# Patient Record
Sex: Male | Born: 1960 | Race: White | Hispanic: No | Marital: Married | Smoking: Current every day smoker
Health system: Southern US, Community
[De-identification: ages and names within clinical notes are randomized; demographics above are authoritative.]

## PROBLEM LIST (undated history)

## (undated) DIAGNOSIS — R42 Dizziness and giddiness: Secondary | ICD-10-CM

## (undated) DIAGNOSIS — M1990 Unspecified osteoarthritis, unspecified site: Secondary | ICD-10-CM

## (undated) DIAGNOSIS — E119 Type 2 diabetes mellitus without complications: Secondary | ICD-10-CM

## (undated) DIAGNOSIS — K219 Gastro-esophageal reflux disease without esophagitis: Secondary | ICD-10-CM

## (undated) DIAGNOSIS — J45909 Unspecified asthma, uncomplicated: Secondary | ICD-10-CM

## (undated) DIAGNOSIS — R079 Chest pain, unspecified: Secondary | ICD-10-CM

## (undated) DIAGNOSIS — E785 Hyperlipidemia, unspecified: Secondary | ICD-10-CM

## (undated) DIAGNOSIS — I219 Acute myocardial infarction, unspecified: Secondary | ICD-10-CM

## (undated) DIAGNOSIS — T7840XA Allergy, unspecified, initial encounter: Secondary | ICD-10-CM

## (undated) HISTORY — DX: Hyperlipidemia, unspecified: E78.5

## (undated) HISTORY — DX: Gastro-esophageal reflux disease without esophagitis: K21.9

## (undated) HISTORY — DX: Acute myocardial infarction, unspecified: I21.9

## (undated) HISTORY — DX: Allergy, unspecified, initial encounter: T78.40XA

## (undated) HISTORY — DX: Unspecified asthma, uncomplicated: J45.909

## (undated) HISTORY — PX: HERNIA REPAIR: SHX51

## (undated) HISTORY — PX: ESOPHAGOGASTRODUODENOSCOPY: SHX1529

## (undated) HISTORY — DX: Unspecified osteoarthritis, unspecified site: M19.90

---

## 2012-07-01 DIAGNOSIS — I219 Acute myocardial infarction, unspecified: Secondary | ICD-10-CM

## 2012-07-01 HISTORY — PX: CORONARY STENT INTERVENTION: CATH118234

## 2012-07-01 HISTORY — PX: CARDIAC CATHETERIZATION: SHX172

## 2012-07-01 HISTORY — DX: Acute myocardial infarction, unspecified: I21.9

## 2013-02-05 ENCOUNTER — Ambulatory Visit: Payer: Self-pay | Admitting: Family Medicine

## 2013-02-19 ENCOUNTER — Ambulatory Visit: Payer: Self-pay | Admitting: Family Medicine

## 2013-02-27 ENCOUNTER — Inpatient Hospital Stay: Payer: Self-pay | Admitting: Specialist

## 2013-02-27 LAB — CBC WITH DIFFERENTIAL/PLATELET
Eosinophil #: 0.4 10*3/uL (ref 0.0–0.7)
Eosinophil %: 3.5 %
HGB: 15.4 g/dL (ref 13.0–18.0)
Lymphocyte #: 4.4 10*3/uL — ABNORMAL HIGH (ref 1.0–3.6)
MCHC: 35.3 g/dL (ref 32.0–36.0)
Neutrophil #: 5.2 10*3/uL (ref 1.4–6.5)
Neutrophil %: 47.6 %
Platelet: 290 10*3/uL (ref 150–440)
RBC: 4.76 10*6/uL (ref 4.40–5.90)
RDW: 12.1 % (ref 11.5–14.5)
WBC: 10.9 10*3/uL — ABNORMAL HIGH (ref 3.8–10.6)

## 2013-02-27 LAB — URINALYSIS, COMPLETE
Bacteria: NONE SEEN
Bilirubin,UR: NEGATIVE
Blood: NEGATIVE
Glucose,UR: NEGATIVE mg/dL (ref 0–75)
Ketone: NEGATIVE
Ph: 6 (ref 4.5–8.0)
Protein: NEGATIVE
Squamous Epithelial: NONE SEEN

## 2013-02-27 LAB — COMPREHENSIVE METABOLIC PANEL
Albumin: 3.9 g/dL (ref 3.4–5.0)
Anion Gap: 11 (ref 7–16)
Bilirubin,Total: 0.4 mg/dL (ref 0.2–1.0)
Chloride: 100 mmol/L (ref 98–107)
Co2: 25 mmol/L (ref 21–32)
Creatinine: 0.98 mg/dL (ref 0.60–1.30)
Glucose: 176 mg/dL — ABNORMAL HIGH (ref 65–99)
Osmolality: 276 (ref 275–301)
Potassium: 3 mmol/L — ABNORMAL LOW (ref 3.5–5.1)
SGOT(AST): 40 U/L — ABNORMAL HIGH (ref 15–37)
Sodium: 136 mmol/L (ref 136–145)
Total Protein: 7.4 g/dL (ref 6.4–8.2)

## 2013-02-27 LAB — LIPID PANEL: Ldl Cholesterol, Calc: 135 mg/dL — ABNORMAL HIGH (ref 0–100)

## 2013-02-27 LAB — TROPONIN I
Troponin-I: <0.02 ng/mL
Troponin-I: 2.2 ng/mL — ABNORMAL HIGH
Troponin-I: 1.7 ng/mL — ABNORMAL HIGH

## 2013-02-27 LAB — PROTIME-INR
INR: 0.9
Prothrombin Time: 12.8 secs (ref 11.5–14.7)

## 2013-02-27 LAB — CK TOTAL AND CKMB (NOT AT ARMC)
CK, Total: 249 U/L — ABNORMAL HIGH (ref 35–232)
CK, Total: 251 U/L — ABNORMAL HIGH (ref 35–232)
CK, Total: 242 U/L — ABNORMAL HIGH (ref 35–232)
CK-MB: 0.6 ng/mL (ref 0.5–3.6)
CK-MB: 4.4 ng/mL — ABNORMAL HIGH (ref 0.5–3.6)
CK-MB: 3.7 ng/mL — ABNORMAL HIGH (ref 0.5–3.6)

## 2013-02-28 LAB — WBC: WBC: 10.2 10*3/uL (ref 3.8–10.6)

## 2013-02-28 LAB — LIPID PANEL
Ldl Cholesterol, Calc: 122 mg/dL — ABNORMAL HIGH (ref 0–100)
VLDL Cholesterol, Calc: 49 mg/dL — ABNORMAL HIGH (ref 5–40)

## 2013-03-02 LAB — BASIC METABOLIC PANEL
Anion Gap: 9 (ref 7–16)
BUN: 13 mg/dL (ref 7–18)
Calcium, Total: 8.8 mg/dL (ref 8.5–10.1)
Co2: 24 mmol/L (ref 21–32)
Creatinine: 0.78 mg/dL (ref 0.60–1.30)
EGFR (African American): >60
EGFR (Non-African Amer.): >60
Osmolality: 268 (ref 275–301)
Potassium: 3.8 mmol/L (ref 3.5–5.1)
Sodium: 134 mmol/L — ABNORMAL LOW (ref 136–145)

## 2014-10-21 NOTE — Discharge Summary (Signed)
PATIENT NAME:  Clifford Morse, Clifford Morse MR#:  [id number] DATE OF BIRTH:  [birth year]  DATE OF ADMISSION:  02/27/2013 DATE OF DISCHARGE:  03/03/2013  For a detailed note, please take a look at the history and physical on admission by Dr. Valentino Nose.   DIAGNOSES AT DISCHARGE: Acute non-Q-wave myocardial infarction. Chest pain secondary to the non-Q-wave myocardial infarction. Hypertension. Hyperlipidemia. Depression. Hypokalemia.   DIET: The patient is being discharged on a low-sodium, low-fat diet.   ACTIVITY: As tolerated.   FOLLOWUP: With Dr. Miquel Dunn in the next 1 to 2 weeks. Also follow up with Dr. Larene Beach in the next 1 to 2  weeks.   DISCHARGE MEDICATIONS: Hydrochlorothiazide/lisinopril 12.5/20 mg 1 tab daily, omeprazole 20 mg daily, atenolol 100 mg daily, Cymbalta 30 mg daily, Brilinta 90 mg b.i.d., atorvastatin 40 mg daily and aspirin 81 mg daily.   CONSULTANTS DURING THE HOSPITAL COURSE: Dr. Miquel Dunn from cardiology.   PERTINENT STUDIES DONE DURING THE HOSPITAL COURSE: A CT scan of the chest done with contrast on admission showing no evidence of acute pulmonary embolism, no mediastinal or hilar lymphadenopathy, no evidence of congestive heart failure. A chest x-ray done showing no evidence of acute cardiopulmonary disease.   A cardiac catheterization done on 03/02/2013 showing an 85% stenosis to the mid LAD, status post drug-eluting stent placement; n 50% stenosis of the distal LAD.   A 2-dimensional echocardiogram done showing ejection fraction to be 60% to 65%, mildly increased left ventricular posterior wall thickness.   HOSPITAL COURSE: This is a 54 year old male with medical problems as mentioned above, presented to the hospital on 02/27/2013 secondary to chest pain.  1.  Non-Q-wave MI:  This was the likely cause of the patient's chest pain, as patient ruled in by cardiac markers while in the hospital. The patient presented to the hospital on August 30, had  first set of  troponins checked which was negative. The second set was as high as 2.2. A cardiology consult was obtained. The patient was started on Lovenox, maintained on some aspirin, beta blockers and also a statin. After seen by cardiology, the patient underwent a cardiac catheterization on the morning of 03/02/2013 which showed a mid LAD 85% lesion, to which a drug-eluting stent was placed. Post stent placement, the patient has been chest pain-free and hemodynamically stable. He was therefore discharged on aspirin, Brilinta, a beta blocker, statin and ACE inhibitor as mentioned with close followup with cardiology as an outpatient.  2.  Hypertension: The patient remained hemodynamically stable. He was maintained on his combination hydrochlorothiazide/lisinopril along with his metoprolol, and he will resume those upon discharge.  3.  Hyperlipidemia: The patient was not on a statin prior to coming in, although due to his acute MI and his lipid panel being grossly abnormal with a total cholesterol over 200 and LDL over 120, he was discharged on atorvastatin as mentioned.  4.  Depression: The patient was maintained on his Cymbalta, and he will resume that upon discharge.   CODE STATUS: The patient is a full code.   TIME SPENT: 40 minutes.   ____________________________ Belia Heman. Verdell Carmine, MD vjs:jm D: 03/03/2013 16:03:41 ET T: 03/03/2013 16:36:40 ET JOB#: [id number]  cc: Belia Heman. Verdell Carmine, MD, <Dictator> Isaias Cowman, MD Arlis Porta., MD Henreitta Leber MD ELECTRONICALLY SIGNED 03/13/2013 15:09

## 2014-10-21 NOTE — Consult Note (Signed)
PATIENT NAME:  Clifford Morse, Clifford Morse MR#:  [id number] DATE OF BIRTH:  1960/11/11  DATE OF CONSULTATION:  02/27/2013  CONSULTING PHYSICIAN:  Isaias Cowman, MD  PRIMARY CARE PHYSICIAN: Arlis Porta., MD  CHIEF COMPLAINT: Chest pain.   HISTORY OF PRESENT ILLNESS: The patient is a 54 year old gentleman with history of hypertension and tobacco abuse. The patient has known gastroesophageal reflux disease, currently on omeprazole. The patient has had a 2- to 3-week history of intermittent episodes of upper chest discomfort which appear to be related to meals. On 02/26/2013, the patient had dull upper chest pain after eating toast. The patient took an omeprazole without relief and presented to [facility] Emergency Room. EKG was normal sinus rhythm, normal ECG. The patient has ruled out for myocardial infarction with negative troponin and cardiac isoenzymes. The patient is currently chest pain-free.   PAST MEDICAL HISTORY:  1. Hypertension.  2. Gastroesophageal reflux disease.  3. Fatty liver. 4. Depression.   MEDICATIONS:  1. Lisinopril/HCTZ 10/12.5 mg daily.  2. Atenolol 100 mg daily. 3. Prilosec 20 mg daily.  4. Duloxetine 30 mg daily.   SOCIAL HISTORY: The patient is married. He smokes a pack of cigarettes a day.   FAMILY HISTORY: Sister status post MI at age 53.   REVIEW OF SYSTEMS:   CONSTITUTIONAL: No fever or chills.  EYES: No blurry vision.  EARS: No hearing loss.  RESPIRATORY: No shortness of breath.  CARDIOVASCULAR: Chest pain as described above.  GASTROINTESTINAL: No nausea, vomiting or diarrhea.  GENITOURINARY: No dysuria or hematuria.  ENDOCRINE: No polyuria or polydipsia.  MUSCULOSKELETAL: No arthralgias or myalgias.  NEUROLOGICAL: No focal muscle weakness or numbness.  PSYCHOLOGICAL: No depression or anxiety.   PHYSICAL EXAMINATION:  VITAL SIGNS: Blood pressure 124/78, pulse 70, respirations 20, temperature 98.8, pulse oximetry 97%.  HEENT: Pupils equally reactive to  light and accommodation.  NECK: Supple without thyromegaly.  LUNGS: Clear.  CARDIOVASCULAR: Normal JVP. Normal PMI. Regular rate and rhythm. Normal S1, S2. No appreciable gallop, murmur or rub.  ABDOMEN: Soft and nontender. Pulses were intact bilaterally.  MUSCULOSKELETAL: Normal muscle tone.  NEUROLOGICAL: The patient is alert and oriented x3. Motor and sensory both grossly intact.   IMPRESSION: A 54 year old gentleman with chest pain with atypical features, which sounds gastrointestinal in nature. The patient has ruled out for myocardial infarction by CPK, isoenzymes and troponin. EKG is normal.   RECOMMENDATIONS:  1. Agree with overall current therapy.  2. Would defer full-dose anticoagulation.  3. May proceed with outpatient cardiac workup.   ____________________________ Isaias Cowman, MD ap:OSi D: 02/27/2013 08:59:47 ET T: 02/27/2013 09:30:43 ET JOB#: [id number]  cc: Isaias Cowman, MD, <Dictator> Isaias Cowman MD ELECTRONICALLY SIGNED 03/11/2013 7:59

## 2014-10-21 NOTE — H&P (Signed)
PATIENT NAME:  Clifford Morse, Clifford Morse MR#:  [id number] DATE OF BIRTH:  01-06-1961  DATE OF ADMISSION:  02/27/2013  PRIMARY CARE PHYSICIAN: Arlis Porta., MD  REFERRING PHYSICIAN: Valli Glance. Owens Shark, MD  HISTORY OF PRESENT ILLNESS: Clifford Morse is a 54 year old Caucasian gentleman with past medical history of hypertension, hyperlipidemia, hepatic steatosis, tobacco abuse of 15 pack-years, gastroesophageal reflux disease, presenting with retrosternal chest pain. He has been having episodes of intermittent retrosternal chest pain described as a dull ache for the last 1 month on exertion, relieved by rest as well as belching. However, today, he had similar pain at rest after he ate some toast, pain 4 to 5 in intensity, which he described as a dull ache with radiation to the back, lasting approximately 1 hour, relieved with nitroglycerin by EMS. Pain has resolved completely by presentation to the Emergency Department. This episode was associated with diaphoresis and shortness of breath. Currently, in the ED, all symptoms have resolved. However, on exam, noted a difference in bilateral upper extremity blood pressure, left 160/84, right 138/37. CT performed in the Emergency Department to rule out aortic dissection.  REVIEW OF SYSTEMS:  CONSTITUTIONAL: Denies any fevers, chills, weight changes.  EYES: Denies any vision changes. ENT: Mouth: No oral lesions or ulcers.  CARDIOVASCULAR: As described above.  RESPIRATORY: Shortness of breath as described above. Denies any cough.  GASTROINTESTINAL: Denies any nausea, vomiting, diarrhea, constipation. GENITOURINARY: Denies any dysuria or increased urinary frequency.  MUSCULOSKELETAL: Denies any weakness or pain. SKIN: Denies any lesions or rash.  NEUROLOGICALLY: No headache, paresthesias or paralysis.  PSYCHIATRIC: No depressive symptoms or anxiety.  ENDOCRINE: No heat or cold intolerance. No nocturia. No thyroid problems. HEMATOLOGIC AND LYMPHATIC: No increased  bleeding or easy bruisability.  ALLERGY AND IMMUNOLOGY: No active symptoms.  Otherwise, full review of systems performed and is negative.  PAST MEDICAL HISTORY:  1. Hypertension.  2. GERD. 3. Hepatic steatosis. 4. Depression.   ALLERGIES: No known drug allergies.   FAMILY HISTORY: Significant for sister with myocardial infarction at age 64.   SOCIAL HISTORY: Current everyday smoker with 15-pack-year history. Occasional alcohol usage.   HOME MEDICATIONS: Include:  1. Lisinopril/hydrochlorothiazide 10/12.5 mg p.o. daily. 2. Prilosec 20 mg p.o. daily. 3. Duloxetine 30 mg p.o. daily.  4. Atenolol 100 mg p.o. daily   PHYSICAL EXAMINATION:  VITAL SIGNS: Temperature 98, pulse 72, respirations 14, blood pressure 156/88. This was repeated in the left arm 160/84, right arm 139/87, pulse oximetry 100% on 2 liters nasal cannula. GENERAL: In no acute distress, awake, alert and oriented x3.  HEENT: Normocephalic, atraumatic. Extraocular muscles intact. Pupils equal, round and reactive to light as well as accommodation. Moist mucosal membranes.  NECK: No thyromegaly noted.  CARDIOVASCULAR: S1, S2, regular rate and rhythm. No murmurs, rubs or gallops. Chest is nontender to palpation.  PULMONARY: Clear to auscultation bilaterally without wheezes, rubs or rhonchi.  ABDOMEN: Obese, soft, nontender, nondistended, with positive bowel sounds.  EXTREMITIES: Reveal no cyanosis, edema or clubbing.  NEUROLOGICALLY: Cranial nerves II through XII intact. No gross neurological deficits.   LABORATORY DATA: Sodium 136, potassium 3, chloride 100, bicarbonate 25, BUN 13, creatinine 0.98, glucose 176, total protein 7.4, albumin 3.9, bilirubin 0.4, alkaline phosphatase 104, AST 40, ALT 73. CK 249, CK-MB 0.6, troponin I less than 0.02. WBC 10.9, hemoglobin 15.4, platelets 290. INR is 0.9. Chest x-ray: No acute cardiopulmonary process. CT angio of the chest: No dissection or aneurysm, though there is mention of coronary  artery atherosclerotic  calcification as well as hepatic steatosis. EKG: Normal sinus rhythm with heart rate of 80. No ST or T abnormalities.   ASSESSMENT AND PLAN: This is a 54 year old Caucasian gentleman with past medical history of hypertension, hepatic steatosis, tobacco abuse for 15 years as well as gastroesophageal reflux disease, presenting with retrosternal chest pain. He has been having intermittent episodes of retrosternal chest pain with exertion, described as a dull ache, for the last 1 month. He walks approximately 100 feet before symptom onset, and it resolves after a few minutes of rest. However, today, the day of admission, he had an episode at rest after eating, and pain was 4 to 5 out of 10 in intensity with radiation to the back, lasting 1 hour, with associated diaphoresis and shortness of breath. Currently, EKG as well as cardiac enzymes revealing no abnormalities.   1. Unstable angina. Given nitroglycerin and aspirin by EMS. EKG in the Emergency Department is normal sinus rhythm at 80 with no ST or T abnormalities. Initial enzymes are negative. He is going to be admitted to observation with cardiac telemetry, trending cardiac enzymes. He has been given a statin and beta blocker. Lipid panel was ordered plus cardiology consult.  2. Hypokalemia. Potassium replaced. 3. Hypertension. Resume his home medications, though opting to change atenolol to metoprolol.  4. Hepatic steatosis which is chronic in nature.  5. Gastroesophageal reflux disease. Continue Prilosec.  6. Depression. Continue duloxetine.   CODE STATUS: The patient is full code.  TIME SPENT: 45 minutes.   ____________________________ Aaron Mose. Cherisse Carrell, MD dkh:OSi D: 02/27/2013 05:03:19 ET T: 02/27/2013 06:09:43 ET JOB#: [id number]  cc: Aaron Mose. Nikeia Henkes, MD, <Dictator> Dula Havlik Woodfin Ganja MD ELECTRONICALLY SIGNED 02/27/2013 21:11

## 2015-05-19 DIAGNOSIS — K219 Gastro-esophageal reflux disease without esophagitis: Secondary | ICD-10-CM | POA: Insufficient documentation

## 2015-05-19 DIAGNOSIS — K760 Fatty (change of) liver, not elsewhere classified: Secondary | ICD-10-CM | POA: Insufficient documentation

## 2015-05-19 DIAGNOSIS — Z9889 Other specified postprocedural states: Secondary | ICD-10-CM | POA: Insufficient documentation

## 2015-05-19 DIAGNOSIS — I10 Essential (primary) hypertension: Secondary | ICD-10-CM | POA: Insufficient documentation

## 2015-06-20 ENCOUNTER — Ambulatory Visit (INDEPENDENT_AMBULATORY_CARE_PROVIDER_SITE_OTHER): Payer: Self-pay | Admitting: Family Medicine

## 2015-06-20 ENCOUNTER — Encounter: Payer: Self-pay | Admitting: Family Medicine

## 2015-06-20 VITALS — BP 132/72 | HR 63 | Resp 16 | Ht 69.0 in | Wt 185.0 lb

## 2015-06-20 DIAGNOSIS — Z0000 Encounter for general adult medical examination without abnormal findings: Secondary | ICD-10-CM

## 2015-06-20 DIAGNOSIS — F32A Depression, unspecified: Secondary | ICD-10-CM

## 2015-06-20 DIAGNOSIS — I10 Essential (primary) hypertension: Secondary | ICD-10-CM

## 2015-06-20 DIAGNOSIS — F419 Anxiety disorder, unspecified: Secondary | ICD-10-CM

## 2015-06-20 DIAGNOSIS — K760 Fatty (change of) liver, not elsewhere classified: Secondary | ICD-10-CM

## 2015-06-20 DIAGNOSIS — F331 Major depressive disorder, recurrent, moderate: Secondary | ICD-10-CM | POA: Insufficient documentation

## 2015-06-20 DIAGNOSIS — Z8679 Personal history of other diseases of the circulatory system: Secondary | ICD-10-CM

## 2015-06-20 DIAGNOSIS — K219 Gastro-esophageal reflux disease without esophagitis: Secondary | ICD-10-CM

## 2015-06-20 DIAGNOSIS — Z720 Tobacco use: Secondary | ICD-10-CM | POA: Insufficient documentation

## 2015-06-20 DIAGNOSIS — F17200 Nicotine dependence, unspecified, uncomplicated: Secondary | ICD-10-CM

## 2015-06-20 DIAGNOSIS — F329 Major depressive disorder, single episode, unspecified: Secondary | ICD-10-CM

## 2015-06-20 MED ORDER — LOVASTATIN 40 MG PO TABS: 40.0000 mg | ORAL_TABLET | Freq: Every day | ORAL | Status: DC

## 2015-06-20 MED ORDER — FLUOXETINE HCL 20 MG PO CAPS: 20.0000 mg | ORAL_CAPSULE | Freq: Every day | ORAL | Status: DC

## 2015-06-20 MED ORDER — ATENOLOL 100 MG PO TABS: 100.0000 mg | ORAL_TABLET | Freq: Every day | ORAL | Status: DC

## 2015-06-20 MED ORDER — LISINOPRIL-HYDROCHLOROTHIAZIDE 10-12.5 MG PO TABS: 1.0000 | ORAL_TABLET | Freq: Every day | ORAL | Status: DC

## 2015-06-20 MED ORDER — CLOPIDOGREL BISULFATE 75 MG PO TABS: 75.0000 mg | ORAL_TABLET | Freq: Every day | ORAL | Status: DC

## 2015-06-20 NOTE — Progress Notes (Signed)
Name: Clifford Morse   MRN: [id number]    DOB: [birth year]   Date:06/20/2015       Progress Note  Subjective  Chief Complaint  Chief Complaint  Patient presents with  . Annual Exam    depression PHQ9-6    HPI Here for annual physical exam.  Has not kept f/u in > 1 yr.  Saw Dr. Lillie Fragmin about 1 month ago.  Advised to stop smoking.  He put him back on Lovastation.  He has had some SOB.  He is more irritable and loses temper easily.  Anxious.    No problem-specific assessment & plan notes found for this encounter.   Past Medical History  Diagnosis Date  . Myocardial infarction ([place]) 2014  . Allergy   . Arthritis   . Asthma   . Depression   . GERD (gastroesophageal reflux disease)   . Hyperlipidemia   . Hypertension     History reviewed. No pertinent past surgical history.  Family History  Problem Relation Age of Onset  . Diabetes Mother   . Emphysema Father     Social History   Social History  . Marital Status: Married    Spouse Name: N/A  . Number of Children: N/A  . Years of Education: N/A   Occupational History  . Not on file.   Social History Main Topics  . Smoking status: Current Every Day Smoker -- 1.00 packs/day for 15 years    Types: Cigarettes  . Smokeless tobacco: Not on file  . Alcohol Use: No  . Drug Use: No  . Sexual Activity: Not on file   Other Topics Concern  . Not on file   Social History Narrative  . No narrative on file     Current outpatient prescriptions:  .  aspirin 81 MG tablet, Take 81 mg by mouth daily., Disp: , Rfl:  .  atenolol (TENORMIN) 100 MG tablet, Take 1 tablet (100 mg total) by mouth daily., Disp: 90 tablet, Rfl: 3 .  clopidogrel (PLAVIX) 75 MG tablet, Take 1 tablet (75 mg total) by mouth daily., Disp: 90 tablet, Rfl: 3 .  Fish Oil-Cholecalciferol (FISH OIL + D3 PO), Take by mouth., Disp: , Rfl:  .  lisinopril-hydrochlorothiazide (PRINZIDE,ZESTORETIC) 10-12.5 MG tablet, Take 1 tablet by mouth daily., Disp: 90 tablet,  Rfl: 3 .  lovastatin (MEVACOR) 40 MG tablet, Take 1 tablet (40 mg total) by mouth at bedtime., Disp: 90 tablet, Rfl: 3 .  omeprazole (PRILOSEC) 10 MG capsule, Take 10 mg by mouth daily., Disp: , Rfl:  .  FLUoxetine (PROZAC) 20 MG capsule, Take 1 capsule (20 mg total) by mouth daily., Disp: 30 capsule, Rfl: 6  Not on File   Review of Systems  Constitutional: Negative for fever, chills, weight loss and malaise/fatigue.  HENT: Positive for congestion. Negative for hearing loss.   Eyes: Negative for blurred vision and double vision.  Respiratory: Positive for cough, shortness of breath and wheezing.   Cardiovascular: Negative for chest pain, palpitations and leg swelling.  Gastrointestinal: Positive for heartburn (rare). Negative for nausea, vomiting, abdominal pain, diarrhea and blood in stool.  Genitourinary: Negative for dysuria, urgency and frequency.  Musculoskeletal: Negative for myalgias and joint pain.  Skin: Negative for rash.  Neurological: Positive for tremors. Negative for dizziness, weakness and headaches.  Psychiatric/Behavioral: The patient is nervous/anxious.       Objective  Filed Vitals:   06/20/15 1434  BP: 132/72  Pulse: 63  Resp: 16  Height: 5\' 9"  (  1.753 m)  Weight: 185 lb (83.915 kg)    Physical Exam  Constitutional: He is oriented to person, place, and time and well-developed, well-nourished, and in no distress. No distress.  HENT:  Head: Normocephalic and atraumatic.  Right Ear: External ear normal.  Left Ear: External ear normal.  Nose: Nose normal.  Mouth/Throat: Oropharynx is clear and moist.  Eyes: Conjunctivae and EOM are normal. Pupils are equal, round, and reactive to light. No scleral icterus.  Neck: Normal range of motion. Neck supple. Carotid bruit is not present. No thyromegaly present.  Cardiovascular: Normal rate, regular rhythm, normal heart sounds and intact distal pulses.  Exam reveals no gallop and no friction rub.   No murmur  heard. Pulmonary/Chest: Effort normal and breath sounds normal. No respiratory distress. He has no wheezes. He has no rales.  Abdominal: Soft. Bowel sounds are normal. He exhibits no distension, no abdominal bruit and no mass. There is no tenderness.  Musculoskeletal: He exhibits no edema.  Lymphadenopathy:    He has no cervical adenopathy.  Neurological: He is alert and oriented to person, place, and time. No cranial nerve deficit. Coordination normal.  Skin: Skin is warm and dry.  Psychiatric:  Affect mildly anxious  Vitals reviewed.      No results found for this or any previous visit (from the past 2160 hour(s)).   Assessment & Plan  Problem List Items Addressed This Visit      Cardiovascular and Mediastinum   BP (high blood pressure)   Relevant Medications   aspirin 81 MG tablet   atenolol (TENORMIN) 100 MG tablet   lisinopril-hydrochlorothiazide (PRINZIDE,ZESTORETIC) 10-12.5 MG tablet   lovastatin (MEVACOR) 40 MG tablet   Other Relevant Orders   Comprehensive Metabolic Panel (CMET)     Digestive   Fatty infiltration of liver   Relevant Orders   Comprehensive Metabolic Panel (CMET)   Acid reflux   Relevant Medications   omeprazole (PRILOSEC) 10 MG capsule   Other Relevant Orders   CBC with Differential     Other   Clinical depression   Relevant Medications   FLUoxetine (PROZAC) 20 MG capsule   Annual physical exam - Primary   Smoker   Relevant Orders   Nurse to provide smoking / tobacco cessation education   History of ASCVD   Relevant Medications   atenolol (TENORMIN) 100 MG tablet   clopidogrel (PLAVIX) 75 MG tablet   lovastatin (MEVACOR) 40 MG tablet   Other Relevant Orders   Lipid Profile   Acute anxiety   Relevant Medications   FLUoxetine (PROZAC) 20 MG capsule      Meds ordered this encounter  Medications  . DISCONTD: amoxicillin (AMOXIL) 875 MG tablet    Sig: Take by mouth.  . DISCONTD: atenolol (TENORMIN) 100 MG tablet    Sig: Take by  mouth.  . DISCONTD: lansoprazole (PREVACID) 15 MG capsule    Sig: Take by mouth.  . DISCONTD: lisinopril-hydrochlorothiazide (PRINZIDE,ZESTORETIC) 10-12.5 MG tablet    Sig: Take by mouth.  . DISCONTD: lovastatin (MEVACOR) 40 MG tablet    Sig: Take by mouth.  . DISCONTD: clopidogrel (PLAVIX) 75 MG tablet    Sig: TAKE ONE TABLET BY MOUTH ONCE DAILY *  START  WHEN  BRILINTA  RUNS  OUT*  . aspirin 81 MG tablet    Sig: Take 81 mg by mouth daily.  Marland Kitchen omeprazole (PRILOSEC) 10 MG capsule    Sig: Take 10 mg by mouth daily.  . Fish Oil-Cholecalciferol (FISH OIL +  D3 PO)    Sig: Take by mouth.  Marland Kitchen FLUoxetine (PROZAC) 20 MG capsule    Sig: Take 1 capsule (20 mg total) by mouth daily.    Dispense:  30 capsule    Refill:  6  . atenolol (TENORMIN) 100 MG tablet    Sig: Take 1 tablet (100 mg total) by mouth daily.    Dispense:  90 tablet    Refill:  3  . clopidogrel (PLAVIX) 75 MG tablet    Sig: Take 1 tablet (75 mg total) by mouth daily.    Dispense:  90 tablet    Refill:  3  . lisinopril-hydrochlorothiazide (PRINZIDE,ZESTORETIC) 10-12.5 MG tablet    Sig: Take 1 tablet by mouth daily.    Dispense:  90 tablet    Refill:  3  . lovastatin (MEVACOR) 40 MG tablet    Sig: Take 1 tablet (40 mg total) by mouth at bedtime.    Dispense:  90 tablet    Refill:  3   1. Annual physical exam   2. Smoker  - Nurse to provide smoking / tobacco cessation education  3. Essential hypertension  - Comprehensive Metabolic Panel (CMET) - lisinopril-hydrochlorothiazide (PRINZIDE,ZESTORETIC) 10-12.5 MG tablet; Take 1 tablet by mouth daily.  Dispense: 90 tablet; Refill: 3  4. Fatty infiltration of liver  - Comprehensive Metabolic Panel (CMET)  5. Gastroesophageal reflux disease without esophagitis  - CBC with Differential  6. History of ASCVD  - Lipid Profile - atenolol (TENORMIN) 100 MG tablet; Take 1 tablet (100 mg total) by mouth daily.  Dispense: 90 tablet; Refill: 3 - clopidogrel (PLAVIX) 75 MG  tablet; Take 1 tablet (75 mg total) by mouth daily.  Dispense: 90 tablet; Refill: 3 - lovastatin (MEVACOR) 40 MG tablet; Take 1 tablet (40 mg total) by mouth at bedtime.  Dispense: 90 tablet; Refill: 3  7. Clinical depression  - FLUoxetine (PROZAC) 20 MG capsule; Take 1 capsule (20 mg total) by mouth daily.  Dispense: 30 capsule; Refill: 6  8. Acute anxiety  - FLUoxetine (PROZAC) 20 MG capsule; Take 1 capsule (20 mg total) by mouth daily.  Dispense: 30 capsule; Refill: 6

## 2015-06-20 NOTE — Patient Instructions (Signed)
Discussed smoking cessation in some detail.

## 2015-06-23 LAB — CBC WITH DIFFERENTIAL/PLATELET
BASOS: 1 %
Basophils Absolute: 0.1 10*3/uL (ref 0.0–0.2)
EOS (ABSOLUTE): 0.3 10*3/uL (ref 0.0–0.4)
EOS: 3 %
HEMATOCRIT: 47.2 % (ref 37.5–51.0)
HEMOGLOBIN: 17.1 g/dL (ref 12.6–17.7)
Immature Grans (Abs): 0 10*3/uL (ref 0.0–0.1)
Immature Granulocytes: 0 %
LYMPHS: 33 %
Lymphocytes Absolute: 3.3 10*3/uL — ABNORMAL HIGH (ref 0.7–3.1)
MCH: 32.4 pg (ref 26.6–33.0)
MCHC: 36.2 g/dL — AB (ref 31.5–35.7)
MCV: 89 fL (ref 79–97)
MONOS ABS: 0.6 10*3/uL (ref 0.1–0.9)
Monocytes: 6 %
NEUTROS ABS: 5.8 10*3/uL (ref 1.4–7.0)
NEUTROS PCT: 57 %
Platelets: 280 10*3/uL (ref 150–379)
RBC: 5.28 x10E6/uL (ref 4.14–5.80)
RDW: 12.7 % (ref 12.3–15.4)
WBC: 10.1 10*3/uL (ref 3.4–10.8)

## 2015-06-24 LAB — COMPREHENSIVE METABOLIC PANEL
ALK PHOS: 97 IU/L (ref 39–117)
ALT: 62 IU/L — ABNORMAL HIGH (ref 0–44)
AST: 41 IU/L — AB (ref 0–40)
Albumin/Globulin Ratio: 1.8 (ref 1.1–2.5)
Albumin: 4.8 g/dL (ref 3.5–5.5)
BUN/Creatinine Ratio: 14 (ref 9–20)
BUN: 11 mg/dL (ref 6–24)
Bilirubin Total: 0.7 mg/dL (ref 0.0–1.2)
CO2: 24 mmol/L (ref 18–29)
CREATININE: 0.77 mg/dL (ref 0.76–1.27)
Calcium: 9.9 mg/dL (ref 8.7–10.2)
Chloride: 98 mmol/L (ref 96–106)
GFR calc Af Amer: 118 mL/min/{1.73_m2} (ref >59)
GFR calc non Af Amer: 102 mL/min/{1.73_m2} (ref >59)
GLUCOSE: 86 mg/dL (ref 65–99)
Globulin, Total: 2.6 g/dL (ref 1.5–4.5)
Potassium: 5.4 mmol/L — ABNORMAL HIGH (ref 3.5–5.2)
SODIUM: 139 mmol/L (ref 134–144)
Total Protein: 7.4 g/dL (ref 6.0–8.5)

## 2015-06-24 LAB — LIPID PANEL
CHOLESTEROL TOTAL: 163 mg/dL (ref 100–199)
Chol/HDL Ratio: 4 ratio units (ref 0.0–5.0)
HDL: 41 mg/dL (ref >39)
LDL CALC: 99 mg/dL (ref 0–99)
TRIGLYCERIDES: 117 mg/dL (ref 0–149)
VLDL CHOLESTEROL CAL: 23 mg/dL (ref 5–40)

## 2015-06-28 ENCOUNTER — Other Ambulatory Visit: Payer: Self-pay | Admitting: Family Medicine

## 2015-06-28 DIAGNOSIS — E875 Hyperkalemia: Secondary | ICD-10-CM

## 2015-06-28 LAB — SPECIMEN STATUS REPORT

## 2015-06-28 LAB — HEPATITIS C ANTIBODY

## 2015-06-30 ENCOUNTER — Telehealth: Payer: Self-pay

## 2015-06-30 MED ORDER — ALPRAZOLAM 0.5 MG PO TABS: 0.5000 mg | ORAL_TABLET | Freq: Two times a day (BID) | ORAL | Status: DC | PRN

## 2015-06-30 NOTE — Telephone Encounter (Signed)
Patient states Dr. Luan Pulling has given Xanax in the past. He just restarted Fluoxetine and is experiencing more anxiety. Amy has greed to give #30 on Xanax in absence of Dr. Luan Pulling.

## 2015-06-30 NOTE — Telephone Encounter (Addendum)
Patient called to get clarification on labs. Has had panic attack worrying about Liver Enzymes and Potassium elevation. I explained that the elevated potassium can result from [place]. I also assured him that 5.4 is not a danger zone. I went over the ranges. I expalined HEP C is a test we do as protocol when liver enzymes are up but it is most likely fatty liver which we treat with diet. He feels much better and we will recheck BMP in 1 week. He is out of his Xanax and would like a refill for weekend if possible. I explained Dr.Hawkins is out of office and he may need to wait. I will call him back with answer.

## 2015-06-30 NOTE — Telephone Encounter (Signed)
It looks like Dr. Lillie Fragmin wrote him a prescription for Xanax on 05/19/15 per the [facility] CSRS. It is his only prescription in the past 6 mos.  I looked at the note in Care Everywhere I am not sure why it was written. If Dr. Luan Pulling has given it to him before, I am okay with doing a week supply or so to help him get through until he can speak to Dr. Luan Pulling. However if we have never prescribed it, I do not feel comfortable giving him that medication over the phone. Thank you! AK

## 2015-06-30 NOTE — Addendum Note (Signed)
Addended byVincenza Hews, Little Bashore L on: 06/30/2015 09:36 AM   Modules accepted: Orders

## 2015-08-03 ENCOUNTER — Ambulatory Visit (INDEPENDENT_AMBULATORY_CARE_PROVIDER_SITE_OTHER): Payer: Self-pay | Admitting: Family Medicine

## 2015-08-03 ENCOUNTER — Encounter: Payer: Self-pay | Admitting: Family Medicine

## 2015-08-03 VITALS — BP 130/76 | HR 80 | Temp 98.5°F | Resp 16 | Ht 69.0 in | Wt 172.0 lb

## 2015-08-03 DIAGNOSIS — I10 Essential (primary) hypertension: Secondary | ICD-10-CM

## 2015-08-03 DIAGNOSIS — Z87891 Personal history of nicotine dependence: Secondary | ICD-10-CM

## 2015-08-03 DIAGNOSIS — Z8679 Personal history of other diseases of the circulatory system: Secondary | ICD-10-CM

## 2015-08-03 DIAGNOSIS — F329 Major depressive disorder, single episode, unspecified: Secondary | ICD-10-CM

## 2015-08-03 DIAGNOSIS — F32A Depression, unspecified: Secondary | ICD-10-CM

## 2015-08-03 NOTE — Progress Notes (Signed)
Name: Clifford Morse   MRN: [id number]    DOB: 09-23-1960   Date:08/03/2015       Progress Note  Subjective  Chief Complaint  Chief Complaint  Patient presents with  . Hypertension    Follow up    HPI  Here for f/u of HBP.  Has ASCVD.  Sees Dr. Josefa Half.  Also with depression with anxiety.  Only rarely takes Xanax niow.  No CP.  Overall feeling well.   No problem-specific assessment & plan notes found for this encounter.   Past Medical History  Diagnosis Date  . Myocardial infarction ([place]) 2014  . Allergy   . Arthritis   . Asthma   . Depression   . GERD (gastroesophageal reflux disease)   . Hyperlipidemia   . Hypertension     History reviewed. No pertinent past surgical history.  Family History  Problem Relation Age of Onset  . Diabetes Mother   . Emphysema Father     Social History   Social History  . Marital Status: Married    Spouse Name: N/A  . Number of Children: N/A  . Years of Education: N/A   Occupational History  . Not on file.   Social History Main Topics  . Smoking status: Current Every Day Smoker -- 1.00 packs/day for 15 years    Types: Cigarettes  . Smokeless tobacco: Not on file  . Alcohol Use: No  . Drug Use: No  . Sexual Activity: Not on file   Other Topics Concern  . Not on file   Social History Narrative     Current outpatient prescriptions:  .  ALPRAZolam (XANAX) 0.5 MG tablet, Take 1 tablet (0.5 mg total) by mouth 2 (two) times daily as needed for anxiety. (Patient taking differently: Take 0.5 mg by mouth 2 (two) times daily as needed for anxiety (Only taking rare prn now.). ), Disp: 30 tablet, Rfl: 0 .  aspirin 81 MG tablet, Take 81 mg by mouth daily., Disp: , Rfl:  .  atenolol (TENORMIN) 100 MG tablet, Take 1 tablet (100 mg total) by mouth daily., Disp: 90 tablet, Rfl: 3 .  clopidogrel (PLAVIX) 75 MG tablet, Take 1 tablet (75 mg total) by mouth daily., Disp: 90 tablet, Rfl: 3 .  Fish Oil-Cholecalciferol (FISH OIL + D3 PO), Take  by mouth., Disp: , Rfl:  .  FLUoxetine (PROZAC) 20 MG capsule, Take 1 capsule (20 mg total) by mouth daily., Disp: 30 capsule, Rfl: 6 .  lisinopril-hydrochlorothiazide (PRINZIDE,ZESTORETIC) 10-12.5 MG tablet, Take 1 tablet by mouth daily., Disp: 90 tablet, Rfl: 3 .  lovastatin (MEVACOR) 40 MG tablet, Take 1 tablet (40 mg total) by mouth at bedtime., Disp: 90 tablet, Rfl: 3 .  omeprazole (PRILOSEC) 10 MG capsule, Take 10 mg by mouth daily., Disp: , Rfl:   Not on File   Review of Systems  Constitutional: Negative for fever, chills, weight loss and malaise/fatigue.  HENT: Negative for hearing loss.   Eyes: Negative for blurred vision and double vision.  Respiratory: Negative for cough, shortness of breath and wheezing.   Cardiovascular: Negative for chest pain, palpitations and leg swelling.  Gastrointestinal: Negative for heartburn, abdominal pain and blood in stool.  Genitourinary: Negative for dysuria, urgency and frequency.  Skin: Negative for rash.  Neurological: Negative for tremors, weakness and headaches.  Psychiatric/Behavioral: Negative for depression. The patient is not nervous/anxious and does not have insomnia.       Objective  Filed Vitals:   08/03/15 1551  BP: 130/76  Pulse: 80  Temp: 98.5 F (36.9 C)  TempSrc: Oral  Resp: 16  Height: 5\' 9"  (1.753 m)  Weight: 172 lb (78.019 kg)    Physical Exam  Constitutional: He is oriented to person, place, and time and well-developed, well-nourished, and in no distress. No distress.  HENT:  Head: Normocephalic and atraumatic.  Eyes: Conjunctivae and EOM are normal. Pupils are equal, round, and reactive to light. No scleral icterus.  Neck: Normal range of motion. Neck supple. Carotid bruit is not present. No thyromegaly present.  Cardiovascular: Normal rate, regular rhythm and normal heart sounds.  Exam reveals no gallop and no friction rub.   No murmur heard. Pulmonary/Chest: Effort normal and breath sounds normal. No  respiratory distress. He has no wheezes. He has no rales.  Abdominal: Soft. Bowel sounds are normal. He exhibits no distension, no abdominal bruit and no mass. There is no tenderness.  Musculoskeletal: He exhibits no edema.  Lymphadenopathy:    He has no cervical adenopathy.  Neurological: He is alert and oriented to person, place, and time.  Vitals reviewed.      Recent Results (from the past 2160 hour(s))  Comprehensive Metabolic Panel (CMET)     Status: Abnormal   Collection Time: 06/23/15  9:33 AM  Result Value Ref Range   Glucose 86 65 - 99 mg/dL   BUN 11 6 - 24 mg/dL   Creatinine, Ser 0.77 0.76 - 1.27 mg/dL   GFR calc non Af Amer 102 >59 mL/min/1.73   GFR calc Af Amer 118 >59 mL/min/1.73   BUN/Creatinine Ratio 14 9 - 20   Sodium 139 134 - 144 mmol/L   Potassium 5.4 (H) 3.5 - 5.2 mmol/L   Chloride 98 96 - 106 mmol/L   CO2 24 18 - 29 mmol/L   Calcium 9.9 8.7 - 10.2 mg/dL   Total Protein 7.4 6.0 - 8.5 g/dL   Albumin 4.8 3.5 - 5.5 g/dL   Globulin, Total 2.6 1.5 - 4.5 g/dL   Albumin/Globulin Ratio 1.8 1.1 - 2.5   Bilirubin Total 0.7 0.0 - 1.2 mg/dL   Alkaline Phosphatase 97 39 - 117 IU/L   AST 41 (H) 0 - 40 IU/L   ALT 62 (H) 0 - 44 IU/L  Lipid Profile     Status: None   Collection Time: 06/23/15  9:33 AM  Result Value Ref Range   Cholesterol, Total 163 100 - 199 mg/dL   Triglycerides 117 0 - 149 mg/dL   HDL 41 >39 mg/dL   VLDL Cholesterol Cal 23 5 - 40 mg/dL   LDL Calculated 99 0 - 99 mg/dL   Chol/HDL Ratio 4.0 0.0 - 5.0 ratio units    Comment:                                   T. Chol/HDL Ratio                                             Men  Women                               1/2 Avg.Risk  3.4    3.3  Avg.Risk  5.0    4.4                                2X Avg.Risk  9.6    7.1                                3X Avg.Risk 23.4   11.0   CBC with Differential     Status: Abnormal   Collection Time: 06/23/15  9:33 AM  Result Value Ref  Range   WBC 10.1 3.4 - 10.8 x10E3/uL   RBC 5.28 4.14 - 5.80 x10E6/uL   Hemoglobin 17.1 12.6 - 17.7 g/dL   Hematocrit 47.2 37.5 - 51.0 %   MCV 89 79 - 97 fL   MCH 32.4 26.6 - 33.0 pg   MCHC 36.2 (H) 31.5 - 35.7 g/dL   RDW 12.7 12.3 - 15.4 %   Platelets 280 150 - 379 x10E3/uL   Neutrophils 57 %   Lymphs 33 %   Monocytes 6 %   Eos 3 %   Basos 1 %   Neutrophils Absolute 5.8 1.4 - 7.0 x10E3/uL   Lymphocytes Absolute 3.3 (H) 0.7 - 3.1 x10E3/uL   Monocytes Absolute 0.6 0.1 - 0.9 x10E3/uL   EOS (ABSOLUTE) 0.3 0.0 - 0.4 x10E3/uL   Basophils Absolute 0.1 0.0 - 0.2 x10E3/uL   Immature Granulocytes 0 %   Immature Grans (Abs) 0.0 0.0 - 0.1 x10E3/uL  Hepatitis C antibody     Status: None   Collection Time: 06/23/15  9:33 AM  Result Value Ref Range   Hep C Virus Ab <0.1 0.0 - 0.9 s/co ratio    Comment:                                   Negative:     < 0.8                              Indeterminate: 0.8 - 0.9                                   Positive:     > 0.9  The CDC recommends that a positive HCV antibody result  be followed up with a HCV Nucleic Acid Amplification  test [id number]).   Specimen status report     Status: None   Collection Time: 06/23/15  9:33 AM  Result Value Ref Range   specimen status report Comment     Comment: Written Authorization Written Authorization Written Authorization Received. Authorization received from [facility] 06-28-2015 Logged by [place]  Problem List Items Addressed This Visit      Cardiovascular and Mediastinum   BP (high blood pressure) - Primary     Other   Clinical depression   History of tobacco use   History of ASCVD      No orders of the defined types were placed in this encounter.   1. Essential hypertension Cont. meds  2. History of ASCVD Cont meds  3. Clinical depression Cont. meds.  4. History of tobacco use Discussed smoking cessation again.

## 2015-08-26 ENCOUNTER — Other Ambulatory Visit: Payer: Self-pay | Admitting: Family Medicine

## 2015-12-07 ENCOUNTER — Encounter: Payer: Self-pay | Admitting: Family Medicine

## 2015-12-07 ENCOUNTER — Ambulatory Visit (INDEPENDENT_AMBULATORY_CARE_PROVIDER_SITE_OTHER): Payer: Self-pay | Admitting: Family Medicine

## 2015-12-07 ENCOUNTER — Other Ambulatory Visit: Payer: Self-pay | Admitting: Family Medicine

## 2015-12-07 VITALS — BP 135/70 | HR 76 | Temp 98.0°F | Resp 16 | Ht 69.0 in | Wt 178.0 lb

## 2015-12-07 DIAGNOSIS — K219 Gastro-esophageal reflux disease without esophagitis: Secondary | ICD-10-CM

## 2015-12-07 DIAGNOSIS — J302 Other seasonal allergic rhinitis: Secondary | ICD-10-CM | POA: Insufficient documentation

## 2015-12-07 DIAGNOSIS — L989 Disorder of the skin and subcutaneous tissue, unspecified: Secondary | ICD-10-CM

## 2015-12-07 DIAGNOSIS — F419 Anxiety disorder, unspecified: Secondary | ICD-10-CM

## 2015-12-07 DIAGNOSIS — I10 Essential (primary) hypertension: Secondary | ICD-10-CM

## 2015-12-07 DIAGNOSIS — Z8679 Personal history of other diseases of the circulatory system: Secondary | ICD-10-CM

## 2015-12-07 MED ORDER — LORATADINE 10 MG PO TABS: 10.0000 mg | ORAL_TABLET | Freq: Every day | ORAL | Status: DC

## 2015-12-07 NOTE — Patient Instructions (Signed)
Plan CBC, CMP, Lipid panel on return

## 2015-12-07 NOTE — Progress Notes (Signed)
Name: Clifford Morse   MRN: [id number]    DOB: [birth year]   Date:12/07/2015       Progress Note  Subjective  Chief Complaint  Chief Complaint  Patient presents with  . Hypertension  . Anxiety  . Depression    HPI Here for f/u of anxiety/depression and HBP.  He said that his anxiety is doing great.  FLuoxetine is doing very well.  No Xanax needed in past 3 months.  Taking all meds and feeling very well.  No problem-specific assessment & plan notes found for this encounter.   Past Medical History  Diagnosis Date  . Myocardial infarction ([place]) 2014  . Allergy   . Arthritis   . Asthma   . Depression   . GERD (gastroesophageal reflux disease)   . Hyperlipidemia   . Hypertension     History reviewed. No pertinent past surgical history.  Family History  Problem Relation Age of Onset  . Diabetes Mother   . Emphysema Father     Social History   Social History  . Marital Status: Married    Spouse Name: N/A  . Number of Children: N/A  . Years of Education: N/A   Occupational History  . Not on file.   Social History Main Topics  . Smoking status: Current Every Day Smoker -- 1.00 packs/day for 15 years    Types: Cigarettes  . Smokeless tobacco: Never Used  . Alcohol Use: No  . Drug Use: No  . Sexual Activity: Not on file   Other Topics Concern  . Not on file   Social History Narrative     Current outpatient prescriptions:  .  ALPRAZolam (XANAX) 0.5 MG tablet, Take 1 tablet (0.5 mg total) by mouth 2 (two) times daily as needed for anxiety. (Patient taking differently: Take 0.5 mg by mouth 2 (two) times daily as needed for anxiety (Only taking rare prn now.). ), Disp: 30 tablet, Rfl: 0 .  aspirin 81 MG tablet, Take 81 mg by mouth daily., Disp: , Rfl:  .  atenolol (TENORMIN) 100 MG tablet, Take 1 tablet (100 mg total) by mouth daily., Disp: 90 tablet, Rfl: 3 .  clopidogrel (PLAVIX) 75 MG tablet, Take 1 tablet (75 mg total) by mouth daily., Disp: 90 tablet, Rfl: 3 .   Fish Oil-Cholecalciferol (FISH OIL + D3 PO), Take by mouth., Disp: , Rfl:  .  FLUoxetine (PROZAC) 20 MG capsule, Take 1 capsule (20 mg total) by mouth daily., Disp: 30 capsule, Rfl: 6 .  lisinopril-hydrochlorothiazide (PRINZIDE,ZESTORETIC) 10-12.5 MG tablet, Take 1 tablet by mouth daily., Disp: 90 tablet, Rfl: 3 .  lovastatin (MEVACOR) 40 MG tablet, Take 1 tablet (40 mg total) by mouth at bedtime., Disp: 90 tablet, Rfl: 3 .  omeprazole (PRILOSEC) 10 MG capsule, Take 10 mg by mouth daily., Disp: , Rfl:  .  loratadine (CLARITIN) 10 MG tablet, Take 1 tablet (10 mg total) by mouth daily., Disp: 30 tablet, Rfl: 11  Not on File   Review of Systems  Constitutional: Negative for fever, chills, weight loss and malaise/fatigue.  HENT: Negative for hearing loss.   Eyes: Negative for blurred vision and double vision.  Respiratory: Positive for cough (mild chronic cough). Negative for shortness of breath and wheezing.   Cardiovascular: Negative for chest pain, palpitations and leg swelling.  Gastrointestinal: Negative for heartburn, abdominal pain and blood in stool.  Genitourinary: Negative for dysuria, urgency and frequency.  Musculoskeletal: Negative for myalgias and joint pain.  Skin: Negative for  rash.  Neurological: Positive for tremors and headaches (c/o "sinus headaches"). Negative for weakness.      Objective  Filed Vitals:   12/07/15 1609 12/07/15 1632  BP: 149/84 135/70  Pulse: 76   Temp: 98 F (36.7 C)   TempSrc: Oral   Resp: 16   Height: 5\' 9"  (1.753 m)   Weight: 178 lb (80.74 kg)     Physical Exam  Constitutional: He is oriented to person, place, and time and well-developed, well-nourished, and in no distress. No distress.  HENT:  Head: Normocephalic and atraumatic.  Nose: Rhinorrhea (clear) present. Right sinus exhibits no maxillary sinus tenderness and no frontal sinus tenderness. Left sinus exhibits no maxillary sinus tenderness and no frontal sinus tenderness.   Mouth/Throat: Oropharynx is clear and moist.  Eyes: Conjunctivae and EOM are normal. Pupils are equal, round, and reactive to light. No scleral icterus.  Neck: Normal range of motion. Neck supple. Carotid bruit is not present. No thyromegaly present.  Cardiovascular: Normal rate, regular rhythm and normal heart sounds.  Exam reveals no gallop and no friction rub.   No murmur heard. Pulmonary/Chest: Effort normal and breath sounds normal. No respiratory distress. He has no wheezes. He has no rales.  Musculoskeletal: He exhibits no edema.  Lymphadenopathy:    He has no cervical adenopathy.  Neurological: He is alert and oriented to person, place, and time.  Skin:  7 mm circular lesion above R eyebrow that is not homogenioous in color()brown and pale).  Vitals reviewed.      No results found for this or any previous visit (from the past 2160 hour(s)).   Assessment & Plan  Problem List Items Addressed This Visit      Cardiovascular and Mediastinum   BP (high blood pressure) - Primary     Respiratory   Seasonal allergies   Relevant Medications   loratadine (CLARITIN) 10 MG tablet     Digestive   Acid reflux     Musculoskeletal and Integument   Skin lesion of face   Relevant Orders   Ambulatory referral to Dermatology     Other   History of ASCVD   Acute anxiety      Meds ordered this encounter  Medications  . loratadine (CLARITIN) 10 MG tablet    Sig: Take 1 tablet (10 mg total) by mouth daily.    Dispense:  30 tablet    Refill:  11  1. Essential hypertension Cont meds  2. Acute anxiety  Cont meds 3. History of ASCVD   4. Gastroesophageal reflux disease without esophagitis Cont meds  5. Seasonal allergies  - loratadine (CLARITIN) 10 MG tablet; Take 1 tablet (10 mg total) by mouth daily.  Dispense: 30 tablet; Refill: 11  6. Skin lesion of face  - Ambulatory referral to Dermatology

## 2016-01-23 ENCOUNTER — Other Ambulatory Visit: Payer: Self-pay | Admitting: Family Medicine

## 2016-01-23 DIAGNOSIS — F329 Major depressive disorder, single episode, unspecified: Secondary | ICD-10-CM

## 2016-01-23 DIAGNOSIS — F419 Anxiety disorder, unspecified: Secondary | ICD-10-CM

## 2016-01-23 DIAGNOSIS — F32A Depression, unspecified: Secondary | ICD-10-CM

## 2016-03-21 ENCOUNTER — Encounter: Payer: Self-pay | Admitting: Family Medicine

## 2016-03-21 ENCOUNTER — Ambulatory Visit (INDEPENDENT_AMBULATORY_CARE_PROVIDER_SITE_OTHER): Payer: Self-pay | Admitting: Family Medicine

## 2016-03-21 VITALS — BP 140/80 | HR 85 | Temp 98.5°F | Resp 16 | Ht 69.0 in | Wt 188.0 lb

## 2016-03-21 DIAGNOSIS — I2510 Atherosclerotic heart disease of native coronary artery without angina pectoris: Secondary | ICD-10-CM

## 2016-03-21 DIAGNOSIS — J302 Other seasonal allergic rhinitis: Secondary | ICD-10-CM

## 2016-03-21 DIAGNOSIS — I25118 Atherosclerotic heart disease of native coronary artery with other forms of angina pectoris: Secondary | ICD-10-CM | POA: Insufficient documentation

## 2016-03-21 DIAGNOSIS — Z8679 Personal history of other diseases of the circulatory system: Secondary | ICD-10-CM

## 2016-03-21 DIAGNOSIS — K219 Gastro-esophageal reflux disease without esophagitis: Secondary | ICD-10-CM

## 2016-03-21 DIAGNOSIS — Z9889 Other specified postprocedural states: Secondary | ICD-10-CM

## 2016-03-21 DIAGNOSIS — I10 Essential (primary) hypertension: Secondary | ICD-10-CM

## 2016-03-21 MED ORDER — CARVEDILOL 12.5 MG PO TABS: 12.5000 mg | ORAL_TABLET | Freq: Two times a day (BID) | ORAL | 6 refills | Status: DC

## 2016-03-21 MED ORDER — FLUTICASONE PROPIONATE 50 MCG/ACT NA SUSP: 2.0000 | Freq: Every day | NASAL | 12 refills | Status: DC

## 2016-03-21 NOTE — Patient Instructions (Signed)
Declines flu shot today.  Will consider later

## 2016-03-21 NOTE — Progress Notes (Signed)
Name: Clifford Morse   MRN: [id number]    DOB: 1960-09-09   Date:03/21/2016       Progress Note  Subjective  Chief Complaint  Chief Complaint  Patient presents with  . Hypertension    HPI Here for f/u of HBP.  Has ASCVD.  Has 1 stent in.  No CP.  No SOB.  Feeling well without problems except allergy sx.  No problem-specific Assessment & Plan notes found for this encounter.   Past Medical History:  Diagnosis Date  . Allergy   . Arthritis   . Asthma   . Depression   . GERD (gastroesophageal reflux disease)   . Hyperlipidemia   . Hypertension   . Myocardial infarction [facility]) 2014    History reviewed. No pertinent surgical history.  Family History  Problem Relation Age of Onset  . Diabetes Mother   . Emphysema Father     Social History   Social History  . Marital status: Married    Spouse name: N/A  . Number of children: N/A  . Years of education: N/A   Occupational History  . Not on file.   Social History Main Topics  . Smoking status: Current Every Day Smoker    Packs/day: 1.00    Years: 15.00    Types: Cigarettes  . Smokeless tobacco: Never Used  . Alcohol use No  . Drug use: No  . Sexual activity: Not on file   Other Topics Concern  . Not on file   Social History Narrative  . No narrative on file     Current Outpatient Prescriptions:  .  ALPRAZolam (XANAX) 0.5 MG tablet, Take 1 tablet (0.5 mg total) by mouth 2 (two) times daily as needed for anxiety. (Patient taking differently: Take 0.5 mg by mouth 2 (two) times daily as needed for anxiety (Only taking rare prn now.). ), Disp: 30 tablet, Rfl: 0 .  aspirin 81 MG tablet, Take 81 mg by mouth daily., Disp: , Rfl:  .  clopidogrel (PLAVIX) 75 MG tablet, Take 1 tablet (75 mg total) by mouth daily., Disp: 90 tablet, Rfl: 3 .  Fish Oil-Cholecalciferol (FISH OIL + D3 PO), Take by mouth., Disp: , Rfl:  .  FLUoxetine (PROZAC) 20 MG capsule, TAKE ONE CAPSULE BY MOUTH ONCE DAILY, Disp: 30 capsule, Rfl: 2 .   lisinopril-hydrochlorothiazide (PRINZIDE,ZESTORETIC) 10-12.5 MG tablet, Take 1 tablet by mouth daily., Disp: 90 tablet, Rfl: 3 .  loratadine (CLARITIN) 10 MG tablet, Take 1 tablet (10 mg total) by mouth daily., Disp: 30 tablet, Rfl: 11 .  lovastatin (MEVACOR) 40 MG tablet, Take 1 tablet (40 mg total) by mouth at bedtime., Disp: 90 tablet, Rfl: 3 .  omeprazole (PRILOSEC) 10 MG capsule, Take 10 mg by mouth daily., Disp: , Rfl:  .  carvedilol (COREG) 12.5 MG tablet, Take 1 tablet (12.5 mg total) by mouth 2 (two) times daily with a meal., Disp: 60 tablet, Rfl: 6 .  fluticasone (FLONASE) 50 MCG/ACT nasal spray, Place 2 sprays into both nostrils daily., Disp: 16 g, Rfl: 12  Not on File   Review of Systems  Constitutional: Negative for chills, fever, malaise/fatigue and weight loss.  HENT: Positive for congestion and ear pain (with congestion). Negative for hearing loss and sore throat.   Eyes: Negative for blurred vision and double vision.  Respiratory: Negative for cough, shortness of breath and wheezing.   Cardiovascular: Negative for chest pain, palpitations and leg swelling.  Gastrointestinal: Negative for abdominal pain, blood in stool  and heartburn.  Genitourinary: Negative for dysuria, frequency and urgency.  Musculoskeletal: Negative for joint pain and myalgias.  Skin: Negative for rash.  Neurological: Positive for headaches (sinus). Negative for dizziness, tremors and weakness.      Objective  Vitals:   03/21/16 1549 03/21/16 1650  BP: (!) 142/90 140/80  Pulse: 85   Resp: 16   Temp: 98.5 F (36.9 C)   TempSrc: Oral   Weight: 188 lb (85.3 kg)   Height: 5\' 9"  (1.753 m)     Physical Exam  Constitutional: He is oriented to person, place, and time. No distress.  HENT:  Head: Normocephalic and atraumatic.  Nose: Mucosal edema and rhinorrhea present.  Mouth/Throat: Oropharynx is clear and moist.  Eyes: EOM are normal. Pupils are equal, round, and reactive to light. No scleral  icterus.  Neck: Normal range of motion. Neck supple. No thyromegaly present.  Cardiovascular: Normal rate, regular rhythm and normal heart sounds.  Exam reveals no gallop and no friction rub.   No murmur heard. Pulmonary/Chest: Effort normal and breath sounds normal. No respiratory distress. He has no wheezes. He has no rales.  Musculoskeletal: He exhibits no edema.  Lymphadenopathy:    He has no cervical adenopathy.  Neurological: He is alert and oriented to person, place, and time.  Vitals reviewed.      No results found for this or any previous visit (from the past 2160 hour(s)).   Assessment & Plan  Problem List Items Addressed This Visit      Cardiovascular and Mediastinum   BP (high blood pressure)   Relevant Medications   carvedilol (COREG) 12.5 MG tablet   ASCVD (arteriosclerotic cardiovascular disease)   Relevant Medications   carvedilol (COREG) 12.5 MG tablet     Respiratory   Seasonal allergies   Relevant Medications   fluticasone (FLONASE) 50 MCG/ACT nasal spray     Digestive   Acid reflux     Other   History of cardiac catheterization   History of ASCVD - Primary    Other Visit Diagnoses   None.     Meds ordered this encounter  Medications  . carvedilol (COREG) 12.5 MG tablet    Sig: Take 1 tablet (12.5 mg total) by mouth 2 (two) times daily with a meal.    Dispense:  60 tablet    Refill:  6  . fluticasone (FLONASE) 50 MCG/ACT nasal spray    Sig: Place 2 sprays into both nostrils daily.    Dispense:  16 g    Refill:  12   1. History of ASCVD Cont Mevacor  2. History of cardiac catheterization   3. Gastroesophageal reflux disease without esophagitis   4. Essential hypertension Cont Lisinopril/HCTZ  - carvedilol (COREG) 12.5 MG tablet; Take 1 tablet (12.5 mg total) by mouth 2 (two) times daily with a meal.  Dispense: 60 tablet; Refill: 6  5. ASCVD (arteriosclerotic cardiovascular disease) Stop Atenolol - carvedilol (COREG) 12.5 MG  tablet; Take 1 tablet (12.5 mg total) by mouth 2 (two) times daily with a meal.  Dispense: 60 tablet; Refill: 6  6. Seasonal allergies Cont Claitin - fluticasone (FLONASE) 50 MCG/ACT nasal spray; Place 2 sprays into both nostrils daily.  Dispense: 16 g; Refill: 12

## 2016-05-22 ENCOUNTER — Other Ambulatory Visit: Payer: Self-pay | Admitting: Family Medicine

## 2016-05-22 DIAGNOSIS — F329 Major depressive disorder, single episode, unspecified: Secondary | ICD-10-CM

## 2016-05-22 DIAGNOSIS — F32A Depression, unspecified: Secondary | ICD-10-CM

## 2016-05-22 DIAGNOSIS — F419 Anxiety disorder, unspecified: Secondary | ICD-10-CM

## 2016-06-30 ENCOUNTER — Other Ambulatory Visit: Payer: Self-pay | Admitting: Family Medicine

## 2016-06-30 DIAGNOSIS — Z8679 Personal history of other diseases of the circulatory system: Secondary | ICD-10-CM

## 2016-07-05 ENCOUNTER — Other Ambulatory Visit: Payer: Self-pay | Admitting: Family Medicine

## 2016-07-05 DIAGNOSIS — I10 Essential (primary) hypertension: Secondary | ICD-10-CM

## 2016-07-18 ENCOUNTER — Ambulatory Visit: Payer: Self-pay | Admitting: Family Medicine

## 2016-07-23 ENCOUNTER — Ambulatory Visit (INDEPENDENT_AMBULATORY_CARE_PROVIDER_SITE_OTHER): Payer: Self-pay | Admitting: Family Medicine

## 2016-07-23 ENCOUNTER — Encounter: Payer: Self-pay | Admitting: Family Medicine

## 2016-07-23 VITALS — BP 160/80 | HR 104 | Temp 98.7°F | Resp 16 | Ht 69.0 in | Wt 190.0 lb

## 2016-07-23 DIAGNOSIS — J302 Other seasonal allergic rhinitis: Secondary | ICD-10-CM

## 2016-07-23 DIAGNOSIS — I2510 Atherosclerotic heart disease of native coronary artery without angina pectoris: Secondary | ICD-10-CM

## 2016-07-23 DIAGNOSIS — E785 Hyperlipidemia, unspecified: Secondary | ICD-10-CM

## 2016-07-23 DIAGNOSIS — Z8679 Personal history of other diseases of the circulatory system: Secondary | ICD-10-CM

## 2016-07-23 DIAGNOSIS — I10 Essential (primary) hypertension: Secondary | ICD-10-CM

## 2016-07-23 DIAGNOSIS — F32A Depression, unspecified: Secondary | ICD-10-CM

## 2016-07-23 DIAGNOSIS — F419 Anxiety disorder, unspecified: Secondary | ICD-10-CM

## 2016-07-23 DIAGNOSIS — F329 Major depressive disorder, single episode, unspecified: Secondary | ICD-10-CM

## 2016-07-23 DIAGNOSIS — K219 Gastro-esophageal reflux disease without esophagitis: Secondary | ICD-10-CM

## 2016-07-23 MED ORDER — LOVASTATIN 40 MG PO TABS
40.0000 mg | ORAL_TABLET | Freq: Every day | ORAL | 12 refills | Status: DC
Start: 2016-07-23 — End: 2017-05-28

## 2016-07-23 MED ORDER — CARVEDILOL 25 MG PO TABS: 25.0000 mg | ORAL_TABLET | Freq: Two times a day (BID) | ORAL | 6 refills | Status: DC

## 2016-07-23 MED ORDER — FLUOXETINE HCL 40 MG PO CAPS: 40.0000 mg | ORAL_CAPSULE | Freq: Every day | ORAL | 6 refills | Status: DC

## 2016-07-23 NOTE — Progress Notes (Signed)
Name: Clifford Morse   MRN: [id number]    DOB: 1960/10/05   Date:07/23/2016       Progress Note  Subjective  Chief Complaint  Chief Complaint  Patient presents with  . Hypertension  . Hyperlipidemia    HPI Here for f/u of HBP and elevated lipids and GERD and depression/anxiety.  He feels that his anxiety is generally better, but he wishes that he felt better still with less anxiety.   He has had some persistent congestion past 1-2 weeks bu no fever or HA or facial pain. Taking his BP meds reg. No problem-specific Assessment & Plan notes found for this encounter.   Past Medical History:  Diagnosis Date  . Allergy   . Arthritis   . Asthma   . Depression   . GERD (gastroesophageal reflux disease)   . Hyperlipidemia   . Hypertension   . Myocardial infarction 2014    History reviewed. No pertinent surgical history.  Family History  Problem Relation Age of Onset  . Diabetes Mother   . Emphysema Father     Social History   Social History  . Marital status: Married    Spouse name: N/A  . Number of children: N/A  . Years of education: N/A   Occupational History  . Not on file.   Social History Main Topics  . Smoking status: Current Every Day Smoker    Packs/day: 1.00    Years: 15.00    Types: Cigarettes  . Smokeless tobacco: Never Used  . Alcohol use No  . Drug use: No  . Sexual activity: Not on file   Other Topics Concern  . Not on file   Social History Narrative  . No narrative on file     Current Outpatient Prescriptions:  .  aspirin 81 MG tablet, Take 81 mg by mouth daily., Disp: , Rfl:  .  carvedilol (COREG) 25 MG tablet, Take 1 tablet (25 mg total) by mouth 2 (two) times daily with a meal., Disp: 60 tablet, Rfl: 6 .  clopidogrel (PLAVIX) 75 MG tablet, TAKE ONE TABLET BY MOUTH ONCE DAILY, Disp: 90 tablet, Rfl: 3 .  Fish Oil-Cholecalciferol (FISH OIL + D3 PO), Take by mouth., Disp: , Rfl:  .  FLUoxetine (PROZAC) 40 MG capsule, Take 1 capsule (40 mg  total) by mouth daily., Disp: 30 capsule, Rfl: 6 .  lisinopril-hydrochlorothiazide (PRINZIDE,ZESTORETIC) 10-12.5 MG tablet, TAKE ONE TABLET BY MOUTH ONCE DAILY, Disp: 90 tablet, Rfl: 0 .  lovastatin (MEVACOR) 40 MG tablet, Take 1 tablet (40 mg total) by mouth at bedtime., Disp: 30 tablet, Rfl: 12 .  omeprazole (PRILOSEC) 10 MG capsule, Take 10 mg by mouth daily., Disp: , Rfl:  .  ALPRAZolam (XANAX) 0.5 MG tablet, Take 1 tablet (0.5 mg total) by mouth 2 (two) times daily as needed for anxiety. (Patient not taking: Reported on 07/23/2016), Disp: 30 tablet, Rfl: 0  Not on File   Review of Systems  Constitutional: Negative for chills, fever, malaise/fatigue and weight loss.  HENT: Positive for congestion. Negative for hearing loss, sinus pain, sore throat and tinnitus.   Eyes: Negative for blurred vision and double vision.  Respiratory: Negative for cough, shortness of breath and wheezing.   Cardiovascular: Negative for chest pain, palpitations and leg swelling.  Gastrointestinal: Negative for abdominal pain, blood in stool and heartburn.  Genitourinary: Negative for dysuria, frequency and urgency.  Musculoskeletal: Negative for joint pain and myalgias.  Skin: Negative for rash.  Neurological: Negative for tingling,  tremors, sensory change, weakness and headaches.  Psychiatric/Behavioral: Positive for depression (/-). The patient is nervous/anxious (mild).       Objective  Vitals:   07/23/16 1533 07/23/16 1636  BP: (!) 156/89 (!) 160/80  Pulse: (!) 103 (!) 104  Resp: 16   Temp: 98.7 F (37.1 C)   TempSrc: Oral   Weight: 190 lb (86.2 kg)   Height: 5\' 9"  (1.753 m)     Physical Exam  Constitutional: He is oriented to person, place, and time and well-developed, well-nourished, and in no distress. No distress.  HENT:  Head: Normocephalic and atraumatic.  Eyes: Conjunctivae are normal. Pupils are equal, round, and reactive to light. No scleral icterus.  Neck: Normal range of motion.  Neck supple. Carotid bruit is not present. No thyromegaly present.  Cardiovascular: Regular rhythm and normal heart sounds.  Tachycardia present.  Exam reveals no gallop and no friction rub.   No murmur heard. Pulmonary/Chest: Effort normal and breath sounds normal. No respiratory distress. He has no wheezes. He has no rales.  Abdominal: Soft. Bowel sounds are normal. He exhibits no distension, no abdominal bruit and no mass. There is no tenderness.  Musculoskeletal: He exhibits no edema.  Lymphadenopathy:    He has no cervical adenopathy.  Neurological: He is alert and oriented to person, place, and time.  Psychiatric:  Mildly anxious today  Vitals reviewed.      No results found for this or any previous visit (from the past 2160 hour(s)).   Assessment & Plan  Problem List Items Addressed This Visit      Cardiovascular and Mediastinum   BP (high blood pressure) - Primary   Relevant Medications   carvedilol (COREG) 25 MG tablet   lovastatin (MEVACOR) 40 MG tablet   ASCVD (arteriosclerotic cardiovascular disease)   Relevant Medications   carvedilol (COREG) 25 MG tablet   lovastatin (MEVACOR) 40 MG tablet     Respiratory   Seasonal allergies     Digestive   Acid reflux     Other   Clinical depression   Relevant Medications   FLUoxetine (PROZAC) 40 MG capsule   History of ASCVD   Relevant Medications   lovastatin (MEVACOR) 40 MG tablet   Acute anxiety   Relevant Medications   FLUoxetine (PROZAC) 40 MG capsule    Other Visit Diagnoses    Hyperlipidemia, unspecified hyperlipidemia type       Relevant Medications   carvedilol (COREG) 25 MG tablet   lovastatin (MEVACOR) 40 MG tablet      Meds ordered this encounter  Medications  . carvedilol (COREG) 25 MG tablet    Sig: Take 1 tablet (25 mg total) by mouth 2 (two) times daily with a meal.    Dispense:  60 tablet    Refill:  6  . FLUoxetine (PROZAC) 40 MG capsule    Sig: Take 1 capsule (40 mg total) by mouth  daily.    Dispense:  30 capsule    Refill:  6    Please consider 90 day supplies to promote better adherence  . lovastatin (MEVACOR) 40 MG tablet    Sig: Take 1 tablet (40 mg total) by mouth at bedtime.    Dispense:  30 tablet    Refill:  12   1. Essential hypertension Cont Lisinopril/HCTZ - carvedilol (COREG) 25 MG tablet; Take 1 tablet (25 mg total) by mouth 2 (two) times daily with a meal.  Dispense: 60 tablet; Refill: 6  2. ASCVD (arteriosclerotic cardiovascular  disease)  - carvedilol (COREG) 25 MG tablet; Take 1 tablet (25 mg total) by mouth 2 (two) times daily with a meal.  Dispense: 60 tablet; Refill: 6  3. Chronic seasonal allergic rhinitis, unspecified trigger Use Claritin 10 mg/d.  4. Gastroesophageal reflux disease without esophagitis Cont Omeprazole  5. Acute anxiety  - FLUoxetine (PROZAC) 40 MG capsule; Take 1 capsule (40 mg total) by mouth daily.  Dispense: 30 capsule; Refill: 6 - increased from 20 mg/d.  6. Depression, unspecified depression type  - FLUoxetine (PROZAC) 40 MG capsule; Take 1 capsule (40 mg total) by mouth daily.  Dispense: 30 capsule; Refill: 6  7. History of ASCVD  - lovastatin (MEVACOR) 40 MG tablet; Take 1 tablet (40 mg total) by mouth at bedtime.  Dispense: 30 tablet; Refill: 12  8. Hyperlipidemia, unspecified hyperlipidemia type  - lovastatin (MEVACOR) 40 MG tablet; Take 1 tablet (40 mg total) by mouth at bedtime.  Dispense: 30 tablet; Refill: 12

## 2016-08-22 ENCOUNTER — Encounter: Payer: Self-pay | Admitting: Family Medicine

## 2016-08-22 ENCOUNTER — Ambulatory Visit (INDEPENDENT_AMBULATORY_CARE_PROVIDER_SITE_OTHER): Payer: Self-pay | Admitting: Family Medicine

## 2016-08-22 VITALS — BP 135/80 | HR 83 | Temp 98.2°F | Resp 16 | Ht 69.0 in | Wt 190.0 lb

## 2016-08-22 DIAGNOSIS — I10 Essential (primary) hypertension: Secondary | ICD-10-CM

## 2016-08-22 DIAGNOSIS — I2510 Atherosclerotic heart disease of native coronary artery without angina pectoris: Secondary | ICD-10-CM

## 2016-08-22 DIAGNOSIS — K219 Gastro-esophageal reflux disease without esophagitis: Secondary | ICD-10-CM

## 2016-08-22 DIAGNOSIS — F3289 Other specified depressive episodes: Secondary | ICD-10-CM

## 2016-08-22 DIAGNOSIS — F419 Anxiety disorder, unspecified: Secondary | ICD-10-CM

## 2016-08-22 NOTE — Progress Notes (Signed)
Name: Clifford Morse   MRN: [id number]    DOB: [birth year]   Date:08/22/2016       Progress Note  Subjective  Chief Complaint  Chief Complaint  Patient presents with  . Hypertension  . Heart Problem  . Hyperlipidemia  . Gastroesophageal Reflux    HPI Here for f/u of HBP and ASCVD.  No chest pain.  GERD is doing ok.  Takinig all meds.  No real c/o  No problem-specific Assessment & Plan notes found for this encounter.   Past Medical History:  Diagnosis Date  . Allergy   . Arthritis   . Asthma   . Depression   . GERD (gastroesophageal reflux disease)   . Hyperlipidemia   . Hypertension   . Myocardial infarction 2014    History reviewed. No pertinent surgical history.  Family History  Problem Relation Age of Onset  . Diabetes Mother   . Emphysema Father     Social History   Social History  . Marital status: Married    Spouse name: N/A  . Number of children: N/A  . Years of education: N/A   Occupational History  . Not on file.   Social History Main Topics  . Smoking status: Current Every Day Smoker    Packs/day: 1.00    Years: 15.00    Types: Cigarettes  . Smokeless tobacco: Never Used  . Alcohol use No  . Drug use: No  . Sexual activity: Not on file   Other Topics Concern  . Not on file   Social History Narrative  . No narrative on file     Current Outpatient Prescriptions:  .  aspirin 81 MG tablet, Take 81 mg by mouth daily., Disp: , Rfl:  .  carvedilol (COREG) 25 MG tablet, Take 1 tablet (25 mg total) by mouth 2 (two) times daily with a meal., Disp: 60 tablet, Rfl: 6 .  clopidogrel (PLAVIX) 75 MG tablet, TAKE ONE TABLET BY MOUTH ONCE DAILY, Disp: 90 tablet, Rfl: 3 .  Fish Oil-Cholecalciferol (FISH OIL + D3 PO), Take by mouth., Disp: , Rfl:  .  FLUoxetine (PROZAC) 40 MG capsule, Take 1 capsule (40 mg total) by mouth daily., Disp: 30 capsule, Rfl: 6 .  lisinopril-hydrochlorothiazide (PRINZIDE,ZESTORETIC) 10-12.5 MG tablet, TAKE ONE TABLET BY MOUTH  ONCE DAILY, Disp: 90 tablet, Rfl: 0 .  lovastatin (MEVACOR) 40 MG tablet, Take 1 tablet (40 mg total) by mouth at bedtime., Disp: 30 tablet, Rfl: 12 .  omeprazole (PRILOSEC) 20 MG capsule, Take 20 mg by mouth daily. , Disp: , Rfl:  .  ALPRAZolam (XANAX) 0.5 MG tablet, Take 1 tablet (0.5 mg total) by mouth 2 (two) times daily as needed for anxiety. (Patient not taking: Reported on 08/22/2016), Disp: 30 tablet, Rfl: 0  Not on File   Review of Systems  Constitutional: Negative for chills, fever, malaise/fatigue and weight loss.  HENT: Negative for hearing loss and tinnitus.   Eyes: Negative for blurred vision and double vision.  Respiratory: Negative for cough, shortness of breath and wheezing.   Cardiovascular: Negative for chest pain, palpitations and leg swelling.  Gastrointestinal: Negative for abdominal pain, blood in stool and heartburn.  Genitourinary: Negative for dysuria, frequency and urgency.  Musculoskeletal: Negative for joint pain and myalgias.  Skin: Negative for rash.  Neurological: Negative for dizziness, tingling, tremors, weakness and headaches.  Psychiatric/Behavioral: Negative for depression. The patient is not nervous/anxious.       Objective  Vitals:   08/22/16 [id number] 08/22/16  1009  BP: (!) 144/90 135/80  Pulse: 83   Resp: 16   Temp: 98.2 F (36.8 C)   TempSrc: Oral   Weight: 190 lb (86.2 kg)   Height: 5\' 9"  (1.753 m)     Physical Exam  Constitutional: He is oriented to person, place, and time and well-developed, well-nourished, and in no distress. No distress.  HENT:  Head: Normocephalic and atraumatic.  Eyes: Conjunctivae and EOM are normal. Pupils are equal, round, and reactive to light. No scleral icterus.  Neck: Normal range of motion. Neck supple. Carotid bruit is not present. No thyromegaly present.  Cardiovascular: Normal rate, regular rhythm and normal heart sounds.  Exam reveals no gallop and no friction rub.   No murmur heard. Pulmonary/Chest:  Effort normal and breath sounds normal. No respiratory distress. He has no wheezes. He has no rales.  Abdominal: Soft. He exhibits no distension and no mass. There is no tenderness.  Musculoskeletal: He exhibits no edema.  Lymphadenopathy:    He has no cervical adenopathy.  Neurological: He is alert and oriented to person, place, and time.  Vitals reviewed.      No results found for this or any previous visit (from the past 2160 hour(s)).   Assessment & Plan  Problem List Items Addressed This Visit      Cardiovascular and Mediastinum   BP (high blood pressure) - Primary   Relevant Orders   COMPLETE METABOLIC PANEL WITH GFR   ASCVD (arteriosclerotic cardiovascular disease)   Relevant Orders   Lipid Profile     Digestive   Acid reflux   Relevant Orders   CBC with Differential     Other   Clinical depression   Acute anxiety      No orders of the defined types were placed in this encounter.  1. Essential hypertension Cont meds - COMPLETE METABOLIC PANEL WITH GFR  2. ASCVD (arteriosclerotic cardiovascular disease) Cont meds - Lipid Profile  3. Gastroesophageal reflux disease without esophagitis cont meds - CBC with Differential  4. Other depression Cont med  5. Acute anxiety

## 2016-08-26 ENCOUNTER — Other Ambulatory Visit: Payer: Self-pay | Admitting: Family Medicine

## 2016-08-26 ENCOUNTER — Other Ambulatory Visit: Payer: Self-pay

## 2016-08-26 LAB — CBC WITH DIFFERENTIAL/PLATELET
BASOS ABS: 83 {cells}/uL (ref 0–200)
BASOS PCT: 1 %
EOS ABS: 332 {cells}/uL (ref 15–500)
EOS PCT: 4 %
HCT: 42.8 % (ref 38.5–50.0)
HEMOGLOBIN: 15 g/dL (ref 13.2–17.1)
LYMPHS ABS: 2656 {cells}/uL (ref 850–3900)
Lymphocytes Relative: 32 %
MCH: 31.6 pg (ref 27.0–33.0)
MCHC: 35 g/dL (ref 32.0–36.0)
MCV: 90.1 fL (ref 80.0–100.0)
MPV: 8.9 fL (ref 7.5–12.5)
Monocytes Absolute: 581 cells/uL (ref 200–950)
Monocytes Relative: 7 %
NEUTROS ABS: 4648 {cells}/uL (ref 1500–7800)
Neutrophils Relative %: 56 %
PLATELETS: 256 10*3/uL (ref 140–400)
RBC: 4.75 MIL/uL (ref 4.20–5.80)
RDW: 13.1 % (ref 11.0–15.0)
WBC: 8.3 10*3/uL (ref 3.8–10.8)

## 2016-08-27 ENCOUNTER — Encounter: Payer: Self-pay | Admitting: *Deleted

## 2016-08-27 LAB — COMPLETE METABOLIC PANEL WITH GFR
ALT: 53 U/L — ABNORMAL HIGH (ref 9–46)
AST: 40 U/L — ABNORMAL HIGH (ref 10–35)
Albumin: 4.3 g/dL (ref 3.6–5.1)
Alkaline Phosphatase: 64 U/L (ref 40–115)
BUN: 11 mg/dL (ref 7–25)
CO2: 28 mmol/L (ref 20–31)
Calcium: 9.3 mg/dL (ref 8.6–10.3)
Chloride: 100 mmol/L (ref 98–110)
Creat: 0.78 mg/dL (ref 0.70–1.33)
GLUCOSE: 115 mg/dL — AB (ref 65–99)
POTASSIUM: 4.4 mmol/L (ref 3.5–5.3)
SODIUM: 136 mmol/L (ref 135–146)
Total Bilirubin: 0.5 mg/dL (ref 0.2–1.2)
Total Protein: 6.9 g/dL (ref 6.1–8.1)

## 2016-08-27 LAB — LIPID PANEL
CHOL/HDL RATIO: 4.3 ratio (ref <5.0)
CHOLESTEROL: 167 mg/dL (ref <200)
HDL: 39 mg/dL — AB (ref >40)
LDL Cholesterol: 85 mg/dL (ref <100)
Triglycerides: 216 mg/dL — ABNORMAL HIGH (ref <150)
VLDL: 43 mg/dL — AB (ref <30)

## 2016-08-28 LAB — HEMOGLOBIN A1C
HEMOGLOBIN A1C: 6.5 % — AB (ref <5.7)
MEAN PLASMA GLUCOSE: 140 mg/dL

## 2016-09-21 ENCOUNTER — Other Ambulatory Visit: Payer: Self-pay | Admitting: Family Medicine

## 2016-09-21 DIAGNOSIS — I10 Essential (primary) hypertension: Secondary | ICD-10-CM

## 2016-12-20 ENCOUNTER — Ambulatory Visit: Payer: Self-pay | Admitting: Family Medicine

## 2017-05-28 ENCOUNTER — Other Ambulatory Visit: Payer: Self-pay | Admitting: Family Medicine

## 2017-05-28 ENCOUNTER — Encounter: Payer: Self-pay | Admitting: Family Medicine

## 2017-05-28 ENCOUNTER — Ambulatory Visit: Payer: Self-pay | Admitting: Family Medicine

## 2017-05-28 VITALS — BP 152/88 | HR 101 | Temp 98.3°F | Resp 16 | Ht 69.0 in | Wt 190.0 lb

## 2017-05-28 DIAGNOSIS — Z720 Tobacco use: Secondary | ICD-10-CM

## 2017-05-28 DIAGNOSIS — R7309 Other abnormal glucose: Secondary | ICD-10-CM

## 2017-05-28 DIAGNOSIS — F32A Depression, unspecified: Secondary | ICD-10-CM

## 2017-05-28 DIAGNOSIS — Z0000 Encounter for general adult medical examination without abnormal findings: Secondary | ICD-10-CM

## 2017-05-28 DIAGNOSIS — I10 Essential (primary) hypertension: Secondary | ICD-10-CM

## 2017-05-28 DIAGNOSIS — F329 Major depressive disorder, single episode, unspecified: Secondary | ICD-10-CM

## 2017-05-28 DIAGNOSIS — K760 Fatty (change of) liver, not elsewhere classified: Secondary | ICD-10-CM

## 2017-05-28 DIAGNOSIS — F419 Anxiety disorder, unspecified: Secondary | ICD-10-CM

## 2017-05-28 DIAGNOSIS — E782 Mixed hyperlipidemia: Secondary | ICD-10-CM

## 2017-05-28 DIAGNOSIS — K219 Gastro-esophageal reflux disease without esophagitis: Secondary | ICD-10-CM

## 2017-05-28 DIAGNOSIS — E785 Hyperlipidemia, unspecified: Secondary | ICD-10-CM

## 2017-05-28 DIAGNOSIS — I2510 Atherosclerotic heart disease of native coronary artery without angina pectoris: Secondary | ICD-10-CM

## 2017-05-28 DIAGNOSIS — Z125 Encounter for screening for malignant neoplasm of prostate: Secondary | ICD-10-CM

## 2017-05-28 DIAGNOSIS — E1169 Type 2 diabetes mellitus with other specified complication: Secondary | ICD-10-CM | POA: Insufficient documentation

## 2017-05-28 MED ORDER — LISINOPRIL-HYDROCHLOROTHIAZIDE 10-12.5 MG PO TABS: 1.0000 | ORAL_TABLET | Freq: Every day | ORAL | 3 refills | Status: DC

## 2017-05-28 MED ORDER — CLOPIDOGREL BISULFATE 75 MG PO TABS: 75.0000 mg | ORAL_TABLET | Freq: Every day | ORAL | 11 refills | Status: DC

## 2017-05-28 MED ORDER — CARVEDILOL 25 MG PO TABS: 25.0000 mg | ORAL_TABLET | Freq: Two times a day (BID) | ORAL | 11 refills | Status: DC

## 2017-05-28 MED ORDER — FLUOXETINE HCL 40 MG PO CAPS: 40.0000 mg | ORAL_CAPSULE | Freq: Every day | ORAL | 11 refills | Status: DC

## 2017-05-28 MED ORDER — LOVASTATIN 40 MG PO TABS: 40.0000 mg | ORAL_TABLET | Freq: Every day | ORAL | 11 refills | Status: DC

## 2017-05-28 NOTE — Assessment & Plan Note (Signed)
Stable controlled on OTC PPI

## 2017-05-28 NOTE — Assessment & Plan Note (Signed)
Active smoker Not ready to quit Failed Wellbutrin Consider [place] Quitline # given, may try NRT Follow-up as needed

## 2017-05-28 NOTE — Assessment & Plan Note (Signed)
Stable currently, chronic problem, mixed anxiety/depression Failed Wellbutrin, Cymbalta Off BDZ Xanax Refilled Fluoxetine 40mg  daily

## 2017-05-28 NOTE — Progress Notes (Signed)
Subjective:    Patient ID: Clifford Morse, male    DOB: 1961/05/31, 56 y.o.   MRN: [id number]  CHASTON BRADBURN is a 56 y.o. male presenting on 05/28/2017 for Hypertension (follow up last seen Dr. Luan Pulling 02/18 needs refill)  Re-establishing with new PCP here at [facility].  HPI   CHRONIC HTN: Reports long history of HTN for many years, has been on Lisinopril for long time, and previously was on Atenolol then this was switched to Carvedilol in past, and has done well. States he does not check BP outside office. Current Meds - Carvedilol 25mg  BID (last dose yesterday, just ran out), Lisinopril-HCTZ 10-12.5mg  daily (unsure if refills) Reports good compliance, did not take carvedilol today,but took other.Tolerating well, w/o complaints. Denies CP, dyspnea, HA, edema, dizziness / lightheadedness  HYPERLIPIDEMIA / CAD s/p stent - Past history of known CAD s/p MI with stent in 03/02/13 DES in [place], previously followed by [facility] Dr Saralyn Pilar, last seen 05/2015 - Today reports no new concerns. Last lipid panel 08/2016, controlled  - Previously taking Lovastatin 40mg  nightly he was tolerating well without side effects or myalgias, but he was forgetting to take it, so stopped - Taking ASA 81 and Plavix - Still taking Fish Oil occasional  Chronic Anxiety / Depression: - Reports long history of mixed anxiety and depressive symptoms, seems to have multiple factors contributing. He has been on variety of meds before, never seen by Psychiatry. Failed Wellbutrin and Cymbalta in past. He has done much better on Fluoxetine, currently taking 40mg  daily needs refill, also admits that his "muscles feel better" on this medicine, has some aches otherwise - Off Xanax was PRN for doctors visits and other stress  GERD - taking OTC Omeparzole 20mg   Health Maintenance: - Due for Flu Shot, declines today despite counseling on benefits  Prostate CA Screening: No prior prostate CA screening on chart, do not see prior  PSA, he admits having prior DRE long time ago was "normal", previously saw Urology unsure where for low testosterone, no longer going. Currently asymptomatic without BPH LUTS. No known family history of prostate CA. Due for screening.  Colon CA Screening: Never had colonoscopy or other screening. Currently asymptomatic. No known family history of colon CA. Due for screening test - he is self pay at the moment waiting on insurance in future, he may reconsider, will consider Cologuard, handout given   Depression screen [facility] 2/9 05/28/2017 08/22/2016 03/21/2016  Decreased Interest 1 0 0  Down, Depressed, Hopeless 0 0 0  PHQ - 2 Score 1 0 0  Altered sleeping 1 - -  Tired, decreased energy 1 - -  Change in appetite 0 - -  Feeling bad or failure about yourself  0 - -  Trouble concentrating 0 - -  Moving slowly or fidgety/restless 0 - -  Suicidal thoughts 0 - -  PHQ-9 Score 3 - -  Difficult doing work/chores Somewhat difficult - -   GAD 7 : Generalized Anxiety Score 05/28/2017 03/21/2016  Nervous, Anxious, on Edge 0 0  Control/stop worrying 0 0  Worry too much - different things 0 0  Trouble relaxing 0 0  Restless 0 1  Easily annoyed or irritable 0 0  Afraid - awful might happen 0 0  Total GAD 7 Score 0 1  Anxiety Difficulty Not difficult at all Not difficult at all   Social History   Tobacco Use  . Smoking status: Current Every Day Smoker    Packs/day:  1.00    Years: 15.00    Pack years: 15.00    Types: Cigarettes  . Smokeless tobacco: Current User  Substance Use Topics  . Alcohol use: No    Alcohol/week: 0.0 oz  . Drug use: No    Review of Systems Per HPI unless specifically indicated above     Objective:    BP (!) 152/88 (BP Location: Left Arm, Cuff Size: Normal)   Pulse (!) 101   Temp 98.3 F (36.8 C) (Oral)   Resp 16   Ht 5\' 9"  (1.753 m)   Wt 190 lb (86.2 kg)   BMI 28.06 kg/m   Wt Readings from Last 3 Encounters:  05/28/17 190 lb (86.2 kg)  08/22/16 190 lb  (86.2 kg)  07/23/16 190 lb (86.2 kg)    Physical Exam  Constitutional: He is oriented to person, place, and time. He appears well-developed and well-nourished. No distress.  Well-appearing, comfortable, cooperative  HENT:  Head: Normocephalic and atraumatic.  Mouth/Throat: Oropharynx is clear and moist.  Eyes: Conjunctivae are normal. Right eye exhibits no discharge. Left eye exhibits no discharge.  Neck: Normal range of motion.  Cardiovascular: Normal rate, regular rhythm, normal heart sounds and intact distal pulses.  No murmur heard. Pulmonary/Chest: Effort normal and breath sounds normal. No respiratory distress. He has no wheezes. He has no rales.  Musculoskeletal: Normal range of motion. He exhibits no edema.  Neurological: He is alert and oriented to person, place, and time.  Skin: Skin is warm and dry. No rash noted. He is not diaphoretic. No erythema.  Psychiatric: He has a normal mood and affect. His behavior is normal.  Well groomed, good eye contact, normal speech and thoughts  Nursing note and vitals reviewed.  Results for orders placed or performed in visit on 08/26/16  Hemoglobin A1c  Result Value Ref Range   Hgb A1c MFr Bld 6.5 (H) <5.7 %   Mean Plasma Glucose 140 mg/dL      Assessment & Plan:   Problem List Items Addressed This Visit    Anxiety and depression    Stable currently, chronic problem, mixed anxiety/depression Failed Wellbutrin, Cymbalta Off BDZ Xanax Refilled Fluoxetine 40mg  daily      Relevant Medications   FLUoxetine (PROZAC) 40 MG capsule   CAD (coronary artery disease)    Stable without angina Chronic problem with 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 No longer followed by [facility] Dr Saralyn Pilar, last 2016 On Plavix ASA, BB, ACEi - was on statin but self discontinued forgot to take - Restart Statin today, refilled all meds - Recommend future follow-up with Cardiology       Relevant Medications   carvedilol (COREG) 25 MG tablet    lisinopril-hydrochlorothiazide (PRINZIDE,ZESTORETIC) 10-12.5 MG tablet   lovastatin (MEVACOR) 40 MG tablet   clopidogrel (PLAVIX) 75 MG tablet   Essential hypertension - Primary    Currently elevated uncontrolled BP off med - Home BP readings limited  Complication with known CAD   Plan:  1. Continue current BP regimen - Carvedilol 25mg  BID, Lisinoipril-HCTZ 10-12.5mg  daily - refilled both 2. Encourage improved lifestyle - low sodium diet, regular exercise improve 3. Start monitor BP outside office, bring readings to next visit, if persistently >140/90 or new symptoms notify office sooner - Smoking cessation 4. Follow-up 4 months annual + labs      Relevant Medications   carvedilol (COREG) 25 MG tablet   lisinopril-hydrochlorothiazide (PRINZIDE,ZESTORETIC) 10-12.5 MG tablet   lovastatin (MEVACOR) 40 MG tablet  GERD (gastroesophageal reflux disease)    Stable controlled on OTC PPI      Hyperlipidemia    Previously controlled cholesterol on statin and lifestyle Last lipid panel 08/2016 ASCVD with known CAD  Plan: 1. Restart statin - Lovastatin 40mg  daily - new rx sent 2. Continue DAPT Plavix + ASA 81mg  for secondary ASCVD risk reduction 3. Encourage improved lifestyle - low carb/cholesterol, reduce portion size, continue improving regular exercise 4. Follow-up 4 months labs annual      Relevant Medications   carvedilol (COREG) 25 MG tablet   lisinopril-hydrochlorothiazide (PRINZIDE,ZESTORETIC) 10-12.5 MG tablet   lovastatin (MEVACOR) 40 MG tablet   Tobacco abuse    Active smoker Not ready to quit Failed Wellbutrin Consider [place] Quitline # given, may try NRT Follow-up as needed         Meds ordered this encounter  Medications  . carvedilol (COREG) 25 MG tablet    Sig: Take 1 tablet (25 mg total) by mouth 2 (two) times daily with a meal.    Dispense:  60 tablet    Refill:  11  . lisinopril-hydrochlorothiazide (PRINZIDE,ZESTORETIC) 10-12.5 MG tablet    Sig: Take 1  tablet by mouth daily.    Dispense:  90 tablet    Refill:  3  . lovastatin (MEVACOR) 40 MG tablet    Sig: Take 1 tablet (40 mg total) by mouth at bedtime.    Dispense:  30 tablet    Refill:  11  . clopidogrel (PLAVIX) 75 MG tablet    Sig: Take 1 tablet (75 mg total) by mouth daily.    Dispense:  30 tablet    Refill:  11  . FLUoxetine (PROZAC) 40 MG capsule    Sig: Take 1 capsule (40 mg total) by mouth daily.    Dispense:  30 capsule    Refill:  11    Follow up plan: Return in about 4 months (around 09/25/2017) for Annual Physical.  Future orders placed for 08/2017.  Nobie Putnam, DO [facility] 05/28/2017, 1:45 PM

## 2017-05-28 NOTE — Patient Instructions (Addendum)
Thank you for coming to the clinic today.  1. Refilled all medicines  2. BP is slightly improved, resume meds, keep track on BP  3. Let me know if interested in help quitting smoking  QUITLINE 1 800-QUIT NOW  Colon Cancer Screening: - For all adults age 57+ routine colon cancer screening is highly recommended.     - Recent guidelines from [place] recommend starting age of 45 - Early detection of colon cancer is important, because often there are no warning signs or symptoms, also if found early usually it can be cured. Late stage is hard to treat.  - If you are not interested in Colonoscopy screening (if done and normal you could be cleared for 5 to 10 years until next due), then Cologuard is an excellent alternative for screening test for Colon Cancer. It is highly sensitive for detecting DNA of colon cancer from even the earliest stages. Also, there is NO bowel prep required. - If Cologuard is NEGATIVE, then it is good for 3 years before next due - If Cologuard is POSITIVE, then it is strongly advised to get a Colonoscopy, which allows the GI doctor to locate the source of the cancer or polyp (even very early stage) and treat it by removing it. ------------------------- If you would like to proceed with Cologuard (stool DNA test) - FIRST, call your insurance company and tell them you want to check cost of Cologuard tell them CPT Code [phone] (it may be completely covered and you could get for no cost, OR max cost without any coverage is about $600). Also, keep in mind if you do NOT open the kit, and decide not to do the test, you will NOT be charged, you should contact the company if you decide not to do the test. - If you want to proceed, you can notify us (phone message, [place], or at next visit) and we will order it for you. The test kit will be delivered to you house within about 1 week. Follow instructions to collect sample, you may call the company for any help or  questions, 24/7 telephone support at [phone].  DUE for FASTING BLOOD WORK (no food or drink after midnight before the lab appointment, only water or coffee without cream/sugar on the morning of)  SCHEDULE "Lab Only" visit in the morning at the clinic for lab draw in 4 MONTHS   - Make sure Lab Only appointment is at about 1 week before your next appointment, so that results will be available  For Lab Results, once available within 2-3 days of blood draw, you can can log in to MyChart online to view your results and a brief explanation. Also, we can discuss results at next follow-up visit.  Please schedule a Follow-up Appointment to: Return in about 4 months (around 09/25/2017) for Annual Physical.  If you have any other questions or concerns, please feel free to call the clinic or send a message through [place]. You may also schedule an earlier appointment if necessary.  Additionally, you may be receiving a survey about your experience at our clinic within a few days to 1 week by e-mail or mail. We value your feedback.  Nobie Putnam, DO [place]

## 2017-05-28 NOTE — Assessment & Plan Note (Addendum)
Currently elevated uncontrolled BP off med - Home BP readings limited  Complication with known CAD   Plan:  1. Continue current BP regimen - Carvedilol 25mg  BID, Lisinoipril-HCTZ 10-12.5mg  daily - refilled both 2. Encourage improved lifestyle - low sodium diet, regular exercise improve 3. Start monitor BP outside office, bring readings to next visit, if persistently >140/90 or new symptoms notify office sooner - Smoking cessation 4. Follow-up 4 months annual + labs

## 2017-05-28 NOTE — Assessment & Plan Note (Signed)
Previously controlled cholesterol on statin and lifestyle Last lipid panel 08/2016 ASCVD with known CAD  Plan: 1. Restart statin - Lovastatin 40mg  daily - new rx sent 2. Continue DAPT Plavix + ASA 81mg  for secondary ASCVD risk reduction 3. Encourage improved lifestyle - low carb/cholesterol, reduce portion size, continue improving regular exercise 4. Follow-up 4 months labs annual

## 2017-05-28 NOTE — Assessment & Plan Note (Signed)
Stable without angina Chronic problem with 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 No longer followed by [facility] Cardiology Dr Saralyn Pilar, last 2016 On Plavix ASA, BB, ACEi - was on statin but self discontinued forgot to take - Restart Statin today, refilled all meds - Recommend future follow-up with Cardiology

## 2017-09-26 ENCOUNTER — Other Ambulatory Visit: Payer: Self-pay

## 2017-10-01 ENCOUNTER — Ambulatory Visit (INDEPENDENT_AMBULATORY_CARE_PROVIDER_SITE_OTHER): Payer: Self-pay | Admitting: Family Medicine

## 2017-10-01 ENCOUNTER — Encounter: Payer: Self-pay | Admitting: Family Medicine

## 2017-10-01 VITALS — BP 130/84 | HR 76 | Temp 98.1°F | Resp 16 | Ht 69.0 in | Wt 194.0 lb

## 2017-10-01 DIAGNOSIS — I10 Essential (primary) hypertension: Secondary | ICD-10-CM

## 2017-10-01 DIAGNOSIS — Z0001 Encounter for general adult medical examination with abnormal findings: Secondary | ICD-10-CM

## 2017-10-01 DIAGNOSIS — F17210 Nicotine dependence, cigarettes, uncomplicated: Secondary | ICD-10-CM

## 2017-10-01 DIAGNOSIS — E1169 Type 2 diabetes mellitus with other specified complication: Secondary | ICD-10-CM

## 2017-10-01 DIAGNOSIS — F419 Anxiety disorder, unspecified: Secondary | ICD-10-CM

## 2017-10-01 DIAGNOSIS — I2510 Atherosclerotic heart disease of native coronary artery without angina pectoris: Secondary | ICD-10-CM

## 2017-10-01 DIAGNOSIS — F32A Depression, unspecified: Secondary | ICD-10-CM

## 2017-10-01 DIAGNOSIS — Z0000 Encounter for general adult medical examination without abnormal findings: Secondary | ICD-10-CM

## 2017-10-01 DIAGNOSIS — E782 Mixed hyperlipidemia: Secondary | ICD-10-CM

## 2017-10-01 DIAGNOSIS — K760 Fatty (change of) liver, not elsewhere classified: Secondary | ICD-10-CM

## 2017-10-01 DIAGNOSIS — F329 Major depressive disorder, single episode, unspecified: Secondary | ICD-10-CM

## 2017-10-01 LAB — CBC WITH DIFFERENTIAL/PLATELET
Basophils Absolute: 94 cells/uL (ref 0–200)
Basophils Relative: 1.2 %
EOS PCT: 3.5 %
Eosinophils Absolute: 273 cells/uL (ref 15–500)
HEMATOCRIT: 41 % (ref 38.5–50.0)
Hemoglobin: 14.7 g/dL (ref 13.2–17.1)
LYMPHS ABS: 2441 {cells}/uL (ref 850–3900)
MCH: 31.3 pg (ref 27.0–33.0)
MCHC: 35.9 g/dL (ref 32.0–36.0)
MCV: 87.4 fL (ref 80.0–100.0)
MPV: 9.3 fL (ref 7.5–12.5)
Monocytes Relative: 6.6 %
NEUTROS PCT: 57.4 %
Neutro Abs: 4477 cells/uL (ref 1500–7800)
Platelets: 263 10*3/uL (ref 140–400)
RBC: 4.69 10*6/uL (ref 4.20–5.80)
RDW: 12.1 % (ref 11.0–15.0)
Total Lymphocyte: 31.3 %
WBC mixed population: 515 cells/uL (ref 200–950)
WBC: 7.8 10*3/uL (ref 3.8–10.8)

## 2017-10-01 LAB — COMPLETE METABOLIC PANEL WITH GFR
AG Ratio: 1.7 (calc) (ref 1.0–2.5)
ALT: 43 U/L (ref 9–46)
AST: 37 U/L — AB (ref 10–35)
Albumin: 4.3 g/dL (ref 3.6–5.1)
Alkaline phosphatase (APISO): 80 U/L (ref 40–115)
BUN: 12 mg/dL (ref 7–25)
CALCIUM: 9 mg/dL (ref 8.6–10.3)
CO2: 29 mmol/L (ref 20–32)
CREATININE: 0.78 mg/dL (ref 0.70–1.33)
Chloride: 101 mmol/L (ref 98–110)
GFR, EST NON AFRICAN AMERICAN: 100 mL/min/{1.73_m2} (ref >=60)
GFR, Est African American: 116 mL/min/{1.73_m2} (ref >=60)
GLOBULIN: 2.6 g/dL (ref 1.9–3.7)
Glucose, Bld: 127 mg/dL — ABNORMAL HIGH (ref 65–99)
Potassium: 4.5 mmol/L (ref 3.5–5.3)
SODIUM: 139 mmol/L (ref 135–146)
Total Bilirubin: 0.5 mg/dL (ref 0.2–1.2)
Total Protein: 6.9 g/dL (ref 6.1–8.1)

## 2017-10-01 LAB — PSA, TOTAL WITH REFLEX TO PSA, FREE: PSA, Total: 0.6 ng/mL (ref <=4.0)

## 2017-10-01 LAB — HEMOGLOBIN A1C
Hgb A1c MFr Bld: 6.9 % of total Hgb — ABNORMAL HIGH (ref <5.7)
Mean Plasma Glucose: 151 (calc)
eAG (mmol/L): 8.4 (calc)

## 2017-10-01 LAB — LIPID PANEL
Cholesterol: 216 mg/dL — ABNORMAL HIGH (ref <200)
HDL: 37 mg/dL — ABNORMAL LOW (ref >40)
LDL Cholesterol (Calc): 139 mg/dL (calc) — ABNORMAL HIGH
NON-HDL CHOLESTEROL (CALC): 179 mg/dL — AB (ref <130)
TRIGLYCERIDES: 247 mg/dL — AB (ref <150)
Total CHOL/HDL Ratio: 5.8 (calc) — ABNORMAL HIGH (ref <5.0)

## 2017-10-01 MED ORDER — METFORMIN HCL 500 MG PO TABS: 500.0000 mg | ORAL_TABLET | Freq: Two times a day (BID) | ORAL | 2 refills | Status: DC

## 2017-10-01 NOTE — Progress Notes (Addendum)
Subjective:    Patient ID: Clifford Morse, male    DOB: April 08, 1961, 57 y.o.   MRN: [id number]  Clifford Morse is a 57 y.o. male presenting on 10/01/2017 for Annual Exam and Diabetes (new diagnosis)   HPI   Here for Annual Exam and Lab Review. Additionally new diagnosis of Type 2 Diabetes today.   CHRONIC HTN: Reports long history of HTN. States he does not check BP outside office. Current Meds - Carvedilol 25mg  BID (last dose yesterday, just ran out), Lisinopril-HCTZ 10-12.5mg  daily (unsure if refills) Reports good compliance, occasional miss dose.Tolerating well, w/o complaints.  HYPERLIPIDEMIA / CAD s/p stent - Past history of known CAD s/p MI with stent in 03/02/13 DES in L[place], previously followed by [facility] Cardiology Dr Saralyn Pilar, last seen 05/2015,  Has not returned. - Today reports no new concerns. Last lipid panel 09/2017, uncontrolled, elevated LDL and TG - Taking Lovastatin 40mg  nightly, has still missed some doses - Taking ASA 81 and Plavix - Still taking Fish Oil occasional  PMH - Chronic Anxiety / Depression, GERD  Fatty Liver / Elevated LFT Improved result from previous ALT/AST, now improved AST 37. Had prior EGD and Liver US has shown Fatty Liver disease  DM, Type 2 (New Diagnosis, prior elevated A1c) / Possible neuropathy complication Reports concern with fam history of diabetes multiple relatives. He was told elevated sugar before but not diagnosed. CBGs: Not checking Meds: Never on meds Currently on ACEi already Lifestyle: - Diet (not following DM diet, drinks soda daily)  - Exercise (no regular exercise, active with work) Admits occasional foot pain and stinging Denies hypoglycemia, polyuria, visual changes, numbness or tingling.  Tobacco abuse Active smoker, 1ppd >15 years. Recently reduced amount smoking now that grandchild moved in to live with him. He smokes outside. Not interested in quitting at this time.  Health Maintenance:  Prostate CA Screening: No  prior PSA. He admits having prior DRE long time ago was "normal", previously saw Urology unsure where for low testosterone, no longer going. - Last PSA 0.6 (09/30/17). Currently asymptomatic without BPH LUTS. No known family history of prostate CA.  Colon CA Screening: Never had colonoscopy or other screening. Currently asymptomatic. No known family history of colon CA. Due for screening test - he has not checked on cost and coverage of Cologuard, will reconsider this now  Health Maintenance: As new diabetic, reviewed some HM topics for future - will offer Pneumonia vaccine pneumovax-23 before age 53 as diabetic, also due for DM Eye Exam, handout given to schedule, and will perform DM Foot exam,   Depression screen [facility] 2/9 10/01/2017 05/28/2017 08/22/2016  Decreased Interest 0 1 0  Down, Depressed, Hopeless 1 0 0  PHQ - 2 Score 1 1 0  Altered sleeping 2 1 -  Tired, decreased energy 2 1 -  Change in appetite 1 0 -  Feeling bad or failure about yourself  0 0 -  Trouble concentrating 0 0 -  Moving slowly or fidgety/restless 0 0 -  Suicidal thoughts 0 0 -  PHQ-9 Score 6 3 -  Difficult doing work/chores Not difficult at all Somewhat difficult -    Past Medical History:  Diagnosis Date  . Allergy   . Arthritis   . Asthma   . Depression   . GERD (gastroesophageal reflux disease)   . Hyperlipidemia   . Myocardial infarction [facility]) 2014   History reviewed. No pertinent surgical history. Social History   Socioeconomic History  . Marital  status: Married    Spouse name: Not on file  . Number of children: Not on file  . Years of education: Not on file  . Highest education level: Not on file  Occupational History  . Not on file  Social Needs  . Financial resource strain: Not on file  . Food insecurity:    Worry: Not on file    Inability: Not on file  . Transportation needs:    Medical: Not on file    Non-medical: Not on file  Tobacco Use  . Smoking status: Current Every Day Smoker      Packs/day: 1.00    Years: 15.00    Pack years: 15.00    Types: Cigarettes  . Smokeless tobacco: Current User  Substance and Sexual Activity  . Alcohol use: No    Alcohol/week: 0.0 oz  . Drug use: No  . Sexual activity: Not on file  Lifestyle  . Physical activity:    Days per week: Not on file    Minutes per session: Not on file  . Stress: Not on file  Relationships  . Social connections:    Talks on phone: Not on file    Gets together: Not on file    Attends religious service: Not on file    Active member of club or organization: Not on file    Attends meetings of clubs or organizations: Not on file    Relationship status: Not on file  . Intimate partner violence:    Fear of current or ex partner: Not on file    Emotionally abused: Not on file    Physically abused: Not on file    Forced sexual activity: Not on file  Other Topics Concern  . Not on file  Social History Narrative  . Not on file   Family History  Problem Relation Age of Onset  . Diabetes Mother   . Emphysema Father    Current Outpatient Medications on File Prior to Visit  Medication Sig  . aspirin 81 MG tablet Take 81 mg by mouth daily.  . carvedilol (COREG) 25 MG tablet Take 1 tablet (25 mg total) by mouth 2 (two) times daily with a meal.  . clopidogrel (PLAVIX) 75 MG tablet Take 1 tablet (75 mg total) by mouth daily.  . Fish Oil-Cholecalciferol (FISH OIL + D3 PO) Take by mouth.  Marland Kitchen FLUoxetine (PROZAC) 40 MG capsule Take 1 capsule (40 mg total) by mouth daily.  Marland Kitchen lisinopril-hydrochlorothiazide (PRINZIDE,ZESTORETIC) 10-12.5 MG tablet Take 1 tablet by mouth daily.  Marland Kitchen lovastatin (MEVACOR) 40 MG tablet Take 1 tablet (40 mg total) by mouth at bedtime.  Marland Kitchen omeprazole (PRILOSEC) 20 MG capsule Take 20 mg by mouth daily.    No current facility-administered medications on file prior to visit.     Review of Systems  Constitutional: Negative for activity change, appetite change, chills, diaphoresis, fatigue  and fever.  HENT: Negative for congestion and hearing loss.   Eyes: Negative for visual disturbance.  Respiratory: Negative for apnea, cough, choking, chest tightness, shortness of breath and wheezing.   Cardiovascular: Negative for chest pain, palpitations and leg swelling.  Gastrointestinal: Negative for abdominal pain, anal bleeding, blood in stool, constipation, diarrhea, nausea and vomiting.  Endocrine: Negative for cold intolerance.  Genitourinary: Negative for decreased urine volume, difficulty urinating, dysuria, frequency, hematuria, testicular pain and urgency.  Musculoskeletal: Negative for arthralgias, back pain and neck pain.  Skin: Negative for rash.  Allergic/Immunologic: Negative for environmental allergies.  Neurological: Negative  for dizziness, weakness, light-headedness, numbness and headaches.  Hematological: Negative for adenopathy.  Psychiatric/Behavioral: Negative for behavioral problems, decreased concentration, dysphoric mood and sleep disturbance. The patient is not nervous/anxious.    Per HPI unless specifically indicated above     Objective:    BP 130/84 (BP Location: Left Arm, Cuff Size: Normal)   Pulse 76   Temp 98.1 F (36.7 C) (Oral)   Resp 16   Ht 5\' 9"  (1.753 m)   Wt 194 lb (88 kg)   BMI 28.65 kg/m   Wt Readings from Last 3 Encounters:  10/01/17 194 lb (88 kg)  05/28/17 190 lb (86.2 kg)  08/22/16 190 lb (86.2 kg)    Physical Exam  Constitutional: He is oriented to person, place, and time. He appears well-developed and well-nourished. No distress.  Well-appearing, comfortable, cooperative  HENT:  Head: Normocephalic and atraumatic.  Mouth/Throat: Oropharynx is clear and moist.  Eyes: Pupils are equal, round, and reactive to light. Conjunctivae and EOM are normal. Right eye exhibits no discharge. Left eye exhibits no discharge.  Neck: Normal range of motion. Neck supple. No thyromegaly present.  Cardiovascular: Normal rate, regular rhythm,  normal heart sounds and intact distal pulses.  No murmur heard. Pulmonary/Chest: Effort normal and breath sounds normal. No respiratory distress. He has no wheezes. He has no rales.  Abdominal: Soft. Bowel sounds are normal. He exhibits no distension and no mass. There is no tenderness.  Musculoskeletal: Normal range of motion. He exhibits no edema or tenderness.  Upper / Lower Extremities: - Normal muscle tone, strength bilateral upper extremities 5/5, lower extremities 5/5  Lymphadenopathy:    He has no cervical adenopathy.  Neurological: He is alert and oriented to person, place, and time.  Distal sensation intact to light touch all extremities  Skin: Skin is warm and dry. No rash noted. He is not diaphoretic. No erythema.  Psychiatric: He has a normal mood and affect. His behavior is normal.  Well groomed, good eye contact, normal speech and thoughts  Nursing note and vitals reviewed.  Results for orders placed or performed in visit on 09/26/17  Lipid panel  Result Value Ref Range   Cholesterol 216 (H) <200 mg/dL   HDL 37 (L) >40 mg/dL   Triglycerides 247 (H) <150 mg/dL   LDL Cholesterol (Calc) 139 (H) mg/dL (calc)   Total CHOL/HDL Ratio 5.8 (H) <5.0 (calc)   Non-HDL Cholesterol (Calc) 179 (H) <130 mg/dL (calc)  Hemoglobin A1c  Result Value Ref Range   Hgb A1c MFr Bld 6.9 (H) <5.7 % of total Hgb   Mean Plasma Glucose 151 (calc)   eAG (mmol/L) 8.4 (calc)  CBC with Differential/Platelet  Result Value Ref Range   WBC 7.8 3.8 - 10.8 Thousand/uL   RBC 4.69 4.20 - 5.80 Million/uL   Hemoglobin 14.7 13.2 - 17.1 g/dL   HCT 41.0 38.5 - 50.0 %   MCV 87.4 80.0 - 100.0 fL   MCH 31.3 27.0 - 33.0 pg   MCHC 35.9 32.0 - 36.0 g/dL   RDW 12.1 11.0 - 15.0 %   Platelets 263 140 - 400 Thousand/uL   MPV 9.3 7.5 - 12.5 fL   Neutro Abs 4,477 1,500 - 7,800 cells/uL   Lymphs Abs 2,441 850 - 3,900 cells/uL   WBC mixed population 515 200 - 950 cells/uL   Eosinophils Absolute 273 15 - 500 cells/uL    Basophils Absolute 94 0 - 200 cells/uL   Neutrophils Relative % 57.4 %   Total  Lymphocyte 31.3 %   Monocytes Relative 6.6 %   Eosinophils Relative 3.5 %   Basophils Relative 1.2 %  COMPLETE METABOLIC PANEL WITH GFR  Result Value Ref Range   Glucose, Bld 127 (H) 65 - 99 mg/dL   BUN 12 7 - 25 mg/dL   Creat 0.78 0.70 - 1.33 mg/dL   GFR, Est Non African American 100 > OR = 60 mL/min/1.53m2   GFR, Est African American 116 > OR = 60 mL/min/1.36m2   BUN/Creatinine Ratio NOT APPLICABLE 6 - 22 (calc)   Sodium 139 135 - 146 mmol/L   Potassium 4.5 3.5 - 5.3 mmol/L   Chloride 101 98 - 110 mmol/L   CO2 29 20 - 32 mmol/L   Calcium 9.0 8.6 - 10.3 mg/dL   Total Protein 6.9 6.1 - 8.1 g/dL   Albumin 4.3 3.6 - 5.1 g/dL   Globulin 2.6 1.9 - 3.7 g/dL (calc)   AG Ratio 1.7 1.0 - 2.5 (calc)   Total Bilirubin 0.5 0.2 - 1.2 mg/dL   Alkaline phosphatase (APISO) 80 40 - 115 U/L   AST 37 (H) 10 - 35 U/L   ALT 43 9 - 46 U/L  PSA, Total with Reflex to PSA, Free  Result Value Ref Range   PSA, Total 0.6 < OR = 4.0 ng/mL   Recent Labs    09/30/17 0857  HGBA1C 6.9*       Assessment & Plan:   Problem List Items Addressed This Visit    CAD (coronary artery disease)    Stable without angina Chronic problem with 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 No longer followed by [facility] Cardiology Dr Saralyn Pilar, last 2016 On Plavix ASA, BB, ACEi, Statin - Recommend future follow-up with Cardiology       Essential hypertension    Mild elevated BP, manual re-check improved - Home BP readings limited Complication with known CAD    Plan:  1. Continue current BP regimen - Carvedilol 25mg  BID, Lisinoipril-HCTZ 10-12.5mg  daily 2. Encourage improved lifestyle - low sodium diet, regular exercise improve 3. Start monitor BP outside office, bring readings to next visit, if persistently >140/90 or new symptoms notify office sooner - Smoking cessation  4. Follow-up 3 mo      Fatty infiltration of liver    Suspected cause of  prior elevated LFT Now LFT improved, mild elevated AST Still abnormal cholesterol Continue statin and meds Encourage lifestyle improvement Treat DM      Hyperlipidemia    Previously controlled cholesterol on statin and lifestyle, now abnormal results Last lipid panel 09/2017 New dx DM ASCVD with known CAD  Plan: 1. Recommend to continue Lovastatin 40mg  daily 2. Continue DAPT Plavix + ASA 81mg  for secondary ASCVD risk reduction 3. Encourage improved lifestyle - low carb/cholesterol, reduce portion size, continue improving regular exercise 4. Follow-up w/ cardiology      Type 2 diabetes mellitus with other specified complication ([place])    New diagnosis Type 2 DM, A1c in range at 6.9 previously elevated from 6.5 Complications - Hyperlipidemia and CAD Strong fam history of DM  Plan:  1. Discussion on DM - see below - Start med Metformin 500mg  BID wc - initially only 1 tab nightly with dinner then inc dose as tolerated 2. Encourage improved lifestyle - low carb, low sugar diet, reduce portion size, recommend to start regular exercise - detailed discussion, handout given on diet and info - Offered refer [facility] Nutritionist but he declined 3. Continue ASA, ACEi, Statin 5.  Due at next DM visit - DM Foot exam / Advised to schedule DM ophtho exam, send record - handout given - Will offer pneumovax-23 for DM patient under age 62 6. Follow-up 3 months DM A1c      Relevant Medications   metFORMIN (GLUCOPHAGE) 500 MG tablet    Other Visit Diagnoses    Annual physical exam    -  Primary Reviewed and updated health maintenance Reviewed labs Encourage improve lifestyle diet exercise, see above      Meds ordered this encounter  Medications  . metFORMIN (GLUCOPHAGE) 500 MG tablet    Sig: Take 1 tablet (500 mg total) by mouth 2 (two) times daily with a meal. Start with only 1 at dinner for first few weeks, then add 2nd dose    Dispense:  180 tablet    Refill:  2    Follow up  plan: Return in about 3 months (around 12/31/2017) for Diabetes A1c.  A total of >25, 40 minutes was spent face-to-face with this patient. Greater than 50% of this time was spent in counseling on new diagnosis type 2 diabetes, complications, management options medications including new metformin, diet and exercise lifestyle.  Nobie Putnam, DO [facility] 10/01/2017, 1:23 PM

## 2017-10-01 NOTE — Assessment & Plan Note (Signed)
Previously controlled cholesterol on statin and lifestyle, now abnormal results Last lipid panel 09/2017 New dx DM ASCVD with known CAD  Plan: 1. Recommend to continue Lovastatin 40mg  daily 2. Continue DAPT Plavix + ASA 81mg  for secondary ASCVD risk reduction 3. Encourage improved lifestyle - low carb/cholesterol, reduce portion size, continue improving regular exercise 4. Follow-up w/ cardiology

## 2017-10-01 NOTE — Assessment & Plan Note (Signed)
Suspected cause of prior elevated LFT Now LFT improved, mild elevated AST Still abnormal cholesterol Continue statin and meds Encourage lifestyle improvement Treat DM

## 2017-10-01 NOTE — Assessment & Plan Note (Signed)
Stable currently, chronic problem, mixed anxiety/depression Failed Wellbutrin, Cymbalta. Off Xanax Refilled Fluoxetine 40mg  daily

## 2017-10-01 NOTE — Patient Instructions (Addendum)
Thank you for coming to the office today.  Concern may be at risk of Type 2 Diabetes, A1c 6.9  Start Metformin for lowering sugar, take 1 pill with dinner for few weeks, if tolerating then add 2nd pill with breakfast  Your provider would like to you have your annual eye exam. Please contact your current eye doctor or here are some good options for you to contact.   [facility]   Address: [address] [place], [place] [address] Phone: [phone]  Website: [email or web address] [address] [place], [place] [address] Phone: [phone] [email or web address]  [facility]  Address: [place], [place], [place] [address] Phone: [phone]   [facility] [address] [place], [place] [address] Phone: [phone]  [facility] Address: [place], [place], [place] [address]  Phone: [phone]   Colon Cancer Screening: - For all adults age 35+ routine colon cancer screening is highly recommended.     - Recent guidelines from [place] recommend starting age of 85 - Early detection of colon cancer is important, because often there are no warning signs or symptoms, also if found early usually it can be cured. Late stage is hard to treat.  - If you are not interested in Colonoscopy screening (if done and normal you could be cleared for 5 to 10 years until next due), then Cologuard is an excellent alternative for screening test for Colon Cancer. It is highly sensitive for detecting DNA of colon cancer from even the earliest stages. Also, there is NO bowel prep required. - If Cologuard is NEGATIVE, then it is good for 3 years before next due - If Cologuard is POSITIVE, then it is strongly advised to get a Colonoscopy, which allows the GI doctor to locate the source of the cancer or polyp (even very early stage) and treat it by removing it. ------------------------- If you would like to proceed with Cologuard  (stool DNA test) - FIRST, call your insurance company and tell them you want to check cost of Cologuard tell them CPT Code [phone] (it may be completely covered and you could get for no cost, OR max cost without any coverage is about $600). Also, keep in mind if you do NOT open the kit, and decide not to do the test, you will NOT be charged, you should contact the company if you decide not to do the test. - If you want to proceed, you can notify us (phone message, [place], or at next visit) and we will order it for you. The test kit will be delivered to you house within about 1 week. Follow instructions to collect sample, you may call the company for any help or questions, 24/7 telephone support at [phone].   Please schedule a Follow-up Appointment to: Return in about 3 months (around 12/31/2017) for Diabetes A1c.  If you have any other questions or concerns, please feel free to call the office or send a message through [place]. You may also schedule an earlier appointment if necessary.  Additionally, you may be receiving a survey about your experience at our office within a few days to 1 week by e-mail or mail. We value your feedback.  Nobie Putnam, DO [place]

## 2017-10-01 NOTE — Assessment & Plan Note (Signed)
Mild elevated BP, manual re-check improved - Home BP readings limited Complication with known CAD    Plan:  1. Continue current BP regimen - Carvedilol 25mg  BID, Lisinoipril-HCTZ 10-12.5mg  daily 2. Encourage improved lifestyle - low sodium diet, regular exercise improve 3. Start monitor BP outside office, bring readings to next visit, if persistently >140/90 or new symptoms notify office sooner - Smoking cessation  4. Follow-up 3 mo

## 2017-10-01 NOTE — Assessment & Plan Note (Signed)
New diagnosis Type 2 DM, A1c in range at 6.9 previously elevated from 6.5 Complications - Hyperlipidemia and CAD Strong fam history of DM  Plan:  1. Discussion on DM - see below - Start med Metformin 500mg  BID wc - initially only 1 tab nightly with dinner then inc dose as tolerated 2. Encourage improved lifestyle - low carb, low sugar diet, reduce portion size, recommend to start regular exercise - detailed discussion, handout given on diet and info - Offered refer [facility] Nutritionist but he declined 3. Continue ASA, ACEi, Statin 5. Due at next DM visit - DM Foot exam / Advised to schedule DM ophtho exam, send record - handout given - Will offer pneumovax-23 for DM patient under age 74 6. Follow-up 3 months DM A1c

## 2017-10-01 NOTE — Assessment & Plan Note (Signed)
Stable without angina Chronic problem with 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 No longer followed by [facility] Cardiology Dr Saralyn Pilar, last 2016 On Plavix ASA, BB, ACEi, Statin - Recommend future follow-up with Cardiology

## 2017-12-31 ENCOUNTER — Ambulatory Visit: Payer: Self-pay | Admitting: Nurse Practitioner

## 2018-03-07 ENCOUNTER — Other Ambulatory Visit: Payer: Self-pay

## 2018-03-07 ENCOUNTER — Emergency Department
Admission: EM | Admit: 2018-03-07 | Discharge: 2018-03-07 | Disposition: A | Discharge status: Home / Self Care | Payer: Self-pay | Attending: Emergency Medicine | Admitting: Emergency Medicine

## 2018-03-07 ENCOUNTER — Encounter: Payer: Self-pay | Admitting: Emergency Medicine

## 2018-03-07 DIAGNOSIS — I252 Old myocardial infarction: Secondary | ICD-10-CM | POA: Insufficient documentation

## 2018-03-07 DIAGNOSIS — J45909 Unspecified asthma, uncomplicated: Secondary | ICD-10-CM | POA: Insufficient documentation

## 2018-03-07 DIAGNOSIS — E119 Type 2 diabetes mellitus without complications: Secondary | ICD-10-CM | POA: Insufficient documentation

## 2018-03-07 DIAGNOSIS — Z7984 Long term (current) use of oral hypoglycemic drugs: Secondary | ICD-10-CM | POA: Insufficient documentation

## 2018-03-07 DIAGNOSIS — Z7982 Long term (current) use of aspirin: Secondary | ICD-10-CM | POA: Insufficient documentation

## 2018-03-07 DIAGNOSIS — I2510 Atherosclerotic heart disease of native coronary artery without angina pectoris: Secondary | ICD-10-CM | POA: Insufficient documentation

## 2018-03-07 DIAGNOSIS — K029 Dental caries, unspecified: Secondary | ICD-10-CM | POA: Insufficient documentation

## 2018-03-07 DIAGNOSIS — F17210 Nicotine dependence, cigarettes, uncomplicated: Secondary | ICD-10-CM | POA: Insufficient documentation

## 2018-03-07 DIAGNOSIS — K047 Periapical abscess without sinus: Secondary | ICD-10-CM | POA: Insufficient documentation

## 2018-03-07 DIAGNOSIS — Z7901 Long term (current) use of anticoagulants: Secondary | ICD-10-CM | POA: Insufficient documentation

## 2018-03-07 MED ORDER — AMOXICILLIN 500 MG PO CAPS
500.0000 mg | ORAL_CAPSULE | Freq: Once | ORAL | Status: AC
  Administered 2018-03-07: 500 mg via ORAL
  Filled 2018-03-07: qty 1

## 2018-03-07 MED ORDER — AMOXICILLIN 500 MG PO CAPS: 500.0000 mg | ORAL_CAPSULE | Freq: Three times a day (TID) | ORAL | 0 refills | Status: DC

## 2018-03-07 NOTE — ED Notes (Signed)
First Nurse Note: Patient complaining of abscess tooth pain, upper left jaw.  Alert and oriented, NAD.

## 2018-03-07 NOTE — ED Triage Notes (Signed)
Patient complaining of right sided facial swelling, has upper jaw toothache X 3 days with swelling today.

## 2018-03-07 NOTE — ED Provider Notes (Signed)
[facility] Emergency Department Provider Note ____________________________________________  Time seen: 1016  I have reviewed the triage vital signs and the nursing notes.  HISTORY  Chief Complaint  Oral Swelling  HPI Clifford Morse is a 57 y.o. male presents to the ED for evaluation of left upper facial pain and swelling.  Patient with a history of poor dentition, and multiple broken teeth, presents with some sudden facial swelling.  He reports onset of minor swelling yesterday, but awoke with significant swelling to the face at the nose on the left side.  Denies any interim fevers, chills, sweats.  He also is denying any spontaneous purulent drainage, nausea, vomiting, or chest pains.  Past Medical History:  Diagnosis Date  . Allergy   . Arthritis   . Asthma   . Depression   . GERD (gastroesophageal reflux disease)   . Hyperlipidemia   . Myocardial infarction [facility]) 2014    Patient Active Problem List   Diagnosis Date Noted  . Type 2 diabetes mellitus with other specified complication ([place]) 76/22/6333  . Hyperlipidemia 05/28/2017  . CAD (coronary artery disease) 03/21/2016  . Skin lesion of face 12/07/2015  . Seasonal allergies 12/07/2015  . Tobacco abuse 06/20/2015  . Anxiety and depression 06/20/2015  . Fatty infiltration of liver 05/19/2015  . GERD (gastroesophageal reflux disease) 05/19/2015  . Essential hypertension 05/19/2015  . History of cardiac catheterization 05/19/2015    History reviewed. No pertinent surgical history.  Prior to Admission medications   Medication Sig Start Date End Date Taking? Authorizing Provider  amoxicillin (AMOXIL) 500 MG capsule Take 1 capsule (500 mg total) by mouth 3 (three) times daily. 03/07/18   Adrianna Dudas, Dannielle Karvonen, PA-C  aspirin 81 MG tablet Take 81 mg by mouth daily.    [provider]  carvedilol (COREG) 25 MG tablet Take 1 tablet (25 mg total) by mouth 2 (two) times daily with a meal. 05/28/17    Karamalegos, Devonne Doughty, DO  clopidogrel (PLAVIX) 75 MG tablet Take 1 tablet (75 mg total) by mouth daily. 05/28/17   Karamalegos, Devonne Doughty, DO  Fish Oil-Cholecalciferol (FISH OIL + D3 PO) Take by mouth.    [provider]  FLUoxetine (PROZAC) 40 MG capsule Take 1 capsule (40 mg total) by mouth daily. 05/28/17   Karamalegos, Devonne Doughty, DO  lisinopril-hydrochlorothiazide (PRINZIDE,ZESTORETIC) 10-12.5 MG tablet Take 1 tablet by mouth daily. 05/28/17   Karamalegos, Devonne Doughty, DO  lovastatin (MEVACOR) 40 MG tablet Take 1 tablet (40 mg total) by mouth at bedtime. 05/28/17   Karamalegos, Devonne Doughty, DO  metFORMIN (GLUCOPHAGE) 500 MG tablet Take 1 tablet (500 mg total) by mouth 2 (two) times daily with a meal. Start with only 1 at dinner for first few weeks, then add 2nd dose 10/01/17   Karamalegos, Alexander J, DO  omeprazole (PRILOSEC) 20 MG capsule Take 20 mg by mouth daily.     [provider]    Allergies Patient has no known allergies.  Family History  Problem Relation Age of Onset  . Emphysema Father   . Diabetes Mother     Social History Social History   Tobacco Use  . Smoking status: Current Every Day Smoker    Packs/day: 1.00    Years: 15.00    Pack years: 15.00    Types: Cigarettes  . Smokeless tobacco: Current User  Substance Use Topics  . Alcohol use: No    Alcohol/week: 0.0 standard drinks  . Drug use: No  Review of Systems  Constitutional: Negative for fever. Eyes: Negative for visual changes. ENT: Negative for sore throat.  Left upper dental pain as above. Cardiovascular: Negative for chest pain. Respiratory: Negative for shortness of breath. Gastrointestinal: Negative for abdominal pain, vomiting and diarrhea. Musculoskeletal: Negative for back pain. Neurological: Negative for headaches, focal weakness or numbness. ____________________________________________  PHYSICAL EXAM:  VITAL SIGNS: ED Triage Vitals  Enc Vitals Group      BP 03/07/18 0947 (!) 168/93     Pulse --      Resp 03/07/18 0947 16     Temp 03/07/18 0947 98.2 F (36.8 C)     Temp Source 03/07/18 0947 Oral     SpO2 03/07/18 0947 97 %     Weight 03/07/18 0948 185 lb (83.9 kg)     Height 03/07/18 0948 5\' 9"  (1.753 m)     Head Circumference --      Peak Flow --      Pain Score 03/07/18 0948 4     Pain Loc --      Pain Edu? --      Excl. in [place]? --     Constitutional: Alert and oriented. Well appearing and in no distress. Head: Normocephalic and atraumatic. Eyes: Conjunctivae are normal. PERRL. Normal extraocular movements Ears: Canals clear. TMs intact bilaterally. Nose: No congestion/rhinorrhea/epistaxis. Mouth/Throat: Mucous membranes are moist.  Uvula is midline and tonsils are flat.  No oropharyngeal lesions are appreciated.  No brawny sublingual erythema noted.  Patient with multiple broken teeth with decay to the gumline.  No focal gum swelling, purulence, or pointing is appreciated.  Patient localizes pain to the left upper primary canine. Neck: Supple. No thyromegaly. Hematological/Lymphatic/Immunological: No cervical lymphadenopathy. Cardiovascular: Normal rate, regular rhythm. Normal distal pulses. Respiratory: Normal respiratory effort. No wheezes/rales/rhonchi. ____________________________________________  PROCEDURES  Procedures Amoxicillin 500 mg PO ____________________________________________  INITIAL IMPRESSION / ASSESSMENT AND PLAN / ED COURSE  Patient with ED evaluation of facial swelling and left upper dental pain.  Patient treated empirically for dental abscess with amoxicillin.  He will be discharged with a prescription to dose as directed and will follow-up with his dental provider for definitive management. ____________________________________________  FINAL CLINICAL IMPRESSION(S) / ED DIAGNOSES  Final diagnoses:  Dental caries      Melvenia Needles, PA-C 03/07/18 [place], [place],  MD 03/10/18 1534

## 2018-03-07 NOTE — Discharge Instructions (Addendum)
Take the antibiotic as directed. Rinse with warm salty water after each meal. Use a soft-bristled toothbrush twice daily. Change your toothbrush after 24-hours. Follow-up with your dental provider for definitive management.

## 2018-06-17 ENCOUNTER — Other Ambulatory Visit: Payer: Self-pay | Admitting: Family Medicine

## 2018-06-17 DIAGNOSIS — I2510 Atherosclerotic heart disease of native coronary artery without angina pectoris: Secondary | ICD-10-CM

## 2018-06-17 DIAGNOSIS — I10 Essential (primary) hypertension: Secondary | ICD-10-CM

## 2018-06-19 ENCOUNTER — Other Ambulatory Visit: Payer: Self-pay | Admitting: Family Medicine

## 2018-06-19 ENCOUNTER — Telehealth: Payer: Self-pay

## 2018-06-19 ENCOUNTER — Telehealth: Payer: Self-pay | Admitting: Family Medicine

## 2018-06-19 DIAGNOSIS — I2510 Atherosclerotic heart disease of native coronary artery without angina pectoris: Secondary | ICD-10-CM

## 2018-06-19 DIAGNOSIS — I10 Essential (primary) hypertension: Secondary | ICD-10-CM

## 2018-06-19 MED ORDER — CLOPIDOGREL BISULFATE 75 MG PO TABS: 75.0000 mg | ORAL_TABLET | Freq: Every day | ORAL | 5 refills | Status: DC

## 2018-06-19 MED ORDER — CARVEDILOL 25 MG PO TABS: 25.0000 mg | ORAL_TABLET | Freq: Two times a day (BID) | ORAL | 5 refills | Status: DC

## 2018-06-19 NOTE — Telephone Encounter (Signed)
Pt needs refills on carvedilol and clopidogrel sent to [facility].  His call back [phone]

## 2018-06-19 NOTE — Telephone Encounter (Signed)
I sent the medications over to the patient pharmacy because the patient called requesting refills on the these two medications. He had a physical in 09/2017 and also the medications was still listed in the pt medication list so sent the refill.  After doing more search I released that Dr. Raliegh Ip denied the medications and that his Carvedilol was discontinued. I contacted [place] spoke with Tiffany and d/c both prescriptions.

## 2018-06-19 NOTE — Telephone Encounter (Signed)
The pt refill was sent over to [facility]. I attempted to contact the pt to notify him. His phone number is busy.

## 2018-06-19 NOTE — Telephone Encounter (Signed)
Meds are cancelled.  Ok without any other needed action.

## 2018-06-25 ENCOUNTER — Telehealth: Payer: Self-pay

## 2018-06-25 DIAGNOSIS — I2510 Atherosclerotic heart disease of native coronary artery without angina pectoris: Secondary | ICD-10-CM

## 2018-06-25 MED ORDER — CLOPIDOGREL BISULFATE 75 MG PO TABS: 75.0000 mg | ORAL_TABLET | Freq: Every day | ORAL | 5 refills | Status: DC

## 2018-06-25 NOTE — Telephone Encounter (Signed)
As per Dr. Marthann Schiller note patient is still on clopidogrel and he has appointment scheduled on 07/03/2017. Patient is requesting this refill.

## 2018-06-25 NOTE — Telephone Encounter (Signed)
Pt called requesting a call back for refill on clopidogrel. Pt call bck # is  [phone]

## 2018-07-03 ENCOUNTER — Ambulatory Visit: Payer: Self-pay | Admitting: Family Medicine

## 2018-07-03 ENCOUNTER — Encounter: Payer: Self-pay | Admitting: Family Medicine

## 2018-07-03 VITALS — BP 134/80 | HR 85 | Temp 98.3°F | Resp 16 | Ht 69.0 in | Wt 192.0 lb

## 2018-07-03 DIAGNOSIS — I10 Essential (primary) hypertension: Secondary | ICD-10-CM

## 2018-07-03 DIAGNOSIS — F419 Anxiety disorder, unspecified: Secondary | ICD-10-CM | POA: Insufficient documentation

## 2018-07-03 DIAGNOSIS — E1169 Type 2 diabetes mellitus with other specified complication: Secondary | ICD-10-CM

## 2018-07-03 DIAGNOSIS — G894 Chronic pain syndrome: Secondary | ICD-10-CM

## 2018-07-03 DIAGNOSIS — E785 Hyperlipidemia, unspecified: Secondary | ICD-10-CM

## 2018-07-03 DIAGNOSIS — F331 Major depressive disorder, recurrent, moderate: Secondary | ICD-10-CM

## 2018-07-03 DIAGNOSIS — I2510 Atherosclerotic heart disease of native coronary artery without angina pectoris: Secondary | ICD-10-CM

## 2018-07-03 MED ORDER — LOVASTATIN 40 MG PO TABS: 40.0000 mg | ORAL_TABLET | Freq: Every day | ORAL | 1 refills | Status: DC

## 2018-07-03 MED ORDER — LISINOPRIL-HYDROCHLOROTHIAZIDE 10-12.5 MG PO TABS: 1.0000 | ORAL_TABLET | Freq: Every day | ORAL | 1 refills | Status: DC

## 2018-07-03 MED ORDER — DULOXETINE HCL 30 MG PO CPEP: 30.0000 mg | ORAL_CAPSULE | Freq: Every day | ORAL | 0 refills | Status: DC

## 2018-07-03 MED ORDER — CARVEDILOL 25 MG PO TABS: 25.0000 mg | ORAL_TABLET | Freq: Two times a day (BID) | ORAL | 1 refills | Status: DC

## 2018-07-03 MED ORDER — METFORMIN HCL 500 MG PO TABS: 500.0000 mg | ORAL_TABLET | Freq: Two times a day (BID) | ORAL | 1 refills | Status: DC

## 2018-07-03 NOTE — Patient Instructions (Addendum)
Thank you for coming to the office today.  For mood - taper off Fluoxetine for 1 week - take one capsule every other day for 1 week, then start new Duloxetine 30mg  daily - should be $15 from [place]. After nearly finish 30 days call us to request new rx for INCREASED to 60mg  duloxetine we will send new rx to pharmacy, and continue that with refills for up to 4-6 months until you come back.  Most likely some numbness and tingling in the feet is from elevated blood sugars with diabetes  Your provider would like to you have your annual eye exam. Please contact your current eye doctor for an appointment - make sure that he knows you have Diabetes and that they should send [place] a copy of this report  [facility]   Address: [address], [place] [address] Phone: [phone]  Website: [email or web address]  ---------------------------------------------  If interested in Colonoscopy screening - you may qualify for a discounted or free screening if you complete a financial assistance application through the GI doctors.  [facility]) [address] [place], [place] [address] Phone: [phone]   Please schedule a Follow-up Appointment to: Return in about 6 months (around 01/01/2019) for 6 month follow-up DM A1c, Depression/Anxiety, HTN, med adjust.  If you have any other questions or concerns, please feel free to call the office or send a message through [place]. You may also schedule an earlier appointment if necessary.  Additionally, you may be receiving a survey about your experience at our office within a few days to 1 week by e-mail or mail. We value your feedback.  Nobie Putnam, DO [place]

## 2018-07-03 NOTE — Progress Notes (Signed)
Subjective:    Patient ID: Clifford Morse, male    DOB: Apr 22, 1961, 58 y.o.   MRN: [id number]  Clifford Morse is a 58 y.o. male presenting on 07/03/2018 for Hypertension   HPI   CHRONIC HTN: Reportslong history of HTN. States he does not check BP outside office. Current Meds -Carvedilol 25mg  BID (last dose yesterday, just ran out), Lisinopril-HCTZ 10-12.5mg  daily (unsure if refills) Reports good compliance,occasional miss dose.Tolerating well, w/o complaints.  HYPERLIPIDEMIA/ CADs/p stent - Past history of known CAD s/p MI with stent in 03/02/13 DES in LAD, previously [facility] Dr Saralyn Pilar, last seen 05/2015,  Has not returned. - Today reports nonewconcerns. Last lipid panel4/2019, uncontrolled, elevated LDL and TG - Taking Lovastatin 40mg  nightly, has still missed some doses - Taking ASA 81 and Plavix - Still taking Fish Oil occasionally  ChronicAnxiety / Depression recurrent currently active He reports admits some generalized pain and muscle ache and feels that this is related to his depression and can make it worse. See PHQ GAD score below, recent worsening mood with pain.  DM, Type 2 / Possible neuropathy complication Last V8L 6.9 CBGs: Not checking Meds: Metformin 500 BID Currently on ACEi already Lifestyle: - Diet (admits needs to improve diabetic diet  - Exercise (no regular exercise, active with work) Admits occasional foot pain and stinging Denies hypoglycemia, polyuria, visual changes, numbness or tingling.  Tobacco abuse Active smoker, 1ppd >15 years. Recently reduced amount smoking now that grandchild moved in to live with him. He smokes outside. Not interested in quitting at this time.    Depression screen [facility] 2/9 07/03/2018 10/01/2017 05/28/2017  Decreased Interest 3 0 1  Down, Depressed, Hopeless 2 1 0  PHQ - 2 Score 5 1 1   Altered sleeping 3 2 1   Tired, decreased energy 3 2 1   Change in appetite 2 1 0  Feeling bad or failure about  yourself  0 0 0  Trouble concentrating 1 0 0  Moving slowly or fidgety/restless - 0 0  Suicidal thoughts 0 0 0  PHQ-9 Score 14 6 3   Difficult doing work/chores Somewhat difficult Not difficult at all Somewhat difficult   GAD 7 : Generalized Anxiety Score 07/03/2018 05/28/2017 03/21/2016  Nervous, Anxious, on Edge 1 0 0  Control/stop worrying 0 0 0  Worry too much - different things 1 0 0  Trouble relaxing 2 0 0  Restless 0 0 1  Easily annoyed or irritable 2 0 0  Afraid - awful might happen 1 0 0  Total GAD 7 Score 7 0 1  Anxiety Difficulty Somewhat difficult Not difficult at all Not difficult at all    Social History   Tobacco Use  . Smoking status: Current Every Day Smoker    Packs/day: 1.00    Years: 15.00    Pack years: 15.00    Types: Cigarettes  . Smokeless tobacco: Current User  Substance Use Topics  . Alcohol use: No    Alcohol/week: 0.0 standard drinks  . Drug use: No    Review of Systems Per HPI unless specifically indicated above     Objective:    BP 134/80 (BP Location: Left Arm, Cuff Size: Normal)   Pulse 85   Temp 98.3 F (36.8 C) (Oral)   Resp 16   Ht 5\' 9"  (1.753 m)   Wt 192 lb (87.1 kg)   BMI 28.35 kg/m   Wt Readings from Last 3 Encounters:  07/03/18 192 lb (87.1 kg)  03/07/18 185 lb (83.9 kg)  10/01/17 194 lb (88 kg)    Physical Exam Vitals signs and nursing note reviewed.  Constitutional:      General: He is not in acute distress.    Appearance: He is well-developed. He is not diaphoretic.     Comments: Well-appearing, comfortable, cooperative  HENT:     Head: Normocephalic and atraumatic.  Eyes:     General:        Right eye: No discharge.        Left eye: No discharge.     Conjunctiva/sclera: Conjunctivae normal.  Neck:     Musculoskeletal: Normal range of motion and neck supple.     Thyroid: No thyromegaly.  Cardiovascular:     Rate and Rhythm: Normal rate and regular rhythm.     Heart sounds: Normal heart sounds. No murmur.    Pulmonary:     [place]: Pulmonary effort is normal. No respiratory distress.     Breath sounds: Normal breath sounds. No wheezing or rales.  Musculoskeletal: Normal range of motion.  Lymphadenopathy:     Cervical: No cervical adenopathy.  Skin:    General: Skin is warm and dry.     Findings: No erythema or rash.  Neurological:     Mental Status: He is alert and oriented to person, place, and time.  Psychiatric:        Behavior: Behavior normal.     Comments: Well groomed, good eye contact, normal speech and thoughts      Diabetic Foot Exam - Simple   Simple Foot Form Diabetic Foot exam was performed with the following findings:  Yes 07/03/2018 10:58 AM  Visual Inspection No deformities, no ulcerations, no other skin breakdown bilaterally:  Yes Sensation Testing Intact to touch and monofilament testing bilaterally:  Yes Pulse Check Posterior Tibialis and Dorsalis pulse intact bilaterally:  Yes Comments     Results for orders placed or performed in visit on 07/03/18  POCT glycosylated hemoglobin (Hb A1C)  Result Value Ref Range   Hemoglobin A1C 7.2 (A) 4.0 - 5.6 %      Assessment & Plan:   Problem List Items Addressed This Visit    Anxiety   Relevant Medications   DULoxetine (CYMBALTA) 30 MG capsule   CAD (coronary artery disease) Prior history. WIthout any symptoms of chest pain or angina at this time Not followed by Cardiology On Medical management currently Refill meds    Relevant Medications   lisinopril-hydrochlorothiazide (PRINZIDE,ZESTORETIC) 10-12.5 MG tablet   carvedilol (COREG) 25 MG tablet   lovastatin (MEVACOR) 40 MG tablet      Essential hypertension Mild elevated BP Refill current medications    Relevant Medications   lisinopril-hydrochlorothiazide (PRINZIDE,ZESTORETIC) 10-12.5 MG tablet   carvedilol (COREG) 25 MG tablet   lovastatin (MEVACOR) 40 MG tablet   Hyperlipidemia Will be due for future fasting lipids Known CAD On Statin Refill  Lovastatin    Relevant Medications   lisinopril-hydrochlorothiazide (PRINZIDE,ZESTORETIC) 10-12.5 MG tablet   carvedilol (COREG) 25 MG tablet   lovastatin (MEVACOR) 40 MG tablet  Chronic pain syndrome   Major depressive disorder with current active episode  Complicating factor for mood/anxiety Generalized pain including MSK muscular/arthritis Prior trial failed Cymbalta for anxiety, but now advised to re-try SNRI Duloxetine generic for both mood/depression and anxiety as well as chronic pain/neuropathy - Taper off Fluoxetine as advised and then start Duloxetine    Relevant Medications   DULoxetine (CYMBALTA) 30 MG capsule   Type 2 diabetes mellitus  with other specified complication ([place]) - Primary Controlled DM with A1c 7.2 Hyperglycemia Complications - peripheral neuropathy, other including hyperlipidemia, GERD, depression, - increases risk of future cardiovascular complications / poor glucose control due to reduced lifestyle diet/exercise with low energy mood and fatigue  Plan:  1. Continue current therapy - Metformin 500mg  BID 2. Encourage improved lifestyle - low carb, low sugar diet, reduce portion size, continue improving regular exercise 3. Check CBG, bring log to next visit for review 4. Continue ASA, ACEi, Statin 5. DM Foot exam done today / Advised to schedule DM ophtho exam, send record 6. Follow-up 6 months    Relevant Medications   metFORMIN (GLUCOPHAGE) 500 MG tablet   lisinopril-hydrochlorothiazide (PRINZIDE,ZESTORETIC) 10-12.5 MG tablet   lovastatin (MEVACOR) 40 MG tablet   Other Relevant Orders   POCT glycosylated hemoglobin (Hb A1C) (Completed)      Meds ordered this encounter  Medications  . metFORMIN (GLUCOPHAGE) 500 MG tablet    Sig: Take 1 tablet (500 mg total) by mouth 2 (two) times daily with a meal.    Dispense:  180 tablet    Refill:  1  . DULoxetine (CYMBALTA) 30 MG capsule    Sig: Take 1 capsule (30 mg total) by mouth daily. Increase to 60mg   after 1 month    Dispense:  30 capsule    Refill:  0  . lisinopril-hydrochlorothiazide (PRINZIDE,ZESTORETIC) 10-12.5 MG tablet    Sig: Take 1 tablet by mouth daily.    Dispense:  90 tablet    Refill:  1  . carvedilol (COREG) 25 MG tablet    Sig: Take 1 tablet (25 mg total) by mouth 2 (two) times daily with a meal.    Dispense:  180 tablet    Refill:  1  . lovastatin (MEVACOR) 40 MG tablet    Sig: Take 1 tablet (40 mg total) by mouth at bedtime.    Dispense:  90 tablet    Refill:  1     Follow up plan: Return in about 6 months (around 01/01/2019) for 6 month follow-up DM A1c, Depression/Anxiety, HTN, med adjust.  Nobie Putnam, DO [facility] 07/03/2018, 11:12 AM

## 2018-07-04 LAB — POCT GLYCOSYLATED HEMOGLOBIN (HGB A1C): Hemoglobin A1C: 7.2 % — AB (ref 4.0–5.6)

## 2018-08-13 ENCOUNTER — Other Ambulatory Visit: Payer: Self-pay | Admitting: Family Medicine

## 2018-08-13 DIAGNOSIS — F331 Major depressive disorder, recurrent, moderate: Secondary | ICD-10-CM

## 2018-08-14 MED ORDER — DULOXETINE HCL 60 MG PO CPEP: 60.0000 mg | ORAL_CAPSULE | Freq: Every day | ORAL | 2 refills | Status: DC

## 2018-11-11 LAB — HM DIABETES EYE EXAM

## 2018-11-13 ENCOUNTER — Encounter: Payer: Self-pay | Admitting: Family Medicine

## 2018-11-13 DIAGNOSIS — E113299 Type 2 diabetes mellitus with mild nonproliferative diabetic retinopathy without macular edema, unspecified eye: Secondary | ICD-10-CM | POA: Insufficient documentation

## 2018-12-21 ENCOUNTER — Other Ambulatory Visit: Payer: Self-pay | Admitting: Nurse Practitioner

## 2018-12-21 DIAGNOSIS — K219 Gastro-esophageal reflux disease without esophagitis: Secondary | ICD-10-CM

## 2018-12-21 DIAGNOSIS — I2510 Atherosclerotic heart disease of native coronary artery without angina pectoris: Secondary | ICD-10-CM

## 2018-12-21 MED ORDER — PANTOPRAZOLE SODIUM 20 MG PO TBEC: 20.0000 mg | DELAYED_RELEASE_TABLET | Freq: Every day | ORAL | 1 refills | Status: DC

## 2019-01-18 ENCOUNTER — Other Ambulatory Visit: Payer: Self-pay

## 2019-01-18 ENCOUNTER — Encounter: Payer: Self-pay | Admitting: Family Medicine

## 2019-01-18 ENCOUNTER — Ambulatory Visit (INDEPENDENT_AMBULATORY_CARE_PROVIDER_SITE_OTHER): Payer: Self-pay | Admitting: Family Medicine

## 2019-01-18 ENCOUNTER — Other Ambulatory Visit: Payer: Self-pay | Admitting: Family Medicine

## 2019-01-18 VITALS — BP 130/82 | HR 103 | Temp 98.2°F | Resp 16 | Ht 69.0 in | Wt 187.0 lb

## 2019-01-18 DIAGNOSIS — E1169 Type 2 diabetes mellitus with other specified complication: Secondary | ICD-10-CM

## 2019-01-18 DIAGNOSIS — E782 Mixed hyperlipidemia: Secondary | ICD-10-CM

## 2019-01-18 DIAGNOSIS — I10 Essential (primary) hypertension: Secondary | ICD-10-CM

## 2019-01-18 DIAGNOSIS — Z125 Encounter for screening for malignant neoplasm of prostate: Secondary | ICD-10-CM

## 2019-01-18 LAB — POCT GLYCOSYLATED HEMOGLOBIN (HGB A1C): Hemoglobin A1C: 8.1 % — AB (ref 4.0–5.6)

## 2019-01-18 NOTE — Patient Instructions (Addendum)
Thank you for coming to the office today.  Recent Labs    07/04/18 2256 01/18/19 1153  HGBA1C 7.2* 8.1*   Moderately elevated blood sugar up to 8.1 now, this is 3 month average higher sugars.  Try to improve lifestyle on your own without new medication today. Reduce carb and starch intake, also reduce sugars such as Soda, try to drink more water and reduce portion size.  Stay active with regular walking exercise  Continue Metformin 500mg  twice a day, 1 in morning and 1 in evening - TRY not to miss a dose, this can help keep sugar controlled if you dont miss it. Also if NOT improving as you would expect in few weeks to month, you can DOUBLE UP dose and take TWO in morning and ONE in evening.   DUE for FASTING BLOOD WORK (no food or drink after midnight before the lab appointment, only water or coffee without cream/sugar on the morning of)  SCHEDULE "Lab Only" visit in the morning at the clinic for lab draw in 3 MONTHS   - Make sure Lab Only appointment is at about 1 week before your next appointment, so that results will be available  For Lab Results, once available within 2-3 days of blood draw, you can can log in to MyChart online to view your results and a brief explanation. Also, we can discuss results at next follow-up visit.  Please schedule a Follow-up Appointment to: Return in about 3 months (around 04/20/2019) for Yearly Checkup DM and lab results.  If you have any other questions or concerns, please feel free to call the office or send a message through [place]. You may also schedule an earlier appointment if necessary.  Additionally, you may be receiving a survey about your experience at our office within a few days to 1 week by e-mail or mail. We value your feedback.  Nobie Putnam, DO [place]

## 2019-01-18 NOTE — Assessment & Plan Note (Signed)
Mild elevated BP, manual re-check improved - Home BP readings limited Complication with known CAD    Plan:  1. Continue current BP regimen - Carvedilol 25mg  BID, Lisinoipril-HCTZ 10-12.5mg  daily 2. Encourage improved lifestyle - low sodium diet, regular exercise improve 3. May monitor BP outside office, bring readings to next visit, if persistently >140/90 or new symptoms notify office sooner - Smoking cessation  4. Follow-up 3 mo

## 2019-01-18 NOTE — Progress Notes (Signed)
Subjective:    Patient ID: Clifford Morse, male    DOB: 04-Jul-1960, 58 y.o.   MRN: [id number]  Clifford Morse is a 58 y.o. male presenting on 01/18/2019 for Diabetes   HPI   CHRONIC HTN: Reportslong history of HTN.No recent BP check. Current Meds -Carvedilol 25mg  BID, Lisinopril-HCTZ 10-12.5mg  daily Reports good compliance,occasional miss dose.Tolerating well, w/o complaints.  DM, Type 2 / Possible neuropathy complication Today due for A1c, previously 6.9 to 7.2, now concern higher sugar with diet lifestyle changes CBGs:Not checking Meds:Metformin 500 BID Currently on ACEialready Lifestyle: - Diet (admits needs to improve diabetic diet  - Exercise (no regular exercise, active with work) Admits occasional foot pain and stinging Denies hypoglycemia, polyuria, visual changes, numbness or tingling.  ChronicAnxiety / Depression recurrent currently active On duloxetine, doing well overall. No new concerns. See PHQ GAD score below   Depression screen [facility] 2/9 01/18/2019 07/03/2018 10/01/2017  Decreased Interest 1 3 0  Down, Depressed, Hopeless 0 2 1  PHQ - 2 Score 1 5 1   Altered sleeping 1 3 2   Tired, decreased energy 3 3 2   Change in appetite 0 2 1  Feeling bad or failure about yourself  0 0 0  Trouble concentrating 0 1 0  Moving slowly or fidgety/restless 0 - 0  Suicidal thoughts 0 0 0  PHQ-9 Score 5 14 6   Difficult doing work/chores Not difficult at all Somewhat difficult Not difficult at all    Social History   Tobacco Use  . Smoking status: Current Every Day Smoker    Packs/day: 1.00    Years: 15.00    Pack years: 15.00    Types: Cigarettes  . Smokeless tobacco: Current User  Substance Use Topics  . Alcohol use: No    Alcohol/week: 0.0 standard drinks  . Drug use: No    Review of Systems Per HPI unless specifically indicated above     Objective:    BP 130/82 (BP Location: Left Arm, Cuff Size: Normal)   Pulse (!) 103   Temp 98.2 F (36.8 C) (Oral)    Resp 16   Ht 5\' 9"  (1.753 m)   Wt 187 lb (84.8 kg)   BMI 27.62 kg/m   Wt Readings from Last 3 Encounters:  01/18/19 187 lb (84.8 kg)  07/03/18 192 lb (87.1 kg)  03/07/18 185 lb (83.9 kg)    Physical Exam Vitals signs and nursing note reviewed.  Constitutional:      General: He is not in acute distress.    Appearance: He is well-developed. He is not diaphoretic.     Comments: Well-appearing, comfortable, cooperative  HENT:     Head: Normocephalic and atraumatic.  Eyes:     General:        Right eye: No discharge.        Left eye: No discharge.     Conjunctiva/sclera: Conjunctivae normal.  Cardiovascular:     Rate and Rhythm: Normal rate.  Pulmonary:     Effort: Pulmonary effort is normal.  Skin:    General: Skin is warm and dry.     Findings: No erythema or rash.  Neurological:     Mental Status: He is alert and oriented to person, place, and time.  Psychiatric:        Behavior: Behavior normal.     Comments: Well groomed, good eye contact, normal speech and thoughts      Results for orders placed or performed in visit on 01/18/19  POCT HgB A1C  Result Value Ref Range   Hemoglobin A1C 8.1 (A) 4.0 - 5.6 %      Assessment & Plan:   Problem List Items Addressed This Visit    Essential hypertension    Mild elevated BP, manual re-check improved - Home BP readings limited Complication with known CAD    Plan:  1. Continue current BP regimen - Carvedilol 25mg  BID, Lisinoipril-HCTZ 10-12.5mg  daily 2. Encourage improved lifestyle - low sodium diet, regular exercise improve 3. May monitor BP outside office, bring readings to next visit, if persistently >140/90 or new symptoms notify office sooner - Smoking cessation  4. Follow-up 3 mo      Type 2 diabetes mellitus with other specified complication (HCC) - Primary    Elevated A1c to 8.1, previous 7 range. Poor diet attributed Complications - Hyperlipidemia and CAD, Retinopathy Strong fam history of DM  Plan:   1. For now focus on aggressive lifestyle Encourage improved lifestyle - low carb, low sugar diet, reduce portion size, recommend to start regular exercise - detailed discussion, handout given on diet and info - May INCREASE Metformin to 1000mg  AM and 500mg  PM 2. Continue ASA, ACEi, Statin 3. UTD DM Foot and Eye Follow-up 3 months DM with other labs      Relevant Orders   POCT HgB A1C (Completed)      No orders of the defined types were placed in this encounter.   Follow up plan: Return in about 3 months (around 04/20/2019) for Yearly Checkup DM and lab results.  Future labs ordered for 04/2019  Nobie Putnam, [facility] 01/18/2019, 11:50 AM

## 2019-01-18 NOTE — Assessment & Plan Note (Signed)
Elevated A1c to 8.1, previous 7 range. Poor diet attributed Complications - Hyperlipidemia and CAD, Retinopathy Strong fam history of DM  Plan:  1. For now focus on aggressive lifestyle Encourage improved lifestyle - low carb, low sugar diet, reduce portion size, recommend to start regular exercise - detailed discussion, handout given on diet and info - May INCREASE Metformin to 1000mg  AM and 500mg  PM 2. Continue ASA, ACEi, Statin 3. UTD DM Foot and Eye Follow-up 3 months DM with other labs

## 2019-01-29 ENCOUNTER — Other Ambulatory Visit: Payer: Self-pay | Admitting: Family Medicine

## 2019-01-29 DIAGNOSIS — F331 Major depressive disorder, recurrent, moderate: Secondary | ICD-10-CM

## 2019-02-23 ENCOUNTER — Other Ambulatory Visit: Payer: Self-pay | Admitting: Family Medicine

## 2019-02-23 DIAGNOSIS — I10 Essential (primary) hypertension: Secondary | ICD-10-CM

## 2019-03-08 ENCOUNTER — Other Ambulatory Visit: Payer: Self-pay | Admitting: Family Medicine

## 2019-03-08 DIAGNOSIS — F331 Major depressive disorder, recurrent, moderate: Secondary | ICD-10-CM

## 2019-04-16 ENCOUNTER — Other Ambulatory Visit: Payer: Self-pay | Admitting: Family Medicine

## 2019-04-16 DIAGNOSIS — I10 Essential (primary) hypertension: Secondary | ICD-10-CM

## 2019-04-16 DIAGNOSIS — I2510 Atherosclerotic heart disease of native coronary artery without angina pectoris: Secondary | ICD-10-CM

## 2019-04-19 ENCOUNTER — Ambulatory Visit: Payer: Self-pay | Admitting: Family Medicine

## 2019-04-19 ENCOUNTER — Other Ambulatory Visit: Payer: Self-pay

## 2019-04-20 LAB — CBC WITH DIFFERENTIAL/PLATELET
Absolute Monocytes: 663 cells/uL (ref 200–950)
Basophils Absolute: 102 cells/uL (ref 0–200)
Basophils Relative: 1 %
Eosinophils Absolute: 286 cells/uL (ref 15–500)
Eosinophils Relative: 2.8 %
HCT: 43.6 % (ref 38.5–50.0)
Hemoglobin: 15.2 g/dL (ref 13.2–17.1)
Lymphs Abs: 3050 cells/uL (ref 850–3900)
MCH: 31.5 pg (ref 27.0–33.0)
MCHC: 34.9 g/dL (ref 32.0–36.0)
MCV: 90.5 fL (ref 80.0–100.0)
MPV: 9.2 fL (ref 7.5–12.5)
Monocytes Relative: 6.5 %
Neutro Abs: 6100 cells/uL (ref 1500–7800)
Neutrophils Relative %: 59.8 %
Platelets: 301 10*3/uL (ref 140–400)
RBC: 4.82 10*6/uL (ref 4.20–5.80)
RDW: 12 % (ref 11.0–15.0)
Total Lymphocyte: 29.9 %
WBC: 10.2 10*3/uL (ref 3.8–10.8)

## 2019-04-20 LAB — HEMOGLOBIN A1C
Hgb A1c MFr Bld: 7.5 % of total Hgb — ABNORMAL HIGH (ref <5.7)
Mean Plasma Glucose: 169 (calc)
eAG (mmol/L): 9.3 (calc)

## 2019-04-20 LAB — COMPLETE METABOLIC PANEL WITH GFR
AG Ratio: 1.7 (calc) (ref 1.0–2.5)
ALT: 42 U/L (ref 9–46)
AST: 39 U/L — ABNORMAL HIGH (ref 10–35)
Albumin: 4.5 g/dL (ref 3.6–5.1)
Alkaline phosphatase (APISO): 72 U/L (ref 35–144)
BUN: 13 mg/dL (ref 7–25)
CO2: 27 mmol/L (ref 20–32)
Calcium: 9.9 mg/dL (ref 8.6–10.3)
Chloride: 100 mmol/L (ref 98–110)
Creat: 0.72 mg/dL (ref 0.70–1.33)
GFR, Est African American: 119 mL/min/{1.73_m2} (ref >=60)
GFR, Est Non African American: 103 mL/min/{1.73_m2} (ref >=60)
Globulin: 2.7 g/dL (calc) (ref 1.9–3.7)
Glucose, Bld: 136 mg/dL — ABNORMAL HIGH (ref 65–99)
Potassium: 5.1 mmol/L (ref 3.5–5.3)
Sodium: 137 mmol/L (ref 135–146)
Total Bilirubin: 0.6 mg/dL (ref 0.2–1.2)
Total Protein: 7.2 g/dL (ref 6.1–8.1)

## 2019-04-20 LAB — LIPID PANEL
Cholesterol: 213 mg/dL — ABNORMAL HIGH (ref <200)
HDL: 38 mg/dL — ABNORMAL LOW (ref >=40)
LDL Cholesterol (Calc): 132 mg/dL (calc) — ABNORMAL HIGH
Non-HDL Cholesterol (Calc): 175 mg/dL (calc) — ABNORMAL HIGH (ref <130)
Total CHOL/HDL Ratio: 5.6 (calc) — ABNORMAL HIGH (ref <5.0)
Triglycerides: 278 mg/dL — ABNORMAL HIGH (ref <150)

## 2019-04-20 LAB — PSA: PSA: 0.6 ng/mL (ref <=4.0)

## 2019-04-21 ENCOUNTER — Encounter: Payer: Self-pay | Admitting: Family Medicine

## 2019-04-21 ENCOUNTER — Other Ambulatory Visit: Payer: Self-pay

## 2019-04-21 ENCOUNTER — Ambulatory Visit (INDEPENDENT_AMBULATORY_CARE_PROVIDER_SITE_OTHER): Payer: Self-pay | Admitting: Family Medicine

## 2019-04-21 DIAGNOSIS — E1169 Type 2 diabetes mellitus with other specified complication: Secondary | ICD-10-CM

## 2019-04-21 MED ORDER — METFORMIN HCL 500 MG PO TABS: 1000.0000 mg | ORAL_TABLET | Freq: Two times a day (BID) | ORAL | 1 refills | Status: DC

## 2019-04-21 NOTE — Progress Notes (Signed)
Virtual Visit via Telephone The purpose of this virtual visit is to provide medical care while limiting exposure to the novel coronavirus (COVID19) for both patient and office staff.  Consent was obtained for phone visit:  Yes.   Answered questions that patient had about telehealth interaction:  Yes.   I discussed the limitations, risks, security and privacy concerns of performing an evaluation and management service by telephone. I also discussed with the patient that there may be a patient responsible charge related to this service. The patient expressed understanding and agreed to proceed.  Patient Location: Home Provider Location: [facility])  ---------------------------------------------------------------------- Chief Complaint  Patient presents with  . Diabetes    S: Reviewed CMA documentation. I have called patient and gathered additional HPI as follows:  DM, Type 2 / Possible neuropathy complication [id number] on last lab down to 7.5, had improved some. Still improving lifestyle. CBGs:Not checking Meds:Metformin 500 BID, tolerating well. Currently on ACEialready Lifestyle: - Diet (still improving low carb) - Exercise (no regular exercise, active with work now new job has been busy) History of occasional foot stinging symptoms Denies hypoglycemia, polyuria, visual changes, numbness or tingling.  Denies any high risk travel to areas of current concern for COVID19. Denies any known or suspected exposure to person with or possibly with COVID19.  Denies any fevers, chills, sweats, body ache, cough, shortness of breath, sinus pain or pressure, headache, abdominal pain, diarrhea  HM Due for Flu Shot, declines today despite counseling on benefits Due colon cancer screening - previous recommend cologuard, but he has declined due to cost, he is awaiting on receiving health insurance through work.  Past Medical History:  Diagnosis Date  . Allergy   .  Arthritis   . Asthma   . GERD (gastroesophageal reflux disease)   . Hyperlipidemia   . Myocardial infarction [facility]) 2014   Social History   Tobacco Use  . Smoking status: Current Every Day Smoker    Packs/day: 1.00    Years: 15.00    Pack years: 15.00    Types: Cigarettes  . Smokeless tobacco: Current User  Substance Use Topics  . Alcohol use: No    Alcohol/week: 0.0 standard drinks  . Drug use: No    Current Outpatient Medications:  .  aspirin 81 MG tablet, Take 81 mg by mouth daily., Disp: , Rfl:  .  carvedilol (COREG) 25 MG tablet, TAKE 1 TABLET BY MOUTH TWICE DAILY WITH A MEAL, Disp: 180 tablet, Rfl: 1 .  clopidogrel (PLAVIX) 75 MG tablet, Take 1 tablet by mouth once daily, Disp: 90 tablet, Rfl: 1 .  DULoxetine (CYMBALTA) 60 MG capsule, Take 1 capsule (60 mg total) by mouth daily., Disp: 90 capsule, Rfl: 1 .  lisinopril-hydrochlorothiazide (ZESTORETIC) 10-12.5 MG tablet, Take 1 tablet by mouth once daily, Disp: 90 tablet, Rfl: 1 .  lovastatin (MEVACOR) 40 MG tablet, Take 1 tablet (40 mg total) by mouth at bedtime., Disp: 90 tablet, Rfl: 1 .  metFORMIN (GLUCOPHAGE) 500 MG tablet, Take 2 tablets (1,000 mg total) by mouth 2 (two) times daily with a meal., Disp: 360 tablet, Rfl: 1 .  pantoprazole (PROTONIX) 20 MG tablet, Take 1 tablet (20 mg total) by mouth daily before breakfast. Stop taking Omeprazole, Disp: 90 tablet, Rfl: 1 .  Fish Oil-Cholecalciferol (FISH OIL + D3 PO), Take by mouth., Disp: , Rfl:   Depression screen [facility] 2/9 04/21/2019 01/18/2019 07/03/2018  Decreased Interest 1 1 3   Down, Depressed, Hopeless 1 0 2  PHQ - 2 Score 2 1 5   Altered sleeping 0 1 3  Tired, decreased energy 2 3 3   Change in appetite 0 0 2  Feeling bad or failure about yourself  0 0 0  Trouble concentrating 0 0 1  Moving slowly or fidgety/restless 0 0 -  Suicidal thoughts 0 0 0  PHQ-9 Score 4 5 14   Difficult doing work/chores Not difficult at all Not difficult at all Somewhat difficult    GAD  7 : Generalized Anxiety Score 01/18/2019 07/03/2018 05/28/2017 03/21/2016  Nervous, Anxious, on Edge 1 1 0 0  Control/stop worrying 0 0 0 0  Worry too much - different things 0 1 0 0  Trouble relaxing 0 2 0 0  Restless 0 0 0 1  Easily annoyed or irritable 0 2 0 0  Afraid - awful might happen 0 1 0 0  Total GAD 7 Score 1 7 0 1  Anxiety Difficulty Not difficult at all Somewhat difficult Not difficult at all Not difficult at all    -------------------------------------------------------------------------- O: No physical exam performed due to remote telephone encounter.  Lab results reviewed.  Recent Results (from the past 2160 hour(s))  PSA     Status: None   Collection Time: 04/19/19  9:12 AM  Result Value Ref Range   PSA 0.6 < OR = 4.0 ng/mL    Comment: The total PSA value from this assay system is  standardized against the WHO standard. The test  result will be approximately 20% lower when compared  to the equimolar-standardized total PSA (Beckman  Coulter). Comparison of serial PSA results should be  interpreted with this fact in mind. . This test was performed using the Siemens  chemiluminescent method. Values obtained from  different assay methods cannot be used interchangeably. PSA levels, regardless of value, should not be interpreted as absolute evidence of the presence or absence of disease.   CBC with Differential/Platelet     Status: None   Collection Time: 04/19/19  9:12 AM  Result Value Ref Range   WBC 10.2 3.8 - 10.8 Thousand/uL   RBC 4.82 4.20 - 5.80 Million/uL   Hemoglobin 15.2 13.2 - 17.1 g/dL   HCT 43.6 38.5 - 50.0 %   MCV 90.5 80.0 - 100.0 fL   MCH 31.5 27.0 - 33.0 pg   MCHC 34.9 32.0 - 36.0 g/dL   RDW 12.0 11.0 - 15.0 %   Platelets 301 140 - 400 Thousand/uL   MPV 9.2 7.5 - 12.5 fL   Neutro Abs 6,100 1,500 - 7,800 cells/uL   Lymphs Abs 3,050 850 - 3,900 cells/uL   Absolute Monocytes 663 200 - 950 cells/uL   Eosinophils Absolute 286 15 - 500 cells/uL    Basophils Absolute 102 0 - 200 cells/uL   Neutrophils Relative % 59.8 %   Total Lymphocyte 29.9 %   Monocytes Relative 6.5 %   Eosinophils Relative 2.8 %   Basophils Relative 1.0 %  Lipid panel     Status: Abnormal   Collection Time: 04/19/19  9:12 AM  Result Value Ref Range   Cholesterol 213 (H) <200 mg/dL   HDL 38 (L) > OR = 40 mg/dL   Triglycerides 278 (H) <150 mg/dL    Comment: . If a non-fasting specimen was collected, consider repeat triglyceride testing on a fasting specimen if clinically indicated.  Yates Decamp et al. J. of Clin. Lipidol. [id number]. Marland Kitchen    LDL Cholesterol (Calc) 132 (H) mg/dL (calc)    Comment:  Reference range: <100 . Desirable range <100 mg/dL for primary prevention;   <70 mg/dL for patients with CHD or diabetic patients  with > or = 2 CHD risk factors. Marland Kitchen LDL-C is now calculated using the Martin-Hopkins  calculation, which is a validated novel method providing  better accuracy than the Friedewald equation in the  estimation of LDL-C.  Cresenciano Genre et al. Annamaria Helling. [id number]): 2061-2068  (http://education.[facility].com/faq/FAQ164)    Total CHOL/HDL Ratio 5.6 (H) <5.0 (calc)   Non-HDL Cholesterol (Calc) 175 (H) <130 mg/dL (calc)    Comment: For patients with diabetes plus 1 major ASCVD risk  factor, treating to a non-HDL-C goal of <100 mg/dL  (LDL-C of <70 mg/dL) is considered a therapeutic  option.   COMPLETE METABOLIC PANEL WITH GFR     Status: Abnormal   Collection Time: 04/19/19  9:12 AM  Result Value Ref Range   Glucose, Bld 136 (H) 65 - 99 mg/dL    Comment: .            Fasting reference interval . For someone without known diabetes, a glucose value >125 mg/dL indicates that they may have diabetes and this should be confirmed with a follow-up test. .    BUN 13 7 - 25 mg/dL   Creat 0.72 0.70 - 1.33 mg/dL    Comment: For patients >25 years of age, the reference limit for Creatinine is approximately 13% higher for people identified  as African-American. .    GFR, Est Non African American 103 > OR = 60 mL/min/1.46m2   GFR, Est African American 119 > OR = 60 mL/min/1.78m2   BUN/Creatinine Ratio NOT APPLICABLE 6 - 22 (calc)   Sodium 137 135 - 146 mmol/L   Potassium 5.1 3.5 - 5.3 mmol/L   Chloride 100 98 - 110 mmol/L   CO2 27 20 - 32 mmol/L   Calcium 9.9 8.6 - 10.3 mg/dL   Total Protein 7.2 6.1 - 8.1 g/dL   Albumin 4.5 3.6 - 5.1 g/dL   Globulin 2.7 1.9 - 3.7 g/dL (calc)   AG Ratio 1.7 1.0 - 2.5 (calc)   Total Bilirubin 0.6 0.2 - 1.2 mg/dL   Alkaline phosphatase (APISO) 72 35 - 144 U/L   AST 39 (H) 10 - 35 U/L   ALT 42 9 - 46 U/L  Hemoglobin A1c     Status: Abnormal   Collection Time: 04/19/19  9:12 AM  Result Value Ref Range   Hgb A1c MFr Bld 7.5 (H) <5.7 % of total Hgb    Comment: For someone without known diabetes, a hemoglobin A1c value of 6.5% or greater indicates that they may have  diabetes and this should be confirmed with a follow-up  test. . For someone with known diabetes, a value <7% indicates  that their diabetes is well controlled and a value  greater than or equal to 7% indicates suboptimal  control. A1c targets should be individualized based on  duration of diabetes, age, comorbid conditions, and  other considerations. . Currently, no consensus exists regarding use of hemoglobin A1c for diagnosis of diabetes for children. .    Mean Plasma Glucose 169 (calc)   eAG (mmol/L) 9.3 (calc)    -------------------------------------------------------------------------- A&P:  Problem List Items Addressed This Visit    Type 2 diabetes mellitus with other specified complication ([place]) - Primary    Improved A1c down to 7.5 Complications - Hyperlipidemia and CAD, Retinopathy Strong fam history of DM  Plan:  1. Increase  Metformin to 1000mg   BID - can start with x 2 AM and x 1 PM dosing if prefer 2. Continue ASA, ACEi, Statin 3. UTD DM Foot and Eye F/u 6 months DM A1c      Relevant Medications    metFORMIN (GLUCOPHAGE) 500 MG tablet     Updated Health Maintenance information Reviewed recent lab results with patient Encouraged improvement to lifestyle with diet and exercise - Goal of weight loss   Meds ordered this encounter  Medications  . DISCONTD: metFORMIN (GLUCOPHAGE) 500 MG tablet    Sig: Take 2 tablets (1,000 mg total) by mouth 2 (two) times daily with a meal.    Dispense:  180 tablet    Refill:  1  . metFORMIN (GLUCOPHAGE) 500 MG tablet    Sig: Take 2 tablets (1,000 mg total) by mouth 2 (two) times daily with a meal.    Dispense:  360 tablet    Refill:  1    Follow-up: - Return in 6 months for DM A1c  Patient verbalizes understanding with the above medical recommendations including the limitation of remote medical advice.  Specific follow-up and call-back criteria were given for patient to follow-up or seek medical care more urgently if needed.   - Time spent in direct consultation with patient on phone: 9 minutes   Nobie Putnam, [facility] 04/21/2019, 8:47 AM

## 2019-04-21 NOTE — Patient Instructions (Addendum)
Recent Labs    07/04/18 2256 01/18/19 1153 04/19/19 0912  HGBA1C 7.2* 8.1* 7.5*    Still goal to improve sugar below 7.0  Increase Metformin 500mg  - now instead of 1 pill twice a day, should go to TWO pills for one of the doses and other can be one pill, then eventually take TWO pills twice a day. Call if you need refill or higher dose.  It should not immediately drop sugar, it is a gradual medicine. It is safe and will not lower sugar too suddenly.  Keep improving lifestyle. Some elevated cholesterol still.   Please schedule a Follow-up Appointment to: Return in about 6 months (around 10/20/2019) for Follow-up 6 months DM A1c.  If you have any other questions or concerns, please feel free to call the office or send a message through [place]. You may also schedule an earlier appointment if necessary.  Additionally, you may be receiving a survey about your experience at our office within a few days to 1 week by e-mail or mail. We value your feedback.  Nobie Putnam, DO [place]

## 2019-04-21 NOTE — Assessment & Plan Note (Signed)
Improved A1c down to 7.5 Complications - Hyperlipidemia and CAD, Retinopathy Strong fam history of DM  Plan:  1. Increase  Metformin to 1000mg  BID - can start with x 2 AM and x 1 PM dosing if prefer 2. Continue ASA, ACEi, Statin 3. UTD DM Foot and Eye F/u 6 months DM A1c

## 2019-06-21 ENCOUNTER — Other Ambulatory Visit: Payer: Self-pay | Admitting: Family Medicine

## 2019-06-21 DIAGNOSIS — I2510 Atherosclerotic heart disease of native coronary artery without angina pectoris: Secondary | ICD-10-CM

## 2019-09-18 ENCOUNTER — Other Ambulatory Visit: Payer: Self-pay | Admitting: Family Medicine

## 2019-09-18 DIAGNOSIS — F331 Major depressive disorder, recurrent, moderate: Secondary | ICD-10-CM

## 2019-12-28 ENCOUNTER — Encounter: Payer: Self-pay | Admitting: Family Medicine

## 2019-12-28 ENCOUNTER — Ambulatory Visit (INDEPENDENT_AMBULATORY_CARE_PROVIDER_SITE_OTHER): Payer: Self-pay | Admitting: Family Medicine

## 2019-12-28 ENCOUNTER — Other Ambulatory Visit: Payer: Self-pay

## 2019-12-28 DIAGNOSIS — J0110 Acute frontal sinusitis, unspecified: Secondary | ICD-10-CM

## 2019-12-28 MED ORDER — IPRATROPIUM BROMIDE 0.06 % NA SOLN: 2.0000 | Freq: Four times a day (QID) | NASAL | 0 refills | Status: DC

## 2019-12-28 NOTE — Patient Instructions (Addendum)
Start Atrovent nasal spray decongestant 2 sprays in each nostril up to 4 times daily for 7 days  Keep on Tylenol-Sinus  If not improved (worse sinus pain or pressure, fever chills, thicker sinus drainage, headache) you can message me on mychart 48 72 hours and we can offer antibiotic / prednisone if needed.  Please schedule a Follow-up Appointment to: Return in about 3 days (around 12/31/2019), or if symptoms worsen or fail to improve, for sinusitis.  If you have any other questions or concerns, please feel free to call the office or send a message through [place]. You may also schedule an earlier appointment if necessary.  Additionally, you may be receiving a survey about your experience at our office within a few days to 1 week by e-mail or mail. We value your feedback.  Nobie Putnam, DO [place]

## 2019-12-28 NOTE — Progress Notes (Signed)
Virtual Visit via Telephone The purpose of this virtual visit is to provide medical care while limiting exposure to the novel coronavirus (COVID19) for both patient and office staff.  Consent was obtained for phone visit:  Yes.   Answered questions that patient had about telehealth interaction:  Yes.   I discussed the limitations, risks, security and privacy concerns of performing an evaluation and management service by telephone. I also discussed with the patient that there may be a patient responsible charge related to this service. The patient expressed understanding and agreed to proceed.  Patient Location: Home Provider Location: [facility])  ---------------------------------------------------------------------- Chief Complaint  Patient presents with  . Sinusitis    onset 3 days --nasal congestion --swollen eyes-HA denies fever/chills/bodyache    S: Reviewed CMA documentation. I have called patient and gathered additional HPI as follows:  SINUSITIS Reports that symptoms started started about 3+ days ago with sinus pain and swelling with congestion and eye swelling, worse with "crackling or fluid in ears". With persistent nasal drainage and admits mild blood tinged drainage when blows nose. Seems to describe recurrent symptoms - will have similar issues few weeks ago and can recur without. - Tried OTC Tylenol-Sinus decongestant, temporary relief  Denies any known or suspected exposure to person with or possibly with COVID19.  Denies any fevers, chills, sweats, body ache, cough, shortness of breath, abdominal pain, diarrhea  Past Medical History:  Diagnosis Date  . Allergy   . Arthritis   . Asthma   . GERD (gastroesophageal reflux disease)   . Hyperlipidemia   . Myocardial infarction [facility]) 2014   Social History   Tobacco Use  . Smoking status: Current Every Day Smoker    Packs/day: 1.00    Years: 15.00    Pack years: 15.00    Types: Cigarettes   . Smokeless tobacco: Current User  Substance Use Topics  . Alcohol use: No    Alcohol/week: 0.0 standard drinks  . Drug use: No    Current Outpatient Medications:  .  aspirin 81 MG tablet, Take 81 mg by mouth daily., Disp: , Rfl:  .  carvedilol (COREG) 25 MG tablet, TAKE 1 TABLET BY MOUTH TWICE DAILY WITH A MEAL, Disp: 180 tablet, Rfl: 1 .  clopidogrel (PLAVIX) 75 MG tablet, Take 1 tablet by mouth once daily, Disp: 90 tablet, Rfl: 3 .  DULoxetine (CYMBALTA) 60 MG capsule, Take 1 capsule by mouth once daily, Disp: 90 capsule, Rfl: 1 .  Fish Oil-Cholecalciferol (FISH OIL + D3 PO), Take by mouth., Disp: , Rfl:  .  lisinopril-hydrochlorothiazide (ZESTORETIC) 10-12.5 MG tablet, Take 1 tablet by mouth once daily, Disp: 90 tablet, Rfl: 1 .  lovastatin (MEVACOR) 40 MG tablet, Take 1 tablet (40 mg total) by mouth at bedtime., Disp: 90 tablet, Rfl: 1 .  metFORMIN (GLUCOPHAGE) 500 MG tablet, Take 2 tablets (1,000 mg total) by mouth 2 (two) times daily with a meal., Disp: 360 tablet, Rfl: 1 .  pantoprazole (PROTONIX) 20 MG tablet, Take 1 tablet (20 mg total) by mouth daily before breakfast. Stop taking Omeprazole, Disp: 90 tablet, Rfl: 1 .  ipratropium (ATROVENT) 0.06 % nasal spray, Place 2 sprays into both nostrils 4 (four) times daily. For up to 5-7 days then stop., Disp: 15 mL, Rfl: 0  Depression screen [facility] 2/9 04/21/2019 01/18/2019 07/03/2018  Decreased Interest 1 1 3   Down, Depressed, Hopeless 1 0 2  PHQ - 2 Score 2 1 5   Altered sleeping 0 1 3  Tired, decreased energy 2 3 3   Change in appetite 0 0 2  Feeling bad or failure about yourself  0 0 0  Trouble concentrating 0 0 1  Moving slowly or fidgety/restless 0 0 -  Suicidal thoughts 0 0 0  PHQ-9 Score 4 5 14   Difficult doing work/chores Not difficult at all Not difficult at all Somewhat difficult    GAD 7 : Generalized Anxiety Score 01/18/2019 07/03/2018 05/28/2017 03/21/2016  Nervous, Anxious, on Edge 1 1 0 0  Control/stop worrying 0 0 0 0   Worry too much - different things 0 1 0 0  Trouble relaxing 0 2 0 0  Restless 0 0 0 1  Easily annoyed or irritable 0 2 0 0  Afraid - awful might happen 0 1 0 0  Total GAD 7 Score 1 7 0 1  Anxiety Difficulty Not difficult at all Somewhat difficult Not difficult at all Not difficult at all    -------------------------------------------------------------------------- O: No physical exam performed due to remote telephone encounter.  Lab results reviewed.  No results found for this or any previous visit (from the past 2160 hour(s)).  -------------------------------------------------------------------------- A&P:  Problem List Items Addressed This Visit    None    Visit Diagnoses    Acute non-recurrent frontal sinusitis    -  Primary   Relevant Medications   ipratropium (ATROVENT) 0.06 % nasal spray     Consistent with acute frontal rhinosinusitis, likely initially viral URI vs allergic rhinitis component without evidence of acute bacterial infection. Seems seasonal / weather triggered recurrence  Plan: 1. Reassurance, likely self-limited - no indication for antibiotics at this time 2. Start Atrovent nasal spray decongestant 2 sprays in each nostril up to 4 times daily for 7 days - Continue Tylenol-Sinus OTC 3. After improved - may add Loratadine (Claritin) 10mg  daily and Flonase 2 sprays in each nostril daily for next 4-6 weeks, then may stop and use seasonally or as needed 4. Return criteria reviewed  Message in 48-72 hr if not improved, f/u may offer antibiotic / prednisone, see AVS   Meds ordered this encounter  Medications  . ipratropium (ATROVENT) 0.06 % nasal spray    Sig: Place 2 sprays into both nostrils 4 (four) times daily. For up to 5-7 days then stop.    Dispense:  15 mL    Refill:  0    Follow-up: - Return in 2-3 days if not improved, mychart message  Patient will need to return for in person office visit for diabetes.  Patient verbalizes understanding  with the above medical recommendations including the limitation of remote medical advice.  Specific follow-up and call-back criteria were given for patient to follow-up or seek medical care more urgently if needed.   - Time spent in direct consultation with patient on phone: 7 minutes   Nobie Putnam, [facility] 12/28/2019, 2:44 PM

## 2020-01-10 DIAGNOSIS — J0110 Acute frontal sinusitis, unspecified: Secondary | ICD-10-CM

## 2020-01-11 MED ORDER — AMOXICILLIN-POT CLAVULANATE 875-125 MG PO TABS: 1.0000 | ORAL_TABLET | Freq: Two times a day (BID) | ORAL | 0 refills | Status: DC

## 2020-01-11 NOTE — Addendum Note (Signed)
Addended by: Olin Hauser on: 01/11/2020 05:45 PM   Modules accepted: Orders

## 2020-02-09 ENCOUNTER — Other Ambulatory Visit: Payer: Self-pay | Admitting: Family Medicine

## 2020-02-09 DIAGNOSIS — I2510 Atherosclerotic heart disease of native coronary artery without angina pectoris: Secondary | ICD-10-CM

## 2020-02-09 DIAGNOSIS — I10 Essential (primary) hypertension: Secondary | ICD-10-CM

## 2020-02-14 ENCOUNTER — Ambulatory Visit (INDEPENDENT_AMBULATORY_CARE_PROVIDER_SITE_OTHER): Payer: 59 | Admitting: Family Medicine

## 2020-02-14 ENCOUNTER — Encounter: Payer: Self-pay | Admitting: Family Medicine

## 2020-02-14 VITALS — BP 102/58 | HR 99 | Temp 97.5°F | Resp 16 | Ht 69.0 in | Wt 179.0 lb

## 2020-02-14 DIAGNOSIS — E113299 Type 2 diabetes mellitus with mild nonproliferative diabetic retinopathy without macular edema, unspecified eye: Secondary | ICD-10-CM | POA: Diagnosis not present

## 2020-02-14 DIAGNOSIS — F331 Major depressive disorder, recurrent, moderate: Secondary | ICD-10-CM

## 2020-02-14 DIAGNOSIS — F419 Anxiety disorder, unspecified: Secondary | ICD-10-CM | POA: Diagnosis not present

## 2020-02-14 DIAGNOSIS — E1169 Type 2 diabetes mellitus with other specified complication: Secondary | ICD-10-CM

## 2020-02-14 DIAGNOSIS — Z1211 Encounter for screening for malignant neoplasm of colon: Secondary | ICD-10-CM

## 2020-02-14 DIAGNOSIS — E785 Hyperlipidemia, unspecified: Secondary | ICD-10-CM

## 2020-02-14 DIAGNOSIS — F5104 Psychophysiologic insomnia: Secondary | ICD-10-CM | POA: Insufficient documentation

## 2020-02-14 MED ORDER — LOVASTATIN 40 MG PO TABS: 40.0000 mg | ORAL_TABLET | Freq: Every day | ORAL | 3 refills | Status: DC

## 2020-02-14 MED ORDER — TRAZODONE HCL 50 MG PO TABS: 50.0000 mg | ORAL_TABLET | Freq: Every day | ORAL | 2 refills | Status: DC

## 2020-02-14 NOTE — Progress Notes (Signed)
Subjective:    Patient ID: Clifford Morse, male    DOB: 1961/03/12, 59 y.o.   MRN: [id number]  Clifford Morse is a 59 y.o. male presenting on 02/14/2020 for Depression and Diabetes  Rescheduled annual physical, needs labs in October 2021  HPI    Depression, major, chronic recurrent moderate Reports chronic issue Recent worsening, job shut down in July 2021 -  decreased interest or pleasure in doing things. Reduced motivation On Duloxetine 30mg  for years then increase dose to 60mg  in past 1-2 years. Now limited benefit - sleep can be mixed, with insomnia wakes up every 1-2 hours, poor sleep Failed wellbutrin in past  DM, Type 2 / Possible neuropathy complication Prior H4R 7.5 last year CBGs:Not checking Meds:Metformin 1000 BID, tolerating well. Currently on ACEialready Lifestyle: - Diet (still improving low carb) - Exercise (no regular exercise History of occasional foot stinging symptoms Denies hypoglycemia, polyuria, visual changes, numbness or tingling.  Health Maintenance: No updated COVID19 vaccine.  Depression screen [facility] 2/9 02/14/2020 04/21/2019 01/18/2019  Decreased Interest 3 1 1   Down, Depressed, Hopeless 2 1 0  PHQ - 2 Score 5 2 1   Altered sleeping 1 0 1  Tired, decreased energy 3 2 3   Change in appetite 1 0 0  Feeling bad or failure about yourself  1 0 0  Trouble concentrating 1 0 0  Moving slowly or fidgety/restless 0 0 0  Suicidal thoughts 1 0 0  PHQ-9 Score 13 4 5   Difficult doing work/chores Somewhat difficult Not difficult at all Not difficult at all  Some recent data might be hidden   GAD 7 : Generalized Anxiety Score 01/18/2019 07/03/2018 05/28/2017 03/21/2016  Nervous, Anxious, on Edge 1 1 0 0  Control/stop worrying 0 0 0 0  Worry too much - different things 0 1 0 0  Trouble relaxing 0 2 0 0  Restless 0 0 0 1  Easily annoyed or irritable 0 2 0 0  Afraid - awful might happen 0 1 0 0  Total GAD 7 Score 1 7 0 1  Anxiety Difficulty Not difficult at  all Somewhat difficult Not difficult at all Not difficult at all     Past Medical History:  Diagnosis Date  . Allergy   . Arthritis   . Asthma   . GERD (gastroesophageal reflux disease)   . Hyperlipidemia   . Myocardial infarction [facility]) 2014   History reviewed. No pertinent surgical history. Social History   Socioeconomic History  . Marital status: Married    Spouse name: Not on file  . Number of children: Not on file  . Years of education: Not on file  . Highest education level: Not on file  Occupational History  . Not on file  Tobacco Use  . Smoking status: Current Every Day Smoker    Packs/day: 1.00    Years: 15.00    Pack years: 15.00    Types: Cigarettes  . Smokeless tobacco: Current User  Substance and Sexual Activity  . Alcohol use: No    Alcohol/week: 0.0 standard drinks  . Drug use: No  . Sexual activity: Not on file  Other Topics Concern  . Not on file  Social History Narrative  . Not on file   Social Determinants of Health   Financial Resource Strain:   . Difficulty of Paying Living Expenses:   Food Insecurity:   . Worried About Charity fundraiser in the Last Year:   . [employer] of  Food in the Last Year:   Transportation Needs:   . Film/video editor (Medical):   Marland Kitchen Lack of Transportation (Non-Medical):   Physical Activity:   . Days of Exercise per Week:   . Minutes of Exercise per Session:   Stress:   . Feeling of Stress :   Social Connections:   . Frequency of Communication with Friends and Family:   . Frequency of Social Gatherings with Friends and Family:   . Attends Religious Services:   . Active Member of Clubs or Organizations:   . Attends Archivist Meetings:   Marland Kitchen Marital Status:   Intimate Partner Violence:   . Fear of Current or Ex-Partner:   . Emotionally Abused:   Marland Kitchen Physically Abused:   . Sexually Abused:    Family History  Problem Relation Age of Onset  . Emphysema Father   . Diabetes Mother    Current  Outpatient Medications on File Prior to Visit  Medication Sig  . aspirin 81 MG tablet Take 81 mg by mouth daily.  . carvedilol (COREG) 25 MG tablet TAKE 1 TABLET BY MOUTH TWICE DAILY WITH A MEAL  . clopidogrel (PLAVIX) 75 MG tablet Take 1 tablet by mouth once daily  . DULoxetine (CYMBALTA) 60 MG capsule Take 1 capsule by mouth once daily  . ipratropium (ATROVENT) 0.06 % nasal spray Place 2 sprays into both nostrils 4 (four) times daily. For up to 5-7 days then stop.  Marland Kitchen lisinopril-hydrochlorothiazide (ZESTORETIC) 10-12.5 MG tablet Take 1 tablet by mouth once daily  . metFORMIN (GLUCOPHAGE) 500 MG tablet Take 2 tablets (1,000 mg total) by mouth 2 (two) times daily with a meal.  . pantoprazole (PROTONIX) 20 MG tablet Take 1 tablet (20 mg total) by mouth daily before breakfast. Stop taking Omeprazole   No current facility-administered medications on file prior to visit.    Review of Systems Per HPI unless specifically indicated above     Objective:    BP (!) 102/58   Pulse 99   Temp (!) 97.5 F (36.4 C) (Temporal)   Resp 16   Ht 5\' 9"  (1.753 m)   Wt 179 lb (81.2 kg)   SpO2 97%   BMI 26.43 kg/m   Wt Readings from Last 3 Encounters:  02/14/20 179 lb (81.2 kg)  01/18/19 187 lb (84.8 kg)  07/03/18 192 lb (87.1 kg)    Physical Exam Vitals and nursing note reviewed.  Constitutional:      General: He is not in acute distress.    Appearance: He is well-developed. He is not diaphoretic.     Comments: Well-appearing, comfortable, cooperative  HENT:     Head: Normocephalic and atraumatic.  Eyes:     General:        Right eye: No discharge.        Left eye: No discharge.     Conjunctiva/sclera: Conjunctivae normal.  Neck:     Thyroid: No thyromegaly.  Cardiovascular:     Rate and Rhythm: Normal rate and regular rhythm.     Heart sounds: Normal heart sounds. No murmur heard.   Pulmonary:     Effort: Pulmonary effort is normal. No respiratory distress.     Breath sounds: Normal  breath sounds. No wheezing or rales.  Musculoskeletal:        General: Normal range of motion.     Cervical back: Normal range of motion and neck supple.  Lymphadenopathy:     Cervical: No cervical adenopathy.  Skin:  General: Skin is warm and dry.     Findings: No erythema or rash.  Neurological:     Mental Status: He is alert and oriented to person, place, and time.  Psychiatric:        Behavior: Behavior normal.     Comments: Well groomed, good eye contact, normal speech and thoughts      Diabetic Foot Exam - Simple   Simple Foot Form Diabetic Foot exam was performed with the following findings: Yes 02/14/2020  5:16 PM  Visual Inspection No deformities, no ulcerations, no other skin breakdown bilaterally: Yes Sensation Testing Intact to touch and monofilament testing bilaterally: Yes Pulse Check Posterior Tibialis and Dorsalis pulse intact bilaterally: Yes Comments     Results for orders placed or performed in visit on 04/19/19  PSA  Result Value Ref Range   PSA 0.6 < OR = 4.0 ng/mL  CBC with Differential/Platelet  Result Value Ref Range   WBC 10.2 3.8 - 10.8 Thousand/uL   RBC 4.82 4.20 - 5.80 Million/uL   Hemoglobin 15.2 13.2 - 17.1 g/dL   HCT 43.6 38 - 50 %   MCV 90.5 80.0 - 100.0 fL   MCH 31.5 27.0 - 33.0 pg   MCHC 34.9 32.0 - 36.0 g/dL   RDW 12.0 11.0 - 15.0 %   Platelets 301 140 - 400 Thousand/uL   MPV 9.2 7.5 - 12.5 fL   Neutro Abs 6,100 1,500 - 7,800 cells/uL   Lymphs Abs 3,050 850 - 3,900 cells/uL   Absolute Monocytes 663 200 - 950 cells/uL   Eosinophils Absolute 286 15 - 500 cells/uL   Basophils Absolute 102 0 - 200 cells/uL   Neutrophils Relative % 59.8 %   Total Lymphocyte 29.9 %   Monocytes Relative 6.5 %   Eosinophils Relative 2.8 %   Basophils Relative 1.0 %  Lipid panel  Result Value Ref Range   Cholesterol 213 (H) <200 mg/dL   HDL 38 (L) > OR = 40 mg/dL   Triglycerides 278 (H) <150 mg/dL   LDL Cholesterol (Calc) 132 (H) mg/dL (calc)    Total CHOL/HDL Ratio 5.6 (H) <5.0 (calc)   Non-HDL Cholesterol (Calc) 175 (H) <130 mg/dL (calc)  COMPLETE METABOLIC PANEL WITH GFR  Result Value Ref Range   Glucose, Bld 136 (H) 65 - 99 mg/dL   BUN 13 7 - 25 mg/dL   Creat 0.72 0.70 - 1.33 mg/dL   GFR, Est Non African American 103 > OR = 60 mL/min/1.16m2   GFR, Est African American 119 > OR = 60 mL/min/1.39m2   BUN/Creatinine Ratio NOT APPLICABLE 6 - 22 (calc)   Sodium 137 135 - 146 mmol/L   Potassium 5.1 3.5 - 5.3 mmol/L   Chloride 100 98 - 110 mmol/L   CO2 27 20 - 32 mmol/L   Calcium 9.9 8.6 - 10.3 mg/dL   Total Protein 7.2 6.1 - 8.1 g/dL   Albumin 4.5 3.6 - 5.1 g/dL   Globulin 2.7 1.9 - 3.7 g/dL (calc)   AG Ratio 1.7 1.0 - 2.5 (calc)   Total Bilirubin 0.6 0.2 - 1.2 mg/dL   Alkaline phosphatase (APISO) 72 35 - 144 U/L   AST 39 (H) 10 - 35 U/L   ALT 42 9 - 46 U/L  Hemoglobin A1c  Result Value Ref Range   Hgb A1c MFr Bld 7.5 (H) <5.7 % of total Hgb   Mean Plasma Glucose 169 (calc)   eAG (mmol/L) 9.3 (calc)  Assessment & Plan:   Problem List Items Addressed This Visit    Type 2 diabetes mellitus with other specified complication ([place])    Improved A1c down to 7.5 Complications - Hyperlipidemia and CAD, Retinopathy Strong fam history of DM  Plan:  1. Metformin to 1000mg  BID 2. Continue ASA, ACEi, Statin DM Foot exam today F/u 2 months labs A1c      Relevant Medications   lovastatin (MEVACOR) 40 MG tablet   Psychophysiological insomnia   Relevant Medications   traZODone (DESYREL) 50 MG tablet   Major depressive disorder, recurrent, moderate (HCC) - Primary    Currently flared with abnormal PHQ recent worse depression Secondary anxiety, insomnia Failed Wellbutrin, Fluoxetine. Off Xanax  On duloxetine 60mg  Will add Trazodone 50mg  nightly, titrate up to 75 to 100mg  in future Follow-up , may refer to therapist in future if insufficient improvement       Relevant Medications   traZODone (DESYREL) 50 MG tablet    Hyperlipidemia associated with type 2 diabetes mellitus ([place])    Previously controlled cholesterol on statin and lifestyle Due for lab in 03/2020 ASCVD with known CAD  Plan: 1. RESTART Lovastatin - may take in daytime, forgetting at night 2. Continue DAPT Plavix + ASA 81mg  for secondary ASCVD risk reduction 3. Encourage improved lifestyle - low carb/cholesterol, reduce portion size, continue improving regular exercise 4. Follow-up w/ cardiology      Relevant Medications   lovastatin (MEVACOR) 40 MG tablet   Background diabetic retinopathy associated with type 2 diabetes mellitus ([place])    DM Retinopathy Will return for next DM Eye exam      Relevant Medications   lovastatin (MEVACOR) 40 MG tablet   Anxiety   Relevant Medications   traZODone (DESYREL) 50 MG tablet    Other Visit Diagnoses    Screen for colon cancer       Relevant Orders   Cologuard       Due for routine colon cancer screening - Discussion today about recommendations for either Colonoscopy or Cologuard screening, benefits and risks of screening, interested in Cologuard, understands that if positive then recommendation is for diagnostic colonoscopy to follow-up. - Ordered Cologuard today   Meds ordered this encounter  Medications  . traZODone (DESYREL) 50 MG tablet    Sig: Take 1 tablet (50 mg total) by mouth at bedtime. May increase to one and half or two pills in future after 3-4 weeks if needed    Dispense:  30 tablet    Refill:  2  . lovastatin (MEVACOR) 40 MG tablet    Sig: Take 1 tablet (40 mg total) by mouth daily.    Dispense:  90 tablet    Refill:  3      Follow up plan: Return in about 2 months (around 04/15/2020) for Annual Physical.  Future labs ordered 03/2020  Nobie Putnam, DO [facility] 02/14/2020, 5:18 PM

## 2020-02-14 NOTE — Patient Instructions (Addendum)
Thank you for coming to the office today.  Switch cholesterol pill lovastatin to morning  ADD new mood/depression medicine to help insomnia or sleep - Trazodone 50mg  take one nightly about 30 min prior to bed. May gradually increase over 4 weeks, if decide to take one and half or eventually two pills for 100mg  dose. May take a a few weeks to determine full effect.  Keep on Duloxetine.  Follow up sooner if needed, we can refer to Therapist or other provider if need.  ----------------------------  If want COVID vaccine, can go to [facility] or [facility] to schedule  Colon Cancer Screening: - For all adults age 46+ routine colon cancer screening is highly recommended.     - Recent guidelines from [place] recommend starting age of 22 - Early detection of colon cancer is important, because often there are no warning signs or symptoms, also if found early usually it can be cured. Late stage is hard to treat.  - If you are not interested in Colonoscopy screening (if done and normal you could be cleared for 5 to 10 years until next due), then Cologuard is an excellent alternative for screening test for Colon Cancer. It is highly sensitive for detecting DNA of colon cancer from even the earliest stages. Also, there is NO bowel prep required. - If Cologuard is NEGATIVE, then it is good for 3 years before next due - If Cologuard is POSITIVE, then it is strongly advised to get a Colonoscopy, which allows the GI doctor to locate the source of the cancer or polyp (even very early stage) and treat it by removing it. ------------------------- ORDERED COLOGUARD   Follow instructions to collect sample, you may call the company for any help or questions, 24/7 telephone support at [phone].   DUE for FASTING BLOOD WORK (no food or drink after midnight before the lab appointment, only water or coffee without cream/sugar on the morning of)  SCHEDULE "Lab Only" visit in the morning at the  clinic for lab draw in 2 MONTHS   - Make sure Lab Only appointment is at about 1 week before your next appointment, so that results will be available  For Lab Results, once available within 2-3 days of blood draw, you can can log in to MyChart online to view your results and a brief explanation. Also, we can discuss results at next follow-up visit.   Please schedule a Follow-up Appointment to: Return in about 2 months (around 04/15/2020) for Annual Physical.  If you have any other questions or concerns, please feel free to call the office or send a message through [facility]. You may also schedule an earlier appointment if necessary.  Additionally, you may be receiving a survey about your experience at our office within a few days to 1 week by e-mail or mail. We value your feedback.  Nobie Putnam, DO [facility]

## 2020-02-15 ENCOUNTER — Encounter: Payer: Self-pay | Admitting: Family Medicine

## 2020-02-15 ENCOUNTER — Other Ambulatory Visit: Payer: Self-pay | Admitting: Family Medicine

## 2020-02-15 DIAGNOSIS — Z125 Encounter for screening for malignant neoplasm of prostate: Secondary | ICD-10-CM

## 2020-02-15 DIAGNOSIS — Z0000 Encounter for general adult medical examination without abnormal findings: Secondary | ICD-10-CM

## 2020-02-15 DIAGNOSIS — E1169 Type 2 diabetes mellitus with other specified complication: Secondary | ICD-10-CM

## 2020-02-15 DIAGNOSIS — E785 Hyperlipidemia, unspecified: Secondary | ICD-10-CM

## 2020-02-15 DIAGNOSIS — F331 Major depressive disorder, recurrent, moderate: Secondary | ICD-10-CM

## 2020-02-15 NOTE — Assessment & Plan Note (Signed)
DM Retinopathy Will return for next DM Eye exam

## 2020-02-15 NOTE — Assessment & Plan Note (Signed)
Improved A1c down to 7.5 Complications - Hyperlipidemia and CAD, Retinopathy Strong fam history of DM  Plan:  1. Metformin to 1000mg  BID 2. Continue ASA, ACEi, Statin DM Foot exam today F/u 2 months labs A1c

## 2020-02-15 NOTE — Assessment & Plan Note (Signed)
Previously controlled cholesterol on statin and lifestyle Due for lab in 03/2020 ASCVD with known CAD  Plan: 1. RESTART Lovastatin - may take in daytime, forgetting at night 2. Continue DAPT Plavix + ASA 81mg  for secondary ASCVD risk reduction 3. Encourage improved lifestyle - low carb/cholesterol, reduce portion size, continue improving regular exercise 4. Follow-up w/ cardiology

## 2020-02-15 NOTE — Assessment & Plan Note (Addendum)
Currently flared with abnormal PHQ recent worse depression Secondary anxiety, insomnia Failed Wellbutrin, Fluoxetine. Off Xanax  On duloxetine 60mg  Will add Trazodone 50mg  nightly, titrate up to 75 to 100mg  in future Follow-up , may refer to therapist in future if insufficient improvement

## 2020-02-29 LAB — COLOGUARD: Cologuard: POSITIVE — AB

## 2020-03-02 LAB — COLOGUARD: Cologuard: POSITIVE — AB

## 2020-03-09 LAB — COLOGUARD: COLOGUARD: POSITIVE — AB

## 2020-03-13 ENCOUNTER — Other Ambulatory Visit: Payer: Self-pay | Admitting: Family Medicine

## 2020-03-13 DIAGNOSIS — I10 Essential (primary) hypertension: Secondary | ICD-10-CM

## 2020-03-13 NOTE — Telephone Encounter (Signed)
Requested medications are due for refill today?  Yes  Requested medications are on active medication list?  Yes  Last Refill:  02/23/2019  # 90 with one refill   Future visit scheduled?  Yes in one month   Notes to Clinic:  Medication failed Rx refill protocol due to no labs within in the past 180 days.  Last labs were performed on 04/19/2019.

## 2020-03-15 ENCOUNTER — Telehealth: Payer: Self-pay | Admitting: Family Medicine

## 2020-03-15 ENCOUNTER — Encounter: Payer: Self-pay | Admitting: Family Medicine

## 2020-03-15 DIAGNOSIS — F5104 Psychophysiologic insomnia: Secondary | ICD-10-CM

## 2020-03-15 DIAGNOSIS — F331 Major depressive disorder, recurrent, moderate: Secondary | ICD-10-CM

## 2020-03-15 DIAGNOSIS — R195 Other fecal abnormalities: Secondary | ICD-10-CM

## 2020-03-15 MED ORDER — TRAZODONE HCL 100 MG PO TABS: 100.0000 mg | ORAL_TABLET | Freq: Every day | ORAL | 2 refills | Status: DC

## 2020-03-15 NOTE — Telephone Encounter (Signed)
Called patient. Spoke to [place].  Summary of our conversation below  received your cologuard test result - it is POSITIVE. This is an abnormal result. And it means that we need more testing to determine if there is a problem with your colon.  Most of the time we get a positive Cologuard result, usually it is due to a Polyp that is either benign or possibly pre-cancer that is caught very early and is treatable before it ever turns into a problem or cancer.  The next step is a Colonoscopy procedure to look for the abnormality or polyp and remove and treat it. We would offer this by referring you to a GI specialist locally.  [facility]) [address] [place], [place] [address] Phone: [phone]  He agrees with plan. Referral submitted.  Also dose increased his trazodone from 50 to 100mg , new rx sent as we discussed at last visit.  Orders Placed This Encounter  Procedures  . Ambulatory referral to Gastroenterology    Referral Priority:   Routine    Referral Type:   Consultation    Referral Reason:   Specialty Services Required    Number of Visits Requested:   [place], [facility] 03/15/2020, 5:18 PM

## 2020-03-30 ENCOUNTER — Telehealth (INDEPENDENT_AMBULATORY_CARE_PROVIDER_SITE_OTHER): Payer: Self-pay | Admitting: Gastroenterology

## 2020-03-30 ENCOUNTER — Other Ambulatory Visit: Payer: Self-pay

## 2020-03-30 DIAGNOSIS — R195 Other fecal abnormalities: Secondary | ICD-10-CM

## 2020-03-30 DIAGNOSIS — K219 Gastro-esophageal reflux disease without esophagitis: Secondary | ICD-10-CM

## 2020-03-30 MED ORDER — NA SULFATE-K SULFATE-MG SULF 17.5-3.13-1.6 GM/177ML PO SOLN: 1.0000 | Freq: Once | ORAL | 0 refills | Status: AC

## 2020-03-30 NOTE — Progress Notes (Signed)
Gastroenterology Pre-Procedure Review  Request Date: Friday 04/28/20 Requesting Physician: Dr. Allen Norris  PATIENT REVIEW QUESTIONS: The patient responded to the following health history questions as indicated:    1. Are you having any GI issues? yes (Indigestion, Reflux.  Takes omeprazole however symptoms seem to be getting worse.  Feels like food is getting stuck.) Positive Cologuard 2. Do you have a personal history of Polyps? no 3. Do you have a family history of Colon Cancer or Polyps? no 4. Diabetes Mellitus? yes (type 2) 5. Joint replacements in the past 12 months?no 6. Major health problems in the past 3 months?no 7. Any artificial heart valves, MVP, or defibrillator?Patient has a stent.  Cardiac clearance will be obtained from Dr. Josefa Half.    MEDICATIONS & ALLERGIES:    Patient reports the following regarding taking any anticoagulation/antiplatelet therapy:   Plavix, Coumadin, Eliquis, Xarelto, Lovenox, Pradaxa, Brilinta, or Effient? yes (Patient takes Plavix prescribed by Dr. Parks Ranger.  Blood thinner request to be sent to his office.) Aspirin? yes (81 mg daily)  Patient confirms/reports the following medications:  Current Outpatient Medications  Medication Sig Dispense Refill  . aspirin 81 MG tablet Take 81 mg by mouth daily.    . carvedilol (COREG) 25 MG tablet TAKE 1 TABLET BY MOUTH TWICE DAILY WITH A MEAL 180 tablet 0  . clopidogrel (PLAVIX) 75 MG tablet Take 1 tablet by mouth once daily 90 tablet 3  . DULoxetine (CYMBALTA) 60 MG capsule Take 1 capsule by mouth once daily 90 capsule 1  . lisinopril-hydrochlorothiazide (ZESTORETIC) 10-12.5 MG tablet Take 1 tablet by mouth once daily 90 tablet 0  . lovastatin (MEVACOR) 40 MG tablet Take 1 tablet (40 mg total) by mouth daily. 90 tablet 3  . metFORMIN (GLUCOPHAGE) 500 MG tablet Take 2 tablets (1,000 mg total) by mouth 2 (two) times daily with a meal. 360 tablet 1  . pantoprazole (PROTONIX) 20 MG tablet Take 1 tablet (20 mg total)  by mouth daily before breakfast. Stop taking Omeprazole 90 tablet 1  . traZODone (DESYREL) 100 MG tablet Take 1 tablet (100 mg total) by mouth at bedtime. 30 tablet 2  . ipratropium (ATROVENT) 0.06 % nasal spray Place 2 sprays into both nostrils 4 (four) times daily. For up to 5-7 days then stop. (Patient not taking: Reported on 03/30/2020) 15 mL 0   No current facility-administered medications for this visit.    Patient confirms/reports the following allergies:  No Known Allergies  No orders of the defined types were placed in this encounter.   AUTHORIZATION INFORMATION Primary Insurance: 1D#: Group #:  Secondary Insurance: 1D#: Group #:  SCHEDULE INFORMATION: Date: Friday 04/28/20 Time: Location:[facility]

## 2020-04-17 ENCOUNTER — Other Ambulatory Visit: Payer: Self-pay | Admitting: *Deleted

## 2020-04-17 DIAGNOSIS — Z125 Encounter for screening for malignant neoplasm of prostate: Secondary | ICD-10-CM

## 2020-04-17 DIAGNOSIS — Z0000 Encounter for general adult medical examination without abnormal findings: Secondary | ICD-10-CM

## 2020-04-17 DIAGNOSIS — E785 Hyperlipidemia, unspecified: Secondary | ICD-10-CM

## 2020-04-17 DIAGNOSIS — F331 Major depressive disorder, recurrent, moderate: Secondary | ICD-10-CM

## 2020-04-17 DIAGNOSIS — E1169 Type 2 diabetes mellitus with other specified complication: Secondary | ICD-10-CM

## 2020-04-18 ENCOUNTER — Other Ambulatory Visit: Payer: Self-pay

## 2020-04-18 ENCOUNTER — Other Ambulatory Visit: Payer: 59

## 2020-04-19 LAB — HEMOGLOBIN A1C
Hgb A1c MFr Bld: 9.8 % of total Hgb — ABNORMAL HIGH (ref <5.7)
Mean Plasma Glucose: 235 (calc)
eAG (mmol/L): 13 (calc)

## 2020-04-19 LAB — CBC WITH DIFFERENTIAL/PLATELET
Absolute Monocytes: 551 cells/uL (ref 200–950)
Basophils Absolute: 86 cells/uL (ref 0–200)
Basophils Relative: 0.9 %
Eosinophils Absolute: 257 cells/uL (ref 15–500)
Eosinophils Relative: 2.7 %
HCT: 44.8 % (ref 38.5–50.0)
Hemoglobin: 15.4 g/dL (ref 13.2–17.1)
Lymphs Abs: 2926 cells/uL (ref 850–3900)
MCH: 31.1 pg (ref 27.0–33.0)
MCHC: 34.4 g/dL (ref 32.0–36.0)
MCV: 90.5 fL (ref 80.0–100.0)
MPV: 9.2 fL (ref 7.5–12.5)
Monocytes Relative: 5.8 %
Neutro Abs: 5681 cells/uL (ref 1500–7800)
Neutrophils Relative %: 59.8 %
Platelets: 243 10*3/uL (ref 140–400)
RBC: 4.95 10*6/uL (ref 4.20–5.80)
RDW: 12.3 % (ref 11.0–15.0)
Total Lymphocyte: 30.8 %
WBC: 9.5 10*3/uL (ref 3.8–10.8)

## 2020-04-19 LAB — COMPLETE METABOLIC PANEL WITH GFR
AG Ratio: 1.7 (calc) (ref 1.0–2.5)
ALT: 47 U/L — ABNORMAL HIGH (ref 9–46)
AST: 41 U/L — ABNORMAL HIGH (ref 10–35)
Albumin: 4.5 g/dL (ref 3.6–5.1)
Alkaline phosphatase (APISO): 91 U/L (ref 35–144)
BUN: 14 mg/dL (ref 7–25)
CO2: 25 mmol/L (ref 20–32)
Calcium: 9.6 mg/dL (ref 8.6–10.3)
Chloride: 95 mmol/L — ABNORMAL LOW (ref 98–110)
Creat: 0.81 mg/dL (ref 0.70–1.33)
GFR, Est African American: 113 mL/min/{1.73_m2} (ref >=60)
GFR, Est Non African American: 97 mL/min/{1.73_m2} (ref >=60)
Globulin: 2.7 g/dL (calc) (ref 1.9–3.7)
Glucose, Bld: 251 mg/dL — ABNORMAL HIGH (ref 65–99)
Potassium: 4.1 mmol/L (ref 3.5–5.3)
Sodium: 133 mmol/L — ABNORMAL LOW (ref 135–146)
Total Bilirubin: 0.7 mg/dL (ref 0.2–1.2)
Total Protein: 7.2 g/dL (ref 6.1–8.1)

## 2020-04-19 LAB — LIPID PANEL
Cholesterol: 172 mg/dL (ref <200)
HDL: 38 mg/dL — ABNORMAL LOW (ref >=40)
Non-HDL Cholesterol (Calc): 134 mg/dL (calc) — ABNORMAL HIGH (ref <130)
Total CHOL/HDL Ratio: 4.5 (calc) (ref <5.0)
Triglycerides: 404 mg/dL — ABNORMAL HIGH (ref <150)

## 2020-04-19 LAB — PSA: PSA: 0.46 ng/mL (ref <=4.0)

## 2020-04-19 LAB — TSH: TSH: 1.14 mIU/L (ref 0.40–4.50)

## 2020-04-24 ENCOUNTER — Encounter: Payer: Self-pay | Admitting: Anesthesiology

## 2020-04-24 ENCOUNTER — Encounter: Payer: Self-pay | Admitting: Gastroenterology

## 2020-04-24 ENCOUNTER — Telehealth: Payer: Self-pay | Admitting: Gastroenterology

## 2020-04-24 ENCOUNTER — Other Ambulatory Visit: Payer: Self-pay

## 2020-04-25 ENCOUNTER — Ambulatory Visit (INDEPENDENT_AMBULATORY_CARE_PROVIDER_SITE_OTHER): Payer: 59 | Admitting: Family Medicine

## 2020-04-25 ENCOUNTER — Encounter: Payer: Self-pay | Admitting: Family Medicine

## 2020-04-25 VITALS — BP 139/67 | HR 72 | Temp 97.3°F | Resp 16 | Ht 69.0 in | Wt 180.6 lb

## 2020-04-25 DIAGNOSIS — F5104 Psychophysiologic insomnia: Secondary | ICD-10-CM

## 2020-04-25 DIAGNOSIS — I10 Essential (primary) hypertension: Secondary | ICD-10-CM

## 2020-04-25 DIAGNOSIS — I2510 Atherosclerotic heart disease of native coronary artery without angina pectoris: Secondary | ICD-10-CM | POA: Diagnosis not present

## 2020-04-25 DIAGNOSIS — E785 Hyperlipidemia, unspecified: Secondary | ICD-10-CM

## 2020-04-25 DIAGNOSIS — E1169 Type 2 diabetes mellitus with other specified complication: Secondary | ICD-10-CM

## 2020-04-25 DIAGNOSIS — Z0000 Encounter for general adult medical examination without abnormal findings: Secondary | ICD-10-CM

## 2020-04-25 DIAGNOSIS — Z8739 Personal history of other diseases of the musculoskeletal system and connective tissue: Secondary | ICD-10-CM

## 2020-04-25 DIAGNOSIS — R0600 Dyspnea, unspecified: Secondary | ICD-10-CM

## 2020-04-25 DIAGNOSIS — F331 Major depressive disorder, recurrent, moderate: Secondary | ICD-10-CM

## 2020-04-25 DIAGNOSIS — K219 Gastro-esophageal reflux disease without esophagitis: Secondary | ICD-10-CM

## 2020-04-25 DIAGNOSIS — R0609 Other forms of dyspnea: Secondary | ICD-10-CM

## 2020-04-25 MED ORDER — TRULICITY 0.75 MG/0.5ML ~~LOC~~ SOAJ: 0.7500 mg | SUBCUTANEOUS | 0 refills | Status: DC

## 2020-04-25 MED ORDER — PANTOPRAZOLE SODIUM 40 MG PO TBEC: 40.0000 mg | DELAYED_RELEASE_TABLET | Freq: Every day | ORAL | 3 refills | Status: DC

## 2020-04-25 MED ORDER — METFORMIN HCL ER 750 MG PO TB24: 1500.0000 mg | ORAL_TABLET | Freq: Every day | ORAL | 1 refills | Status: DC

## 2020-04-25 MED ORDER — DULOXETINE HCL 60 MG PO CPEP: 60.0000 mg | ORAL_CAPSULE | Freq: Every day | ORAL | 3 refills | Status: DC

## 2020-04-25 MED ORDER — OMEGA 3 1000 MG PO CAPS: 1000.0000 mg | ORAL_CAPSULE | Freq: Two times a day (BID) | ORAL | Status: DC

## 2020-04-25 NOTE — Progress Notes (Signed)
Subjective:    Patient ID: Clifford Morse, male    DOB: 1960-09-03, 59 y.o.   MRN: [id number]  Clifford Morse is a 59 y.o. male presenting on 04/25/2020 for Annual Exam   HPI   Here for Annual Physical  Depression, major, chronic recurrent moderate Reports chronic issue Recent worsening, job shut down in July 2021 -  decreased interest or pleasure in doing things. Reduced motivation On Duloxetine 30mg  for years then increase dose to 60mg  in past 1-2 years. Now limited benefit - sleep can be mixed, with insomnia wakes up every 1-2 hours, poor sleep Failed wellbutrin in past Improved on Trazodone 100mg  nightly  DM, Type 2 / Possible neuropathy complication Last A1O up to 9.8 elevated, non adherence with med CBGs:Not checking Meds:Metformin 1000 BID missing PM dose Currently on ACEialready Lifestyle: - Diet (still improving low carb but not always - Exercise (no regular exercise History of occasional foot stinging symptoms Denies hypoglycemia, polyuria, visual changes, numbness or tingling.  HYPERLIPIDEMIA: - Reports concerns. Last lipid panel >400 triglycerides. - Currently taking lovastatin 20mg , tolerating well without side effects or myalgias  Additional concerns  Tobacco Abuse Current smoker 1.5ppd Smoking cessation - Past tried Wellbutrin, had side effect chest pain.  Episodic chest tightness and pain and dyspnea, some reduced exercise tolerance. Says only can do activity for about 10 min. He prefers new cardiologist. - He had some tinnitus or pulsations in ears, some dizziness at times.  Joint knots. Asks about further testing for these.  Nauseated episode. Increasing burping belching on low dose omeprazole.   Health Maintenance: No updated COVID19 vaccine.  PSA 0.46 negative 03/2020.  Colonoscopy was re-scheduled needed to see cardiologist.  Depression screen [facility] 2/9 04/26/2020 02/14/2020 04/21/2019  Decreased Interest 2 3 1   Down, Depressed,  Hopeless 2 2 1   PHQ - 2 Score 4 5 2   Altered sleeping 1 1 0  Tired, decreased energy 3 3 2   Change in appetite 1 1 0  Feeling bad or failure about yourself  1 1 0  Trouble concentrating 1 1 0  Moving slowly or fidgety/restless 0 0 0  Suicidal thoughts 0 1 0  PHQ-9 Score 11 13 4   Difficult doing work/chores Not difficult at all Somewhat difficult Not difficult at all  Some recent data might be hidden    Past Medical History:  Diagnosis Date  . Allergy   . Arthritis   . Asthma   . Chest pain on exertion   . Diabetes mellitus, type 2 ([place])   . GERD (gastroesophageal reflux disease)   . Hyperlipidemia   . Myocardial infarction ([place]) 2014  . Vertigo    with sinus issues   Past Surgical History:  Procedure Laterality Date  . CARDIAC CATHETERIZATION  2014   stent  . ESOPHAGOGASTRODUODENOSCOPY    . HERNIA REPAIR     Social History   Socioeconomic History  . Marital status: Married    Spouse name: Not on file  . Number of children: Not on file  . Years of education: Not on file  . Highest education level: Not on file  Occupational History  . Not on file  Tobacco Use  . Smoking status: Current Every Day Smoker    Packs/day: 1.50    Years: 25.00    Pack years: 37.50    Types: Cigarettes  . Smokeless tobacco: Former Systems developer  . Tobacco comment: since approx age 41  Vaping Use  . Vaping Use: Never used  Substance and Sexual Activity  . Alcohol use: Yes    Alcohol/week: 0.0 standard drinks    Comment: rare  . Drug use: No  . Sexual activity: Not on file  Other Topics Concern  . Not on file  Social History Narrative  . Not on file   Social Determinants of Health   Financial Resource Strain:   . Difficulty of Paying Living Expenses: Not on file  Food Insecurity:   . Worried About Charity fundraiser in the Last Year: Not on file  . Ran Out of Food in the Last Year: Not on file  Transportation Needs:   . Lack of Transportation (Medical): Not on file  . Lack of  Transportation (Non-Medical): Not on file  Physical Activity:   . Days of Exercise per Week: Not on file  . Minutes of Exercise per Session: Not on file  Stress:   . Feeling of Stress : Not on file  Social Connections:   . Frequency of Communication with Friends and Family: Not on file  . Frequency of Social Gatherings with Friends and Family: Not on file  . Attends Religious Services: Not on file  . Active Member of Clubs or Organizations: Not on file  . Attends Archivist Meetings: Not on file  . Marital Status: Not on file  Intimate Partner Violence:   . Fear of Current or Ex-Partner: Not on file  . Emotionally Abused: Not on file  . Physically Abused: Not on file  . Sexually Abused: Not on file   Family History  Problem Relation Age of Onset  . Emphysema Father   . Diabetes Mother    Current Outpatient Medications on File Prior to Visit  Medication Sig  . aspirin 81 MG tablet Take 81 mg by mouth daily.  . carvedilol (COREG) 25 MG tablet TAKE 1 TABLET BY MOUTH TWICE DAILY WITH A MEAL  . clopidogrel (PLAVIX) 75 MG tablet Take 1 tablet by mouth once daily  . ipratropium (ATROVENT) 0.06 % nasal spray Place 2 sprays into both nostrils 4 (four) times daily. For up to 5-7 days then stop.  Marland Kitchen lisinopril-hydrochlorothiazide (ZESTORETIC) 10-12.5 MG tablet Take 1 tablet by mouth once daily  . lovastatin (MEVACOR) 40 MG tablet Take 1 tablet (40 mg total) by mouth daily.  . traZODone (DESYREL) 100 MG tablet Take 1 tablet (100 mg total) by mouth at bedtime.   No current facility-administered medications on file prior to visit.    Review of Systems  Constitutional: Negative for activity change, appetite change, chills, diaphoresis, fatigue and fever.  HENT: Negative for congestion and hearing loss.   Eyes: Negative for visual disturbance.  Respiratory: Negative for apnea, cough, chest tightness, shortness of breath and wheezing.   Cardiovascular: Negative for chest pain,  palpitations and leg swelling.  Gastrointestinal: Negative for abdominal pain, anal bleeding, blood in stool, constipation, diarrhea, nausea and vomiting.  Endocrine: Negative for cold intolerance.  Genitourinary: Negative for decreased urine volume, difficulty urinating, dysuria, frequency and hematuria.  Musculoskeletal: Negative for arthralgias, back pain and neck pain.  Skin: Negative for rash.  Allergic/Immunologic: Negative for environmental allergies.  Neurological: Negative for dizziness, weakness, light-headedness, numbness and headaches.  Hematological: Negative for adenopathy.  Psychiatric/Behavioral: Positive for dysphoric mood and sleep disturbance. Negative for behavioral problems. The patient is not nervous/anxious.    Per HPI unless specifically indicated above     Objective:    BP 139/67   Pulse 72   Temp (!) 97.3  F (36.3 C) (Temporal)   Resp 16   Ht 5\' 9"  (1.753 m)   Wt 180 lb 9.6 oz (81.9 kg)   SpO2 99%   BMI 26.67 kg/m   Wt Readings from Last 3 Encounters:  04/25/20 180 lb 9.6 oz (81.9 kg)  02/14/20 179 lb (81.2 kg)  01/18/19 187 lb (84.8 kg)    Physical Exam Vitals and nursing note reviewed.  Constitutional:      General: He is not in acute distress.    Appearance: He is well-developed. He is not diaphoretic.     Comments: Well-appearing, comfortable, cooperative  HENT:     Head: Normocephalic and atraumatic.  Eyes:     General:        Right eye: No discharge.        Left eye: No discharge.     Conjunctiva/sclera: Conjunctivae normal.     Pupils: Pupils are equal, round, and reactive to light.  Neck:     Thyroid: No thyromegaly.     Vascular: No carotid bruit.  Cardiovascular:     Rate and Rhythm: Normal rate and regular rhythm.     Heart sounds: Normal heart sounds. No murmur heard.   Pulmonary:     Effort: Pulmonary effort is normal. No respiratory distress.     Breath sounds: Normal breath sounds. No wheezing or rales.  Abdominal:      General: Bowel sounds are normal. There is no distension.     Palpations: Abdomen is soft. There is no mass.     Tenderness: There is no abdominal tenderness.  Musculoskeletal:        General: No tenderness. Normal range of motion.     Cervical back: Normal range of motion and neck supple.     Right lower leg: No edema.     Left lower leg: No edema.     Comments: Upper / Lower Extremities: - Normal muscle tone, strength bilateral upper extremities 5/5, lower extremities 5/5  Lymphadenopathy:     Cervical: No cervical adenopathy.  Skin:    General: Skin is warm and dry.     Findings: No erythema or rash.  Neurological:     Mental Status: He is alert and oriented to person, place, and time.     Comments: Distal sensation intact to light touch all extremities  Psychiatric:        Behavior: Behavior normal.     Comments: Well groomed, good eye contact, normal speech and thoughts    Results for orders placed or performed in visit on 04/25/20  Uric acid  Result Value Ref Range   Uric Acid, Serum 3.8 (L) 4.0 - 8.0 mg/dL      Assessment & Plan:   Problem List Items Addressed This Visit    Type 2 diabetes mellitus with other specified complication ([place])    Worsened A1c up to 9.8, issue with adherence/lifestyle Complications - Hyperlipidemia and CAD, Retinopathy Strong fam history of DM  Plan:  1. Switch metformin from IR - forgetting PM dose, now to XR 750mg  x 2 = 1500 in AM - ADD new start GLP1 Trulicity 0.75mg  weekly inj, sample given x 2 doses, can order more when patient request 2. Continue ASA, ACEi, Statin      Relevant Medications   metFORMIN (GLUCOPHAGE XR) 750 MG 24 hr tablet   Dulaglutide (TRULICITY) 0.25 [place]/7.0WC SOPN   Psychophysiological insomnia   Major depressive disorder, recurrent, moderate (HCC)    Currently flared with abnormal PHQ  recent worse depression Secondary anxiety, insomnia Failed Wellbutrin, Fluoxetine. Off Xanax  On duloxetine 60mg  Trazodone  100mg  Follow-up , may refer to therapist in future if insufficient improvement       Relevant Medications   DULoxetine (CYMBALTA) 60 MG capsule   Hyperlipidemia associated with type 2 diabetes mellitus ([place])    Uncontrolled elevated TG >400 ASCVD with known CAD  Plan: 1. Continue Lovastatin 2. Continue DAPT Plavix + ASA 81mg  for secondary ASCVD risk reduction 3. Encourage improved lifestyle - low carb/cholesterol, reduce portion size, continue improving regular exercise      Relevant Medications   metFORMIN (GLUCOPHAGE XR) 750 MG 24 hr tablet   Dulaglutide (TRULICITY) 6.64 QI/3.4VQ SOPN   Omega 3 1000 MG CAPS   GERD (gastroesophageal reflux disease)    Previous uncontrolled on low dose PPI Increase Pantoprazole to 40mg  daily      Relevant Medications   pantoprazole (PROTONIX) 40 MG tablet   Essential hypertension    Mild elevated BP, manual re-check improved - Home BP readings limited Complication with known CAD    Plan:  1. Continue current BP regimen - Carvedilol 25mg  BID, Lisinoipril-HCTZ 10-12.5mg  daily 2. Encourage improved lifestyle - low sodium diet, regular exercise improve 3. May monitor BP outside office, bring readings to next visit, if persistently >140/90 or new symptoms notify office sooner      CAD (coronary artery disease)    Concern for anginal symptoms in past 6 months Chronic problem with 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 No longer followed by [facility] Cardiology Dr Saralyn Pilar, last 2016 On Plavix ASA, BB, ACEi, Statin      Relevant Orders   Ambulatory referral to Cardiology    Other Visit Diagnoses    Annual physical exam    -  Primary   Dyspnea on exertion       Relevant Orders   Ambulatory referral to Cardiology   History of gout       Relevant Orders   Uric acid (Completed)   Moderate episode of recurrent major depressive disorder ([place])       Relevant Medications   DULoxetine (CYMBALTA) 60 MG capsule      Updated Health Maintenance  information Reviewed recent lab results with patient Encouraged improvement to lifestyle with diet and exercise - Goal of weight loss  referral to Cardiology for persistent episodic anginal symptoms with dyspnea and chest tightness/pain on exertion over past >6 months, has known CAD history, has not been back to cardiology in several years, previously with [facility], now relocating, he is smoker and uncontrolled diabetic.   Orders Placed This Encounter  Procedures  . Uric acid  . Ambulatory referral to Cardiology    Referral Priority:   Routine    Referral Type:   Consultation    Referral Reason:   Specialty Services Required    Requested Specialty:   Cardiology    Number of Visits Requested:   1     Meds ordered this encounter  Medications  . metFORMIN (GLUCOPHAGE XR) 750 MG 24 hr tablet    Sig: Take 2 tablets (1,500 mg total) by mouth daily with breakfast.    Dispense:  180 tablet    Refill:  1  . Dulaglutide (TRULICITY) 2.59 DG/3.8VF SOPN    Sig: Inject 0.75 mg into the skin once a week.    Dispense:  1 mL    Refill:  0  . pantoprazole (PROTONIX) 40 MG tablet    Sig: Take 1 tablet (40  mg total) by mouth daily before breakfast.    Dispense:  90 tablet    Refill:  3  . Omega 3 1000 MG CAPS    Sig: Take 1 capsule (1,000 mg total) by mouth 2 (two) times daily before a meal.    Dispense:  60 capsule  . DULoxetine (CYMBALTA) 60 MG capsule    Sig: Take 1 capsule (60 mg total) by mouth daily.    Dispense:  90 capsule    Refill:  3      Follow up plan: Return in about 6 weeks (around 06/06/2020) for 6 week follow-up diabetes, cardiology, GERD, smoking, knee x-ray.  Nobie Putnam, DO [facility] 04/25/2020, 8:14 AM

## 2020-04-25 NOTE — Patient Instructions (Addendum)
Thank you for coming to the office today.  For Diabetes  Switch metformin to XR 750mg  x 2 = 1500mg  daily in morning every day.  START Trulicity 0.75mg  weekly injection  Increase Pantoprazole from 20 to 40mg  daily  Try nicotine patches  For cholesterol  Add Fish Oil omega 3 1000 mg capsule twice a day with meal  Referral to Cardiology  [facility]) [facility] at [place], [place] [address] Main: [phone]   If you have any significant chest pain that does not go away within 30 minutes, is accompanied by nausea, sweating, shortness of breath, or made worse by activity, this may be evidence of a heart attack, especially if symptoms worsening instead of improving, please call 911 or go directly to the emergency room immediately for evaluation.   Please schedule a Follow-up Appointment to: Return in about 6 weeks (around 06/06/2020) for 6 week follow-up diabetes, cardiology, GERD, smoking, knee x-ray.  If you have any other questions or concerns, please feel free to call the office or send a message through [place]. You may also schedule an earlier appointment if necessary.  Additionally, you may be receiving a survey about your experience at our office within a few days to 1 week by e-mail or mail. We value your feedback.  Nobie Putnam, DO [place]

## 2020-04-26 ENCOUNTER — Other Ambulatory Visit: Admission: RE | Admit: 2020-04-26 | Payer: 59 | Source: Ambulatory Visit

## 2020-04-26 LAB — URIC ACID: Uric Acid, Serum: 3.8 mg/dL — ABNORMAL LOW (ref 4.0–8.0)

## 2020-04-26 NOTE — Assessment & Plan Note (Signed)
Previous uncontrolled on low dose PPI Increase Pantoprazole to 40mg  daily

## 2020-04-26 NOTE — Assessment & Plan Note (Signed)
Concern for anginal symptoms in past 6 months Chronic problem with 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 No longer followed by [facility] Cardiology Dr Saralyn Pilar, last 2016 On Plavix ASA, BB, ACEi, Statin

## 2020-04-26 NOTE — Assessment & Plan Note (Signed)
Worsened A1c up to 9.8, issue with adherence/lifestyle Complications - Hyperlipidemia and CAD, Retinopathy Strong fam history of DM  Plan:  1. Switch metformin from IR - forgetting PM dose, now to XR 750mg  x 2 = 1500 in AM - ADD new start GLP1 Trulicity 0.75mg  weekly inj, sample given x 2 doses, can order more when patient request 2. Continue ASA, ACEi, Statin

## 2020-04-26 NOTE — Assessment & Plan Note (Signed)
Uncontrolled elevated TG >400 ASCVD with known CAD  Plan: 1. Continue Lovastatin 2. Continue DAPT Plavix + ASA 81mg  for secondary ASCVD risk reduction 3. Encourage improved lifestyle - low carb/cholesterol, reduce portion size, continue improving regular exercise

## 2020-04-26 NOTE — Assessment & Plan Note (Signed)
Mild elevated BP, manual re-check improved - Home BP readings limited Complication with known CAD    Plan:  1. Continue current BP regimen - Carvedilol 25mg  BID, Lisinoipril-HCTZ 10-12.5mg  daily 2. Encourage improved lifestyle - low sodium diet, regular exercise improve 3. May monitor BP outside office, bring readings to next visit, if persistently >140/90 or new symptoms notify office sooner

## 2020-04-26 NOTE — Assessment & Plan Note (Signed)
Currently flared with abnormal PHQ recent worse depression Secondary anxiety, insomnia Failed Wellbutrin, Fluoxetine. Off Xanax  On duloxetine 60mg  Trazodone 100mg  Follow-up , may refer to therapist in future if insufficient improvement

## 2020-04-28 ENCOUNTER — Ambulatory Visit: Admission: RE | Admit: 2020-04-28 | Payer: 59 | Source: Home / Self Care | Admitting: Gastroenterology

## 2020-04-28 HISTORY — DX: Dizziness and giddiness: R42

## 2020-04-28 HISTORY — DX: Type 2 diabetes mellitus without complications: E11.9

## 2020-04-28 HISTORY — DX: Chest pain, unspecified: R07.9

## 2020-04-28 SURGERY — COLONOSCOPY WITH PROPOFOL
Anesthesia: Choice

## 2020-05-09 ENCOUNTER — Ambulatory Visit (INDEPENDENT_AMBULATORY_CARE_PROVIDER_SITE_OTHER): Payer: 59 | Admitting: Cardiology

## 2020-05-09 ENCOUNTER — Other Ambulatory Visit: Payer: Self-pay

## 2020-05-09 ENCOUNTER — Encounter: Payer: Self-pay | Admitting: Cardiology

## 2020-05-09 VITALS — BP 140/78 | HR 67 | Ht 69.0 in | Wt 177.4 lb

## 2020-05-09 DIAGNOSIS — F17200 Nicotine dependence, unspecified, uncomplicated: Secondary | ICD-10-CM | POA: Diagnosis not present

## 2020-05-09 DIAGNOSIS — I10 Essential (primary) hypertension: Secondary | ICD-10-CM | POA: Diagnosis not present

## 2020-05-09 DIAGNOSIS — E7800 Pure hypercholesterolemia, unspecified: Secondary | ICD-10-CM

## 2020-05-09 DIAGNOSIS — R072 Precordial pain: Secondary | ICD-10-CM

## 2020-05-09 DIAGNOSIS — I2510 Atherosclerotic heart disease of native coronary artery without angina pectoris: Secondary | ICD-10-CM | POA: Diagnosis not present

## 2020-05-09 MED ORDER — FENOFIBRATE 145 MG PO TABS: 145.0000 mg | ORAL_TABLET | Freq: Every day | ORAL | 5 refills | Status: DC

## 2020-05-09 NOTE — Patient Instructions (Signed)
Medication Instructions:   Your physician has recommended you make the following change in your medication:   1.  STARt taking fenofibrate (TRICOR) 145 MG tablet:  Take 1 tablet (145 mg total) by mouth daily.  *If you need a refill on your cardiac medications before your next appointment, please call your pharmacy*   Lab Work: None Ordered If you have labs (blood work) drawn today and your tests are completely normal, you will receive your results only by: Marland Kitchen MyChart Message (if you have MyChart) OR . A paper copy in the mail If you have any lab test that is abnormal or we need to change your treatment, we will call you to review the results.   Testing/Procedures:  1.  Your physician has requested that you have an echocardiogram. Echocardiography is a painless test that uses sound waves to create images of your heart. It provides your doctor with information about the size and shape of your heart and how well your heart's chambers and valves are working. This procedure takes approximately one hour. There are no restrictions for this procedure.  2.  [place]       Your caregiver has ordered a Stress Test with nuclear imaging. The purpose of this test is to evaluate the blood supply to your heart muscle. This procedure is referred to as a "Non-Invasive Stress Test." This is because other than having an IV started in your vein, nothing is inserted or "invades" your body. Cardiac stress tests are done to find areas of poor blood flow to the heart by determining the extent of coronary artery disease (CAD). Some patients exercise on a treadmill, which naturally increases the blood flow to your heart, while others who are  unable to walk on a treadmill due to physical limitations have a pharmacologic/chemical stress agent called Lexiscan . This medicine will mimic walking on a treadmill by temporarily increasing your coronary blood flow.      PLEASE REPORT TO [facility]ICAL [facility] ENTRANCE   THE  VOLUNTEERS AT THE FIRST DESK WILL DIRECT YOU WHERE TO GO     *Please note: these test may take anywhere between 2-4 hours to complete       Date of Procedure:_____________________________________   Arrival Time for Procedure:______________________________    PLEASE NOTIFY THE OFFICE AT LEAST 24 HOURS IN ADVANCE IF YOU ARE UNABLE TO KEEP YOUR APPOINTMENT.  [place] 24 HOURS IN ADVANCE IF YOU ARE UNABLE TO KEEP YOUR APPOINTMENT. [phone]         How to prepare for your Myoview test:         _XX___:  Hold diabetes medication the morning of procedure:   metFORMIN (GLUCOPHAGE XR) 750 MG 24 hr tablet   _XX__:  Hold betablocker(s) night before procedure and morning of procedure:     carvedilol (COREG) 25 MG tablet    1. Do not eat or drink after midnight  2. No caffeine for 24 hours prior to test  3. No smoking 24 hours prior to test.  4. Unless instructed otherwise, Take your medication with a small sips of water.    5. No perfume, cologne or lotion.  6. Wear comfortable walking shoes. No heels!     Follow-Up: At [facility], you and your health needs are our priority.  As part of our continuing mission to provide you with exceptional heart care, we have created designated Provider Care Teams.  These Care Teams include  your primary Cardiologist (physician) and Advanced Practice Providers (APPs -  Physician Assistants and Nurse Practitioners) who all work together to provide you with the care you need, when you need it.  We recommend signing up for the patient portal called "MyChart".  Sign up information is provided on this After Visit Summary.  MyChart is used to connect with patients for Virtual Visits (Telemedicine).  Patients are able to view lab/test results, encounter notes, upcoming appointments, etc.  Non-urgent messages can be sent to your provider as well.   To learn more about what you can do with  MyChart, go to [email or web address].    Your next appointment:   Follow up after Myoview and Echo   The format for your next appointment:   In Person  Provider:   Kate Sable, MD   Other Instructions

## 2020-05-09 NOTE — Progress Notes (Signed)
Cardiology Office Note:    Date:  05/09/2020   ID:  Clifford Morse, DOB 02-28-1961, MRN [id number]  PCP:  Olin Hauser, DO  [place] Cardiologist:  Kate Sable, MD  [place] Electrophysiologist:  None   Referring MD: Nobie Putnam *   Chief Complaint  Patient presents with  . New Patient (Initial Visit)    Ref by Dr. Parks Ranger to establish care for a history of CAD; former Dr. Saralyn Pilar patient. Pt. c/o mild chest pain and shortness of breath mostly with acitivity. Pt. also needs a cardiac clearance to have a colonosocpy.    Clifford Morse is a 59 y.o. male who is being seen today for the evaluation of chest pain at the request of Nobie Putnam *.   History of Present Illness:    Clifford Morse is a 59 y.o. male with a hx of MI/CAD (s/p DES to mid LAD 03/2013), hypertension, hyperlipidemia, current smoker x35+ years, COPD who presents due to chest pain.  She states having symptoms of chest tightness with exertion typically when he takes already stress.  Rates discomfort as 3 out of 10 in severity.  Sometimes associated with shortness of breath.  Resting alleviates symptoms.  Denies edema, palpitations.  Is working on smoking cessation, just purchased some nicotine patch.  Takes all his medications as prescribed.  Had an MI in 2014, drug-eluting stent was placed to the LAD.  Previously seen by [facility] cardiology.  Past Medical History:  Diagnosis Date  . Allergy   . Arthritis   . Asthma   . Chest pain on exertion   . Diabetes mellitus, type 2 ([place])   . GERD (gastroesophageal reflux disease)   . Hyperlipidemia   . Myocardial infarction ([place]) 2014  . Vertigo    with sinus issues    Past Surgical History:  Procedure Laterality Date  . CARDIAC CATHETERIZATION  2014   stent  . ESOPHAGOGASTRODUODENOSCOPY    . HERNIA REPAIR      Current Medications: Current Meds  Medication Sig  . aspirin 81 MG tablet Take 81 mg by mouth daily.  .  carvedilol (COREG) 25 MG tablet TAKE 1 TABLET BY MOUTH TWICE DAILY WITH A MEAL  . clopidogrel (PLAVIX) 75 MG tablet Take 1 tablet by mouth once daily  . Dulaglutide (TRULICITY) 2.95 MW/4.1LK SOPN Inject 0.75 mg into the skin once a week.  . DULoxetine (CYMBALTA) 60 MG capsule Take 1 capsule (60 mg total) by mouth daily.  Marland Kitchen lisinopril-hydrochlorothiazide (ZESTORETIC) 10-12.5 MG tablet Take 1 tablet by mouth once daily  . lovastatin (MEVACOR) 40 MG tablet Take 1 tablet (40 mg total) by mouth daily.  . metFORMIN (GLUCOPHAGE XR) 750 MG 24 hr tablet Take 2 tablets (1,500 mg total) by mouth daily with breakfast.  . Omega 3 1000 MG CAPS Take 1 capsule (1,000 mg total) by mouth 2 (two) times daily before a meal.  . pantoprazole (PROTONIX) 40 MG tablet Take 1 tablet (40 mg total) by mouth daily before breakfast.  . traZODone (DESYREL) 100 MG tablet Take 1 tablet (100 mg total) by mouth at bedtime.     Allergies:   Patient has no known allergies.   Social History   Socioeconomic History  . Marital status: Married    Spouse name: Not on file  . Number of children: Not on file  . Years of education: Not on file  . Highest education level: Not on file  Occupational History  . Not on file  Tobacco Use  . Smoking status: Current Every Day Smoker    Packs/day: 1.50    Years: 25.00    Pack years: 37.50    Types: Cigarettes  . Smokeless tobacco: Former Systems developer  . Tobacco comment: since approx age 40  Vaping Use  . Vaping Use: Never used  Substance and Sexual Activity  . Alcohol use: Yes    Alcohol/week: 0.0 standard drinks    Comment: rare  . Drug use: No  . Sexual activity: Not on file  Other Topics Concern  . Not on file  Social History Narrative  . Not on file   Social Determinants of Health   Financial Resource Strain:   . Difficulty of Paying Living Expenses: Not on file  Food Insecurity:   . Worried About Charity fundraiser in the Last Year: Not on file  . Ran Out of Food in the  Last Year: Not on file  Transportation Needs:   . Lack of Transportation (Medical): Not on file  . Lack of Transportation (Non-Medical): Not on file  Physical Activity:   . Days of Exercise per Week: Not on file  . Minutes of Exercise per Session: Not on file  Stress:   . Feeling of Stress : Not on file  Social Connections:   . Frequency of Communication with Friends and Family: Not on file  . Frequency of Social Gatherings with Friends and Family: Not on file  . Attends Religious Services: Not on file  . Active Member of Clubs or Organizations: Not on file  . Attends Archivist Meetings: Not on file  . Marital Status: Not on file     Family History: The patient's family history includes Diabetes in his mother; Emphysema in his father.  ROS:   Please see the history of present illness.     All other systems reviewed and are negative.  EKGs/Labs/Other Studies Reviewed:    The following studies were reviewed today:   EKG:  EKG is  ordered today.  The ekg ordered today demonstrates normal sinus rhythm, normal ECG.  Recent Labs: 04/18/2020: ALT 47; BUN 14; Creat 0.81; Hemoglobin 15.4; Platelets 243; Potassium 4.1; Sodium 133; TSH 1.14  Recent Lipid Panel    Component Value Date/Time   CHOL 172 04/18/2020 0810   CHOL 163 06/23/2015 0933   CHOL 209 (H) 02/28/2013 0324   TRIG 404 (H) 04/18/2020 0810   TRIG 247 (H) 02/28/2013 0324   HDL 38 (L) 04/18/2020 0810   HDL 41 06/23/2015 0933   HDL 38 (L) 02/28/2013 0324   CHOLHDL 4.5 04/18/2020 0810   VLDL 43 (H) 08/26/2016 0001   VLDL 49 (H) 02/28/2013 0324   LDLCALC  04/18/2020 0810     Comment:     . LDL cholesterol not calculated. Triglyceride levels greater than 400 mg/dL invalidate calculated LDL results. . Reference range: <100 . Desirable range <100 mg/dL for primary prevention;   <70 mg/dL for patients with CHD or diabetic patients  with > or = 2 CHD risk factors. Marland Kitchen LDL-C is now calculated using the  Martin-Hopkins  calculation, which is a validated novel method providing  better accuracy than the Friedewald equation in the  estimation of LDL-C.  Cresenciano Genre et al. Annamaria Helling. 1610;960(45): 2061-2068  (http://education.QuestDiagnostics.com/faq/FAQ164)    LDLCALC 122 (H) 02/28/2013 0324     Risk Assessment/Calculations:      Physical Exam:    VS:  BP 140/78 (BP Location: Right Arm, Patient Position: Sitting, Cuff  Size: Normal)   Pulse 67   Ht 5\' 9"  (1.753 m)   Wt 177 lb 6 oz (80.5 kg)   SpO2 97%   BMI 26.19 kg/m     Wt Readings from Last 3 Encounters:  05/09/20 177 lb 6 oz (80.5 kg)  04/25/20 180 lb 9.6 oz (81.9 kg)  02/14/20 179 lb (81.2 kg)     GEN:  Well nourished, well developed in no acute distress HEENT: Normal NECK: No JVD; No carotid bruits LYMPHATICS: No lymphadenopathy CARDIAC: RRR, no murmurs, rubs, gallops RESPIRATORY:  Clear to auscultation without rales, wheezing or rhonchi  ABDOMEN: Soft, non-tender, non-distended MUSCULOSKELETAL:  No edema; No deformity  SKIN: Warm and dry NEUROLOGIC:  Alert and oriented x 3 PSYCHIATRIC:  Normal affect   ASSESSMENT:    1. Coronary artery disease involving native coronary artery of native heart, unspecified whether angina present   2. Pure hypercholesterolemia   3. Primary hypertension   4. Smoking   5. Precordial pain    PLAN:    In order of problems listed above:  1. Patient with history of CAD/MI s/p drug-eluting stent to LAD.  Has symptoms consistent with angina.  Get echocardiogram to evaluate wall motion abnormalities, get Lexiscan Myoview to evaluate presence of ischemia.  Continue aspirin, Plavix, statin.  If testing normal, will consider addition of antiangina medication/imdur 2. Last lipid panel showed normal total cholesterol, elevated triglycerides.  Start fenofibrate.  LDL could not be evaluated. 3. History of hypertension, blood pressure okay for now.  Continue Coreg, lisinopril, HCTZ as  prescribed. 4. Patient is a current smoker, cessation advised, over 5 minutes spent counseling patient.  Follow-up after echocardiogram and Myoview.    Shared Decision Making/Informed Consent      Medication Adjustments/Labs and Tests Ordered: Current medicines are reviewed at length with the patient today.  Concerns regarding medicines are outlined above.  Orders Placed This Encounter  Procedures  . NM Myocar Multi W/Spect W/Wall Motion / EF  . ECHOCARDIOGRAM COMPLETE   Meds ordered this encounter  Medications  . fenofibrate (TRICOR) 145 MG tablet    Sig: Take 1 tablet (145 mg total) by mouth daily.    Dispense:  30 tablet    Refill:  5    Patient Instructions  Medication Instructions:   Your physician has recommended you make the following change in your medication:   1.  STARt taking fenofibrate (TRICOR) 145 MG tablet:  Take 1 tablet (145 mg total) by mouth daily.  *If you need a refill on your cardiac medications before your next appointment, please call your pharmacy*   Lab Work: None Ordered If you have labs (blood work) drawn today and your tests are completely normal, you will receive your results only by: Marland Kitchen MyChart Message (if you have MyChart) OR . A paper copy in the mail If you have any lab test that is abnormal or we need to change your treatment, we will call you to review the results.   Testing/Procedures:  1.  Your physician has requested that you have an echocardiogram. Echocardiography is a painless test that uses sound waves to create images of your heart. It provides your doctor with information about the size and shape of your heart and how well your heart's chambers and valves are working. This procedure takes approximately one hour. There are no restrictions for this procedure.  2.  [place]       Your caregiver has ordered a Stress Test with nuclear imaging.  The purpose of this test is to evaluate the blood supply to your heart muscle.  This procedure is referred to as a "Non-Invasive Stress Test." This is because other than having an IV started in your vein, nothing is inserted or "invades" your body. Cardiac stress tests are done to find areas of poor blood flow to the heart by determining the extent of coronary artery disease (CAD). Some patients exercise on a treadmill, which naturally increases the blood flow to your heart, while others who are  unable to walk on a treadmill due to physical limitations have a pharmacologic/chemical stress agent called Lexiscan . This medicine will mimic walking on a treadmill by temporarily increasing your coronary blood flow.      PLEASE REPORT TO [facility] MEDICAL MALL ENTRANCE   THE VOLUNTEERS AT THE FIRST DESK WILL DIRECT YOU WHERE TO GO     *Please note: these test may take anywhere between 2-4 hours to complete       Date of Procedure:_____________________________________   Arrival Time for Procedure:______________________________    PLEASE NOTIFY THE OFFICE AT LEAST 24 HOURS IN ADVANCE IF YOU ARE UNABLE TO KEEP YOUR APPOINTMENT.  [place] 24 HOURS IN ADVANCE IF YOU ARE UNABLE TO KEEP YOUR APPOINTMENT. [phone]         How to prepare for your Myoview test:         _XX___:  Hold diabetes medication the morning of procedure:   metFORMIN (GLUCOPHAGE XR) 750 MG 24 hr tablet   _XX__:  Hold betablocker(s) night before procedure and morning of procedure:     carvedilol (COREG) 25 MG tablet    1. Do not eat or drink after midnight  2. No caffeine for 24 hours prior to test  3. No smoking 24 hours prior to test.  4. Unless instructed otherwise, Take your medication with a small sips of water.    5. No perfume, cologne or lotion.  6. Wear comfortable walking shoes. No heels!     Follow-Up: At [facility], you and your health needs are our priority.  As part of our continuing mission to provide you with  exceptional heart care, we have created designated Provider Care Teams.  These Care Teams include your primary Cardiologist (physician) and Advanced Practice Providers (APPs -  Physician Assistants and Nurse Practitioners) who all work together to provide you with the care you need, when you need it.  We recommend signing up for the patient portal called "MyChart".  Sign up information is provided on this After Visit Summary.  MyChart is used to connect with patients for Virtual Visits (Telemedicine).  Patients are able to view lab/test results, encounter notes, upcoming appointments, etc.  Non-urgent messages can be sent to your provider as well.   To learn more about what you can do with MyChart, go to [email or web address].    Your next appointment:   Follow up after Myoview and Echo   The format for your next appointment:   In Person  Provider:   Kate Sable, MD   Other Instructions      Signed, Kate Sable, MD  05/09/2020 10:47 AM    [place]

## 2020-05-10 NOTE — Addendum Note (Signed)
Addended by: Anselm Pancoast on: 05/10/2020 10:48 AM   Modules accepted: Orders

## 2020-05-19 ENCOUNTER — Ambulatory Visit
Admission: RE | Admit: 2020-05-19 | Discharge: 2020-05-19 | Disposition: A | Discharge status: Home / Self Care | Payer: 59 | Source: Ambulatory Visit | Attending: Cardiology | Admitting: Cardiology

## 2020-05-19 ENCOUNTER — Other Ambulatory Visit: Payer: Self-pay

## 2020-05-19 DIAGNOSIS — R072 Precordial pain: Secondary | ICD-10-CM | POA: Diagnosis not present

## 2020-05-19 LAB — NM MYOCAR MULTI W/SPECT W/WALL MOTION / EF
Estimated workload: 1 METS
Exercise duration (min): 0 min
Exercise duration (sec): 0 s
LV dias vol: 77 mL (ref 62–150)
LV sys vol: 25 mL
MPHR: 160 {beats}/min
Peak HR: 122 {beats}/min
Percent HR: 76 %
Rest HR: 72 {beats}/min
SDS: 5
SRS: 2
SSS: 7
TID: 1.14

## 2020-05-19 MED ORDER — REGADENOSON 0.4 MG/5ML IV SOLN
0.4000 mg | Freq: Once | INTRAVENOUS | Status: AC
  Administered 2020-05-19: 0.4 mg via INTRAVENOUS

## 2020-05-19 MED ORDER — TECHNETIUM TC 99M TETROFOSMIN IV KIT
10.0000 | PACK | Freq: Once | INTRAVENOUS | Status: AC | PRN
  Administered 2020-05-19: 10 via INTRAVENOUS

## 2020-05-19 MED ORDER — TECHNETIUM TC 99M TETROFOSMIN IV KIT
30.0000 | PACK | Freq: Once | INTRAVENOUS | Status: AC | PRN
  Administered 2020-05-19: 30.96 via INTRAVENOUS

## 2020-05-22 ENCOUNTER — Telehealth: Payer: Self-pay | Admitting: Cardiology

## 2020-05-22 ENCOUNTER — Ambulatory Visit (INDEPENDENT_AMBULATORY_CARE_PROVIDER_SITE_OTHER): Payer: 59 | Admitting: Family

## 2020-05-22 ENCOUNTER — Other Ambulatory Visit: Payer: Self-pay

## 2020-05-22 ENCOUNTER — Encounter: Payer: Self-pay | Admitting: Family

## 2020-05-22 VITALS — BP 132/80 | HR 86 | Ht 69.0 in | Wt 175.0 lb

## 2020-05-22 DIAGNOSIS — E785 Hyperlipidemia, unspecified: Secondary | ICD-10-CM

## 2020-05-22 DIAGNOSIS — I10 Essential (primary) hypertension: Secondary | ICD-10-CM | POA: Diagnosis not present

## 2020-05-22 DIAGNOSIS — R9439 Abnormal result of other cardiovascular function study: Secondary | ICD-10-CM | POA: Diagnosis not present

## 2020-05-22 DIAGNOSIS — E1165 Type 2 diabetes mellitus with hyperglycemia: Secondary | ICD-10-CM

## 2020-05-22 DIAGNOSIS — R0609 Other forms of dyspnea: Secondary | ICD-10-CM

## 2020-05-22 DIAGNOSIS — R0600 Dyspnea, unspecified: Secondary | ICD-10-CM

## 2020-05-22 DIAGNOSIS — I25118 Atherosclerotic heart disease of native coronary artery with other forms of angina pectoris: Secondary | ICD-10-CM

## 2020-05-22 NOTE — H&P (View-Only) (Signed)
Office Visit    Patient Name: [name] Date of Encounter: 05/22/2020  Primary Care Provider:  Olin Hauser, DO Primary Cardiologist:  Kate Sable, MD Electrophysiologist:  None   Chief Complaint    Clifford Morse is a 59 y.o. male with a hx of MI/CAD (s/p DES to mid LAD 03/2013), HTN, HLD, tobacco use, COPD presents today for follow-up after Startup  Past Medical History    Past Medical History:  Diagnosis Date  . Allergy   . Arthritis   . Asthma   . Chest pain on exertion   . Diabetes mellitus, type 2 ([place])   . GERD (gastroesophageal reflux disease)   . Hyperlipidemia   . Myocardial infarction ([place]) 2014  . Vertigo    with sinus issues   Past Surgical History:  Procedure Laterality Date  . CARDIAC CATHETERIZATION  2014   stent  . ESOPHAGOGASTRODUODENOSCOPY    . HERNIA REPAIR      Allergies  No Known Allergies  History of Present Illness    Clifford Morse is a 59 y.o. male with a hx of MI/CAD (s/p DES to mid LAD 03/2013), HTN, HLD, tobacco use, COPD  last seen by Dr. Garen Morse 05/09/20.  He was seen in consult by Dr. Garen Morse 05/09/20 due to exertional chest tightness and exertional dyspnea. Previous cardiac history of MI in 2014 treated with DES to LAD. He was recommended for Lexiscan and echocardiogram. He was recommended to start Fenofibrate due to hypertriglyceridemia.  Lexiscan 05/19/20 showed medium defect of moderate severity present in mid anterior and apical location deemed intermediate risk. Attenuation correction images show partially reversible mid to distal anterior wall infarct suggestive of LAD ischemia.   He presents today for follow up.  We reviewed his Lexiscan results in detail. He endorses continued dyspnea on exertion.  He denies chest pain, pressure, tightness since last seen.  Tells me he is tolerating the addition of fenofibrate without difficulty.  EKGs/Labs/Other Studies Reviewed:   The following studies were  reviewed today:  Lexiscan 05/2020  There was no ST segment deviation noted during stress.  No T wave inversion was noted during stress.  Defect 1: There is a medium defect of moderate severity present in the mid anterior and apical anterior location.  This is an intermediate risk study.  None attenuation correction images show fixed inferior wall defect consistent with diaphragm attenuation. Attenuation correction images show partially reversible mid to distal anterior wall infarct suggestive of LAD ischemia.  CT attenuation images show LAD calcification and likely prior stent.  This is an inconclusive study. Correlate clinically.   EKG:  No EKG today.  EKG independently reviewed from 05/09/2020 demonstrated NSR 67 bpm with no acute ST/T wave changes.  Recent Labs: 04/18/2020: ALT 47; BUN 14; Creat 0.81; Hemoglobin 15.4; Platelets 243; Potassium 4.1; Sodium 133; TSH 1.14  Recent Lipid Panel    Component Value Date/Time   CHOL 172 04/18/2020 0810   CHOL 163 06/23/2015 0933   CHOL 209 (H) 02/28/2013 0324   TRIG 404 (H) 04/18/2020 0810   TRIG 247 (H) 02/28/2013 0324   HDL 38 (L) 04/18/2020 0810   HDL 41 06/23/2015 0933   HDL 38 (L) 02/28/2013 0324   CHOLHDL 4.5 04/18/2020 0810   VLDL 43 (H) 08/26/2016 0001   VLDL 49 (H) 02/28/2013 0324   LDLCALC  04/18/2020 0810     Comment:     . LDL cholesterol not calculated. Triglyceride levels greater than 400 mg/dL  invalidate calculated LDL results. . Reference range: <100 . Desirable range <100 mg/dL for primary prevention;   <70 mg/dL for patients with CHD or diabetic patients  with > or = 2 CHD risk factors. Clifford Morse LDL-C is now calculated using the Martin-Hopkins  calculation, which is a validated novel method providing  better accuracy than the Friedewald equation in the  estimation of LDL-C.  Clifford Morse et al. Clifford Morse. 8527;782(42): 2061-2068  (http://education.[employer].com/faq/FAQ164)    LDLCALC 122 (H) 02/28/2013 0324    Home Medications   Current Meds  Medication Sig  . aspirin 81 MG tablet Take 81 mg by mouth daily.  . carvedilol (COREG) 25 MG tablet TAKE 1 TABLET BY MOUTH TWICE DAILY WITH A MEAL  . clopidogrel (PLAVIX) 75 MG tablet Take 1 tablet by mouth once daily  . DULoxetine (CYMBALTA) 60 MG capsule Take 1 capsule (60 mg total) by mouth daily.  . fenofibrate (TRICOR) 145 MG tablet Take 1 tablet (145 mg total) by mouth daily.  Clifford Morse lisinopril-hydrochlorothiazide (ZESTORETIC) 10-12.5 MG tablet Take 1 tablet by mouth once daily  . lovastatin (MEVACOR) 40 MG tablet Take 1 tablet (40 mg total) by mouth daily.  . metFORMIN (GLUCOPHAGE XR) 750 MG 24 hr tablet Take 2 tablets (1,500 mg total) by mouth daily with breakfast.  . Omega 3 1000 MG CAPS Take 1 capsule (1,000 mg total) by mouth 2 (two) times daily before a meal.  . pantoprazole (PROTONIX) 40 MG tablet Take 1 tablet (40 mg total) by mouth daily before breakfast.  . traZODone (DESYREL) 100 MG tablet Take 1 tablet (100 mg total) by mouth at bedtime.     Review of Systems  All other systems reviewed and are otherwise negative except as noted above.  Physical Exam    VS:  BP 132/80 (BP Location: Left Arm, Patient Position: Sitting, Cuff Size: Normal)   Pulse 86   Ht 5\' 9"  (1.753 m)   Wt 175 lb (79.4 kg)   SpO2 97%   BMI 25.84 kg/m  , BMI Body mass index is 25.84 kg/m.  Wt Readings from Last 3 Encounters:  05/22/20 175 lb (79.4 kg)  05/09/20 177 lb 6 oz (80.5 kg)  04/25/20 180 lb 9.6 oz (81.9 kg)    GEN: Well nourished, well developed, in no acute distress. HEENT: normal. Neck: Supple, no JVD, carotid bruits, or masses. Cardiac: RRR, no murmurs, rubs, or gallops. No clubbing, cyanosis, edema.  Radials/PT 2+ and equal bilaterally.  Respiratory:  Respirations regular and unlabored, clear to auscultation bilaterally. GI: Soft, nontender, nondistended. MS: No deformity or atrophy. Skin: Warm and dry, no rash. Neuro:  Strength and sensation  are intact. Psych: Normal affect.  Assessment & Plan    1. CAD / Abnormal Lexiscan -previous DES to LAD in 2014.  Lexiscan Myoview 05/20/2020 with medium defect of moderate severity in mid anterior and apical location DDD medial rest.  Attenuation correction images with partially reversible mid to distal anterior wall infarct suggestive of LAD ischemia.  Discussed indication for cardiac catheterization.  He also has echocardiogram upcoming. The risks [stroke (1 in 1000), death (1 in 60), kidney failure [usually temporary] (1 in 500), bleeding (1 in 200), allergic reaction [possibly serious] (1 in 200)], benefits (diagnostic support and management of coronary artery disease) and alternatives of a cardiac catheterization were discussed in detail with Mr. Schnabel and he is willing to proceed. Present GDMT includes DAPT Aspirin/Plavix, Coreg, Lovastatin.  CBC, BMP today.  2. HLD /hypertriglyceridemia -continue lovastatin 40  mg daily and fenofibrate 145 mg daily.  Due for repeat lipid panel for recheck of triglycerides in late December.    3. DM2 -continue to follow with primary care provider.  A1c goal less than 7.  Most recent A1c 04/18/2020 920.  Future considerations include addition of SGLT2 I for cardioprotective benefit.  4. HTN - BP well controlled. Continue current antihypertensive regimen.   5. Tobacco use - Smoking cessation encouraged. Recommend utilization of [id number].  Disposition: Follow up 1-2 weeks after cardiac cath with Dr. Garen Morse or APP.    Signed, Loel Dubonnet, NP 05/22/2020, 4:26 PM [facility]

## 2020-05-22 NOTE — Progress Notes (Signed)
Office Visit    Patient Name: Clifford Morse Date of Encounter: 05/22/2020  Primary Care Provider:  Olin Hauser, DO Primary Cardiologist:  Kate Sable, MD Electrophysiologist:  None   Chief Complaint    Clifford Morse is a 59 y.o. male with a hx of MI/CAD (s/p DES to mid LAD 03/2013), HTN, HLD, tobacco use, COPD presents today for follow-up after [place]  Past Medical History    Past Medical History:  Diagnosis Date  . Allergy   . Arthritis   . Asthma   . Chest pain on exertion   . Diabetes mellitus, type 2 ([place])   . GERD (gastroesophageal reflux disease)   . Hyperlipidemia   . Myocardial infarction ([place]) 2014  . Vertigo    with sinus issues   Past Surgical History:  Procedure Laterality Date  . CARDIAC CATHETERIZATION  2014   stent  . ESOPHAGOGASTRODUODENOSCOPY    . HERNIA REPAIR      Allergies  No Known Allergies  History of Present Illness    Clifford Morse is a 59 y.o. male with a hx of MI/CAD (s/p DES to mid LAD 03/2013), HTN, HLD, tobacco use, COPD  last seen by Dr. Garen Lah 05/09/20.  He was seen in consult by Dr. Garen Lah 05/09/20 due to exertional chest tightness and exertional dyspnea. Previous cardiac history of MI in 2014 treated with DES to LAD. He was recommended for Lexiscan and echocardiogram. He was recommended to start Fenofibrate due to hypertriglyceridemia.  Lexiscan 05/19/20 showed medium defect of moderate severity present in mid anterior and apical location deemed intermediate risk. Attenuation correction images show partially reversible mid to distal anterior wall infarct suggestive of LAD ischemia.   He presents today for follow up.  We reviewed his Lexiscan results in detail. He endorses continued dyspnea on exertion.  He denies chest pain, pressure, tightness since last seen.  Tells me he is tolerating the addition of fenofibrate without difficulty.  EKGs/Labs/Other Studies Reviewed:   The following studies were  reviewed today:  Lexiscan 05/2020  There was no ST segment deviation noted during stress.  No T wave inversion was noted during stress.  Defect 1: There is a medium defect of moderate severity present in the mid anterior and apical anterior location.  This is an intermediate risk study.  None attenuation correction images show fixed inferior wall defect consistent with diaphragm attenuation. Attenuation correction images show partially reversible mid to distal anterior wall infarct suggestive of LAD ischemia.  CT attenuation images show LAD calcification and likely prior stent.  This is an inconclusive study. Correlate clinically.   EKG:  No EKG today.  EKG independently reviewed from 05/09/2020 demonstrated NSR 67 bpm with no acute ST/T wave changes.  Recent Labs: 04/18/2020: ALT 47; BUN 14; Creat 0.81; Hemoglobin 15.4; Platelets 243; Potassium 4.1; Sodium 133; TSH 1.14  Recent Lipid Panel    Component Value Date/Time   CHOL 172 04/18/2020 0810   CHOL 163 06/23/2015 0933   CHOL 209 (H) 02/28/2013 0324   TRIG 404 (H) 04/18/2020 0810   TRIG 247 (H) 02/28/2013 0324   HDL 38 (L) 04/18/2020 0810   HDL 41 06/23/2015 0933   HDL 38 (L) 02/28/2013 0324   CHOLHDL 4.5 04/18/2020 0810   VLDL 43 (H) 08/26/2016 0001   VLDL 49 (H) 02/28/2013 0324   LDLCALC  04/18/2020 0810     Comment:     . LDL cholesterol not calculated. Triglyceride levels greater than 400 mg/dL  invalidate calculated LDL results. . Reference range: <100 . Desirable range <100 mg/dL for primary prevention;   <70 mg/dL for patients with CHD or diabetic patients  with > or = 2 CHD risk factors. Marland Kitchen LDL-C is now calculated using the Martin-Hopkins  calculation, which is a validated novel method providing  better accuracy than the Friedewald equation in the  estimation of LDL-C.  Cresenciano Genre et al. Annamaria Helling. 9326;712(45): 2061-2068  (http://education.Quest[employer].com/faq/FAQ164)    LDLCALC 122 (H) 02/28/2013 0324    Home Medications   Current Meds  Medication Sig  . aspirin 81 MG tablet Take 81 mg by mouth daily.  . carvedilol (COREG) 25 MG tablet TAKE 1 TABLET BY MOUTH TWICE DAILY WITH A MEAL  . clopidogrel (PLAVIX) 75 MG tablet Take 1 tablet by mouth once daily  . DULoxetine (CYMBALTA) 60 MG capsule Take 1 capsule (60 mg total) by mouth daily.  . fenofibrate (TRICOR) 145 MG tablet Take 1 tablet (145 mg total) by mouth daily.  Marland Kitchen lisinopril-hydrochlorothiazide (ZESTORETIC) 10-12.5 MG tablet Take 1 tablet by mouth once daily  . lovastatin (MEVACOR) 40 MG tablet Take 1 tablet (40 mg total) by mouth daily.  . metFORMIN (GLUCOPHAGE XR) 750 MG 24 hr tablet Take 2 tablets (1,500 mg total) by mouth daily with breakfast.  . Omega 3 1000 MG CAPS Take 1 capsule (1,000 mg total) by mouth 2 (two) times daily before a meal.  . pantoprazole (PROTONIX) 40 MG tablet Take 1 tablet (40 mg total) by mouth daily before breakfast.  . traZODone (DESYREL) 100 MG tablet Take 1 tablet (100 mg total) by mouth at bedtime.     Review of Systems  All other systems reviewed and are otherwise negative except as noted above.  Physical Exam    VS:  BP 132/80 (BP Location: Left Arm, Patient Position: Sitting, Cuff Size: Normal)   Pulse 86   Ht 5\' 9"  (1.753 m)   Wt 175 lb (79.4 kg)   SpO2 97%   BMI 25.84 kg/m  , BMI Body mass index is 25.84 kg/m.  Wt Readings from Last 3 Encounters:  05/22/20 175 lb (79.4 kg)  05/09/20 177 lb 6 oz (80.5 kg)  04/25/20 180 lb 9.6 oz (81.9 kg)    GEN: Well nourished, well developed, in no acute distress. HEENT: normal. Neck: Supple, no JVD, carotid bruits, or masses. Cardiac: RRR, no murmurs, rubs, or gallops. No clubbing, cyanosis, edema.  Radials/PT 2+ and equal bilaterally.  Respiratory:  Respirations regular and unlabored, clear to auscultation bilaterally. GI: Soft, nontender, nondistended. MS: No deformity or atrophy. Skin: Warm and dry, no rash. Neuro:  Strength and sensation  are intact. Psych: Normal affect.  Assessment & Plan    1. CAD / Abnormal Lexiscan -previous DES to LAD in 2014.  Lexiscan Myoview 05/20/2020 with medium defect of moderate severity in mid anterior and apical location DDD medial rest.  Attenuation correction images with partially reversible mid to distal anterior wall infarct suggestive of LAD ischemia.  Discussed indication for cardiac catheterization.  He also has echocardiogram upcoming. The risks [stroke (1 in 1000), death (1 in 79), kidney failure [usually temporary] (1 in 500), bleeding (1 in 200), allergic reaction [possibly serious] (1 in 200)], benefits (diagnostic support and management of coronary artery disease) and alternatives of a cardiac catheterization were discussed in detail with Mr. Varelas and he is willing to proceed. Present GDMT includes DAPT Aspirin/Plavix, Coreg, Lovastatin.  CBC, BMP today.  2. HLD /hypertriglyceridemia -continue lovastatin 40  mg daily and fenofibrate 145 mg daily.  Due for repeat lipid panel for recheck of triglycerides in late December.    3. DM2 -continue to follow with primary care provider.  A1c goal less than 7.  Most recent A1c 04/18/2020 920.  Future considerations include addition of SGLT2 I for cardioprotective benefit.  4. HTN - BP well controlled. Continue current antihypertensive regimen.   5. Tobacco use - Smoking cessation encouraged. Recommend utilization of 1800QUITNOW.  Disposition: Follow up 1-2 weeks after cardiac cath with Dr. Garen Lah or APP.    Signed, Loel Dubonnet, NP 05/22/2020, 4:26 PM [facility]

## 2020-05-22 NOTE — Telephone Encounter (Signed)
Called [facility] cath scheduling. Lmom. Awaiting scheduling. Requested that the patient be scheduled for a LHC on 06/05/20 @ 10:30am with Dr. Fletcher Anon. Dx CAD, abnomal stress test. Requested a call back to confirm.

## 2020-05-22 NOTE — Telephone Encounter (Signed)
Patient calling back to schedule his heart cath. Patient would like to do 12/6 if possible

## 2020-05-22 NOTE — Telephone Encounter (Signed)
Patients LHC scheduled for 06/05/20 @ 10:30am with Dr. Fletcher Anon Patient adv to arrive at 9:30am at the [facility] medical mall.  He will have his COVID test at the medical arts drive up test site on 06/02/20 between 8am-1pm.  Patient was seen in the office today and given his written pre-procedure instructions. He has no questions or concerns at this time.  His 06/08/20 appt with Dr. Garen Lah has been rescheduled after his cath on 06/16/20 @ 11:40am.  Patient verbalized understanding and voiced appreciation for the call back.

## 2020-05-22 NOTE — Patient Instructions (Addendum)
Medication Instructions:  No medicine changes today.   *If you need a refill on your cardiac medications before your next appointment, please call your pharmacy*   Lab Work: Your provider recommends that you return for lab work today: CBC, BMP  You will need a COVID test 2 days prior to the procedure. You will report to the [facility] medical arts building. Their hours are Mon-Fri 8am-1pm.  If you have labs (blood work) drawn today and your tests are completely normal, you will receive your results only by: Marland Kitchen MyChart Message (if you have MyChart) OR . A paper copy in the mail If you have any lab test that is abnormal or we need to change your treatment, we will call you to review the results.  Testing/Procedures: Your stress test showed a possible blockage - your provider has recommended you have a cardiac catheterization for further investigation.  Follow-Up: At [facility], you and your health needs are our priority.  As part of our continuing mission to provide you with exceptional heart care, we have created designated Provider Care Teams.  These Care Teams include your primary Cardiologist (physician) and Advanced Practice Providers (APPs -  Physician Assistants and Nurse Practitioners) who all work together to provide you with the care you need, when you need it.  We recommend signing up for the patient portal called "MyChart".  Sign up information is provided on this After Visit Summary.  MyChart is used to connect with patients for Virtual Visits (Telemedicine).  Patients are able to view lab/test results, encounter notes, upcoming appointments, etc.  Non-urgent messages can be sent to your provider as well.   To learn more about what you can do with MyChart, go to [email or web address].    Your next appointment:   2 weeks after cardiac catheterization  The format for your next appointment:   In Person  Provider:   You may see Kate Sable, MD or one of the following  Advanced Practice Providers on your designated Care Team:    Murray Hodgkins, NP  Christell Faith, PA-C  Marrianne Mood, PA-C  Cadence Kathlen Mody, [place]  Laurann Montana, NP    Other Instructions   Please call our office when you are ready to schedule [place] are scheduled for a Cardiac Cath on:_________________________  Please arrive at _______am on the day of your procedure  Please expect a call from our [place] to pre-register you  Do not eat/drink anything after midnight  Someone will need to drive you home  It is recommended someone be with you for the first 24 hours after your procedure  Wear clothes that are easy to get on/off and wear slip on shoes if possible   Medications bring a current list of all medications with you   _X__ Do not take these medications before your procedure:  -Metformin the day of the procedure and 48 hours after.  Day of your procedure: Arrive at the [facility] entrance.  Free valet service is available.  After entering the [place] please check-in at the registration desk (1st desk on your right) to receive your armband. After receiving your armband someone will escort you to the cardiac cath/special procedures waiting area.  The usual length of stay after your procedure is about 2 to 3 hours.  This can vary.  If you have any questions, please call our office at [phone], or you may call the cardiac cath lab at [facility] directly  at [phone]

## 2020-05-23 LAB — CBC WITH DIFFERENTIAL/PLATELET
Basophils Absolute: 0.1 10*3/uL (ref 0.0–0.2)
Basos: 1 %
EOS (ABSOLUTE): 0.2 10*3/uL (ref 0.0–0.4)
Eos: 3 %
Hematocrit: 41.4 % (ref 37.5–51.0)
Hemoglobin: 14.7 g/dL (ref 13.0–17.7)
Immature Grans (Abs): 0 10*3/uL (ref 0.0–0.1)
Immature Granulocytes: 0 %
Lymphocytes Absolute: 3.4 10*3/uL — ABNORMAL HIGH (ref 0.7–3.1)
Lymphs: 36 %
MCH: 31.3 pg (ref 26.6–33.0)
MCHC: 35.5 g/dL (ref 31.5–35.7)
MCV: 88 fL (ref 79–97)
Monocytes Absolute: 0.6 10*3/uL (ref 0.1–0.9)
Monocytes: 6 %
Neutrophils Absolute: 5 10*3/uL (ref 1.4–7.0)
Neutrophils: 54 %
Platelets: 320 10*3/uL (ref 150–450)
RBC: 4.69 x10E6/uL (ref 4.14–5.80)
RDW: 11.7 % (ref 11.6–15.4)
WBC: 9.3 10*3/uL (ref 3.4–10.8)

## 2020-05-23 LAB — BASIC METABOLIC PANEL
BUN/Creatinine Ratio: 18 (ref 10–24)
BUN: 13 mg/dL (ref 8–27)
CO2: 21 mmol/L (ref 20–29)
Calcium: 9.4 mg/dL (ref 8.6–10.2)
Chloride: 100 mmol/L (ref 96–106)
Creatinine, Ser: 0.71 mg/dL — ABNORMAL LOW (ref 0.76–1.27)
GFR calc Af Amer: 118 mL/min/{1.73_m2} (ref >59)
GFR calc non Af Amer: 102 mL/min/{1.73_m2} (ref >59)
Glucose: 120 mg/dL — ABNORMAL HIGH (ref 65–99)
Potassium: 4.1 mmol/L (ref 3.5–5.2)
Sodium: 139 mmol/L (ref 134–144)

## 2020-05-24 ENCOUNTER — Ambulatory Visit (INDEPENDENT_AMBULATORY_CARE_PROVIDER_SITE_OTHER): Payer: 59

## 2020-05-24 ENCOUNTER — Other Ambulatory Visit: Payer: Self-pay

## 2020-05-24 DIAGNOSIS — I2510 Atherosclerotic heart disease of native coronary artery without angina pectoris: Secondary | ICD-10-CM

## 2020-05-24 LAB — ECHOCARDIOGRAM COMPLETE
Area-P 1/2: 3.42 cm2
Calc EF: 62.3 %
S' Lateral: 3.4 cm
Single Plane A2C EF: 65.7 %
Single Plane A4C EF: 58 %

## 2020-05-29 ENCOUNTER — Telehealth: Payer: Self-pay | Admitting: Cardiovascular Disease

## 2020-05-29 ENCOUNTER — Telehealth: Payer: Self-pay | Admitting: Cardiology

## 2020-05-29 NOTE — Telephone Encounter (Signed)
Patient wants to reschedule upcoming Cath   Please call.

## 2020-05-29 NOTE — Telephone Encounter (Signed)
Patient would like to reschedule his cath for 12/20. Please call to discuss

## 2020-05-30 NOTE — Telephone Encounter (Signed)
This has already been done. There was a duplicate encounter.

## 2020-05-30 NOTE — Telephone Encounter (Signed)
This is actually a Dr. Garen Lah pt.

## 2020-05-30 NOTE — Telephone Encounter (Signed)
Pt wants to reschedule procedure.

## 2020-05-30 NOTE — Telephone Encounter (Signed)
Spoke to patient and informed him that his Cath Lab procedure has been rescheduled for 06/19/20 at 1030. Patient aware that he needs to be there at 0930.  Patient was grateful for the accommodations.

## 2020-05-30 NOTE — Telephone Encounter (Signed)
Patient calling back in to reschedule cath to 12/20   Please advise

## 2020-06-02 ENCOUNTER — Other Ambulatory Visit: Payer: 59 | Attending: Family

## 2020-06-05 DIAGNOSIS — R9439 Abnormal result of other cardiovascular function study: Secondary | ICD-10-CM

## 2020-06-05 DIAGNOSIS — I2510 Atherosclerotic heart disease of native coronary artery without angina pectoris: Secondary | ICD-10-CM

## 2020-06-06 ENCOUNTER — Other Ambulatory Visit: Payer: Self-pay

## 2020-06-06 ENCOUNTER — Encounter: Payer: Self-pay | Admitting: Family Medicine

## 2020-06-06 ENCOUNTER — Ambulatory Visit
Admission: RE | Admit: 2020-06-06 | Discharge: 2020-06-06 | Disposition: A | Discharge status: Home / Self Care | Payer: 59 | Source: Ambulatory Visit | Attending: Family Medicine | Admitting: Family Medicine

## 2020-06-06 ENCOUNTER — Ambulatory Visit
Admission: RE | Admit: 2020-06-06 | Discharge: 2020-06-06 | Disposition: A | Discharge status: Home / Self Care | Payer: 59 | Attending: Family Medicine | Admitting: Family Medicine

## 2020-06-06 ENCOUNTER — Ambulatory Visit (INDEPENDENT_AMBULATORY_CARE_PROVIDER_SITE_OTHER): Payer: 59 | Admitting: Family Medicine

## 2020-06-06 VITALS — BP 126/65 | HR 85 | Temp 97.8°F | Resp 16 | Ht 69.0 in | Wt 174.0 lb

## 2020-06-06 DIAGNOSIS — M25562 Pain in left knee: Secondary | ICD-10-CM

## 2020-06-06 DIAGNOSIS — M25561 Pain in right knee: Secondary | ICD-10-CM | POA: Insufficient documentation

## 2020-06-06 DIAGNOSIS — G8929 Other chronic pain: Secondary | ICD-10-CM | POA: Diagnosis present

## 2020-06-06 DIAGNOSIS — M25551 Pain in right hip: Secondary | ICD-10-CM | POA: Insufficient documentation

## 2020-06-06 DIAGNOSIS — M7918 Myalgia, other site: Secondary | ICD-10-CM

## 2020-06-06 DIAGNOSIS — M25552 Pain in left hip: Secondary | ICD-10-CM | POA: Diagnosis present

## 2020-06-06 DIAGNOSIS — E1169 Type 2 diabetes mellitus with other specified complication: Secondary | ICD-10-CM

## 2020-06-06 DIAGNOSIS — G894 Chronic pain syndrome: Secondary | ICD-10-CM

## 2020-06-06 NOTE — Progress Notes (Signed)
Subjective:    Patient ID: Clifford Morse, male    DOB: 1960-12-29, 59 y.o.   MRN: [id number]  Clifford Morse is a 59 y.o. male presenting on 06/06/2020 for Diabetes (6 week follow up)   HPI   DM, Type 2 / Possible neuropathy complication Last V6H up to 9.8 elevated, non adherence with med on metformin PM dosing, last visit 03/2020 we switched to Metformin XR 750 x 2 in AM - and he has adhered better to this, also gave Trulicity pen sample, but he has not started this yet, wants to see if he can do without it. CBGs:AM Fasting sugar avg 100-120. Not checking other times. Meds:MetforminXR 750mg  x 2 = 1500mg  daily in AM Currently on ACEialready Lifestyle: - Diet (still improving low carb but not always) - Exercise (no regular exercise Last DM Eye exam 10/2018 showed retinopathy, Dr Ellin Mayhew, has not been back. History of occasional foot stinging symptoms  Chronic Pain Syndrome Chronic Hip / Knee pain Reports he has had chronic pain for long time. He has had worsening difficulty adapting to generalized pain, especially in both hips and knees. He has been on Duloxetine in past without results. He takes occasional OTC Ibuprofen, not consecutive days Never really managed by prior doctors. Has had prior x-ray knees 2014 unremarkable He has not had hip x-rays Never on Gabapentin regularly before.  Denies hypoglycemia, polyuria, visual changes, numbness or tingling.    Depression screen [facility] 2/9 04/26/2020 02/14/2020 04/21/2019  Decreased Interest 2 3 1   Down, Depressed, Hopeless 2 2 1   PHQ - 2 Score 4 5 2   Altered sleeping 1 1 0  Tired, decreased energy 3 3 2   Change in appetite 1 1 0  Feeling bad or failure about yourself  1 1 0  Trouble concentrating 1 1 0  Moving slowly or fidgety/restless 0 0 0  Suicidal thoughts 0 1 0  PHQ-9 Score 11 13 4   Difficult doing work/chores Not difficult at all Somewhat difficult Not difficult at all  Some recent data might be hidden    Social  History   Tobacco Use  . Smoking status: Current Every Day Smoker    Packs/day: 1.50    Years: 25.00    Pack years: 37.50    Types: Cigarettes  . Smokeless tobacco: Former Systems developer  . Tobacco comment: since approx age 23  Vaping Use  . Vaping Use: Never used  Substance Use Topics  . Alcohol use: Yes    Alcohol/week: 0.0 standard drinks    Comment: rare  . Drug use: No    Review of Systems Per HPI unless specifically indicated above     Objective:    BP 126/65   Pulse 85   Temp 97.8 F (36.6 C) (Temporal)   Resp 16   Ht 5\' 9"  (1.753 m)   Wt 174 lb (78.9 kg)   SpO2 98%   BMI 25.70 kg/m   Wt Readings from Last 3 Encounters:  06/06/20 174 lb (78.9 kg)  05/22/20 175 lb (79.4 kg)  05/09/20 177 lb 6 oz (80.5 kg)    Physical Exam Vitals and nursing note reviewed.  Constitutional:      General: He is not in acute distress.    Appearance: He is well-developed. He is not diaphoretic.     Comments: Well-appearing, comfortable, cooperative  HENT:     Head: Normocephalic and atraumatic.  Eyes:     General:        Right  eye: No discharge.        Left eye: No discharge.     Conjunctiva/sclera: Conjunctivae normal.  Cardiovascular:     Rate and Rhythm: Normal rate.  Pulmonary:     Effort: Pulmonary effort is normal.  Musculoskeletal:     Comments: Low Back / bilateral hip and Greater Trochanter Inspection: BACK - Normal appearance, no spinal deformity, symmetrical. HIP - Normal appearance, symmetrical, no obvious leg length or pelvis deformity  Palpation: BACK - No tenderness over spinous processes. Bilateral lumbar paraspinal muscles non-tender and without hypertonicity/spasm. HIP - Mild tender to palpation deeper bilateral greater trochanter region of lateral upper thigh. Lower extremity thigh calf soft non tender no spasm.  ROM: BACK - Full active ROM forward flex / back extension, rotation L/R without discomfort HIP - Bilateral hip internal rotation was limited and  caused pain with some stiffness. Has intact hip flexion and extension.  Special Testing: BACK - Seated SLR negative for radicular pain bilaterally HIP - FADIR with pain.  Strength: Bilateral hip flex/ext 5/5, knee flex/ext 5/5, ankle dorsiflex/plantarflex 5/5 Neurovascular: intact distal sensation to light touch  Bilateral Knees Inspection: Normal appearance and symmetrical. No ecchymosis or effusion. Left knee has bony protuberance at tibial plateau non tender Palpation: Non-tender. Joint lines Mild crepitus ROM: Full active ROM bilaterally Special Testing: Lachman / Valgus/Varus tests negative with intact ligaments (ACL, MCL, LCL). McMurray negative without meniscus symptoms. Strength: 5/5 intact knee flex/ext, ankle dorsi/plantarflex Neurovascular: distally intact sensation light touch and pulses   Skin:    General: Skin is warm and dry.     Findings: No erythema or rash.  Neurological:     Mental Status: He is alert and oriented to person, place, and time.  Psychiatric:        Behavior: Behavior normal.     Comments: Well groomed, good eye contact, normal speech and thoughts    Results for orders placed or performed in visit on 05/24/20  ECHOCARDIOGRAM COMPLETE  Result Value Ref Range   S' Lateral 3.40 cm   Area-P 1/2 3.42 cm2   Single Plane A4C EF 58.0 %   Single Plane A2C EF 65.7 %   Calc EF 62.3 %      Assessment & Plan:   Problem List Items Addressed This Visit    Type 2 diabetes mellitus with other specified complication ([place]) - Primary    Not due yet for A1c Last high at 9.8 Now improved adherence Complications - Hyperlipidemia and CAD, Retinopathy Strong fam history of DM  Encouraged by improved CBG  Plan:  1. Continue Metformin XR 750mg  x 2 = 1500mg  daily - HOLD Trulicity sample, has not started yet 2. Continue ASA, ACEi, Statin      Chronic pain syndrome   Chronic myofascial pain    Other Visit Diagnoses    Chronic hip pain, bilateral        Relevant Orders   DG HIP UNILAT W OR W/O PELVIS 2-3 VIEWS RIGHT   Bilateral chronic knee pain       Relevant Orders   DG Knee Complete 4 Views Right       #Chronic myofascial vs Joint Pain History chronic pain syndrome Poorly diagnosed in past. No recent imaging, last done in 2014 with knee imaging without remarkable findings. Uncertain if patient has osteoarthritis of joints, but given history of pain it is highly suggestive. - Has been treated for myofascial pain with Duloxetine in past. Seems to have mixed or limited  results. - Not on other chronic pain medication. Prefers to avoid opiates or stronger therapy.  Order X-rays today R Hip and R Knee.   No orders of the defined types were placed in this encounter.     Follow up plan: Return in about 3 months (around 09/04/2020) for 3 month DM A1c, Knee/Hip Chronic Pain.   Nobie Putnam, [facility] 06/06/2020, 4:01 PM

## 2020-06-06 NOTE — Assessment & Plan Note (Signed)
Not due yet for A1c Last high at 9.8 Now improved adherence Complications - Hyperlipidemia and CAD, Retinopathy Strong fam history of DM  Encouraged by improved CBG  Plan:  1. Continue Metformin XR 750mg  x 2 = 1500mg  daily - HOLD Trulicity sample, has not started yet 2. Continue ASA, ACEi, Statin

## 2020-06-06 NOTE — Patient Instructions (Addendum)
Thank you for coming to the office today.  X-rays today of Hip and Knee.  I would recommend Gabapentin or Muscle relaxant (baclofen) as option to treat muscle pain if not improving and the x-rays do not show any significant sign of arthritis wear and tear of joints.  We can use the gabapentin if needed.  Start Gabapentin 100mg  capsules, take at night for 2-3 nights only, and then increase to 2 times a day for a few days, and then may increase to 3 times a day, it may make you drowsy, if helps significantly at night only, then you can increase instead to 3 capsules at night, instead of 3 times a day - In the future if needed, we can significantly increase the dose if tolerated well, some common doses are 300mg  three times a day up to 600mg  three times a day, usually it takes several weeks or months to get to higher doses   Sounds like sugar is improved.  Keep on Metformin XR twice in morning  HOLD off on Trulicity for now.  A1C eAG % mg/dl mmol/l 6 126 7.0 6.5 140 7.8 7 154 8.6 7.5 169 9.4 8 183 10.1 8.5 197 10.9 9 212 11.8 9.5 226 12.6 10 240 13.4   Recommend Yearly Diabetic Eye Exam, have them send [place] a copy.  [facility]   Address: [address] [place], [place] [address] Phone: [phone]  Website: [email or web address]   Please schedule a Follow-up Appointment to: Return in about 3 months (around 09/04/2020) for 3 month DM A1c, Knee/Hip Chronic Pain.  If you have any other questions or concerns, please feel free to call the office or send a message through [place]. You may also schedule an earlier appointment if necessary.  Additionally, you may be receiving a survey about your experience at our office within a few days to 1 week by e-mail or mail. We value your feedback.  Clifford Putnam, DO [place]

## 2020-06-07 MED ORDER — GABAPENTIN 100 MG PO CAPS: ORAL_CAPSULE | ORAL | 1 refills | Status: DC

## 2020-06-07 NOTE — Addendum Note (Signed)
Addended by: Olin Hauser on: 06/07/2020 05:56 PM   Modules accepted: Orders

## 2020-06-08 ENCOUNTER — Ambulatory Visit: Payer: 59 | Admitting: Cardiology

## 2020-06-13 ENCOUNTER — Other Ambulatory Visit: Payer: Self-pay | Admitting: Family Medicine

## 2020-06-13 DIAGNOSIS — I2510 Atherosclerotic heart disease of native coronary artery without angina pectoris: Secondary | ICD-10-CM

## 2020-06-13 DIAGNOSIS — I10 Essential (primary) hypertension: Secondary | ICD-10-CM

## 2020-06-15 ENCOUNTER — Other Ambulatory Visit
Admission: RE | Admit: 2020-06-15 | Discharge: 2020-06-15 | Disposition: A | Discharge status: Home / Self Care | Payer: 59 | Source: Ambulatory Visit | Attending: Cardiovascular Disease | Admitting: Cardiovascular Disease

## 2020-06-15 ENCOUNTER — Other Ambulatory Visit: Payer: Self-pay

## 2020-06-15 DIAGNOSIS — Z20822 Contact with and (suspected) exposure to covid-19: Secondary | ICD-10-CM | POA: Diagnosis not present

## 2020-06-15 DIAGNOSIS — Z01812 Encounter for preprocedural laboratory examination: Secondary | ICD-10-CM | POA: Insufficient documentation

## 2020-06-15 LAB — SARS CORONAVIRUS 2 (TAT 6-24 HRS): SARS Coronavirus 2: NEGATIVE

## 2020-06-16 ENCOUNTER — Ambulatory Visit: Payer: 59 | Admitting: Cardiology

## 2020-06-19 ENCOUNTER — Other Ambulatory Visit: Payer: Self-pay

## 2020-06-19 ENCOUNTER — Ambulatory Visit
Admission: RE | Admit: 2020-06-19 | Discharge: 2020-06-20 | Disposition: A | Discharge status: Home / Self Care | Payer: 59 | Attending: Cardiovascular Disease | Admitting: Cardiovascular Disease

## 2020-06-19 ENCOUNTER — Encounter
Admission: RE | Disposition: A | Discharge status: Home / Self Care | Payer: Self-pay | Source: Home / Self Care | Attending: Cardiovascular Disease

## 2020-06-19 ENCOUNTER — Ambulatory Visit: Payer: 59 | Admitting: Cardiology

## 2020-06-19 ENCOUNTER — Encounter: Payer: Self-pay | Admitting: Cardiovascular Disease

## 2020-06-19 DIAGNOSIS — E785 Hyperlipidemia, unspecified: Secondary | ICD-10-CM | POA: Insufficient documentation

## 2020-06-19 DIAGNOSIS — Z7902 Long term (current) use of antithrombotics/antiplatelets: Secondary | ICD-10-CM | POA: Diagnosis not present

## 2020-06-19 DIAGNOSIS — Z7982 Long term (current) use of aspirin: Secondary | ICD-10-CM | POA: Diagnosis not present

## 2020-06-19 DIAGNOSIS — I25118 Atherosclerotic heart disease of native coronary artery with other forms of angina pectoris: Secondary | ICD-10-CM | POA: Insufficient documentation

## 2020-06-19 DIAGNOSIS — E781 Pure hyperglyceridemia: Secondary | ICD-10-CM | POA: Diagnosis not present

## 2020-06-19 DIAGNOSIS — Z79899 Other long term (current) drug therapy: Secondary | ICD-10-CM | POA: Insufficient documentation

## 2020-06-19 DIAGNOSIS — I10 Essential (primary) hypertension: Secondary | ICD-10-CM | POA: Diagnosis not present

## 2020-06-19 DIAGNOSIS — I2510 Atherosclerotic heart disease of native coronary artery without angina pectoris: Secondary | ICD-10-CM

## 2020-06-19 DIAGNOSIS — Z7984 Long term (current) use of oral hypoglycemic drugs: Secondary | ICD-10-CM | POA: Insufficient documentation

## 2020-06-19 DIAGNOSIS — Z720 Tobacco use: Secondary | ICD-10-CM | POA: Diagnosis present

## 2020-06-19 DIAGNOSIS — R9439 Abnormal result of other cardiovascular function study: Secondary | ICD-10-CM

## 2020-06-19 DIAGNOSIS — E119 Type 2 diabetes mellitus without complications: Secondary | ICD-10-CM | POA: Insufficient documentation

## 2020-06-19 DIAGNOSIS — R0609 Other forms of dyspnea: Secondary | ICD-10-CM

## 2020-06-19 DIAGNOSIS — I208 Other forms of angina pectoris: Secondary | ICD-10-CM | POA: Diagnosis present

## 2020-06-19 HISTORY — PX: LEFT HEART CATH AND CORONARY ANGIOGRAPHY: CATH118249

## 2020-06-19 HISTORY — PX: CORONARY STENT INTERVENTION: CATH118234

## 2020-06-19 LAB — POCT ACTIVATED CLOTTING TIME: Activated Clotting Time: 470 seconds

## 2020-06-19 LAB — GLUCOSE, CAPILLARY
Glucose-Capillary: 123 mg/dL — ABNORMAL HIGH (ref 70–99)
Glucose-Capillary: 108 mg/dL — ABNORMAL HIGH (ref 70–99)

## 2020-06-19 SURGERY — LEFT HEART CATH AND CORONARY ANGIOGRAPHY
Anesthesia: Moderate Sedation

## 2020-06-19 MED ORDER — FENOFIBRATE 160 MG PO TABS
160.0000 mg | ORAL_TABLET | Freq: Every day | ORAL | Status: DC
  Filled 2020-06-19: qty 1

## 2020-06-19 MED ORDER — INFLUENZA VAC SPLIT QUAD 0.5 ML IM SUSY: 0.5000 mL | PREFILLED_SYRINGE | INTRAMUSCULAR | Status: DC

## 2020-06-19 MED ORDER — HYDROCHLOROTHIAZIDE 12.5 MG PO CAPS
12.5000 mg | ORAL_CAPSULE | Freq: Every day | ORAL | Status: DC
Start: 2020-06-19 — End: 2020-06-20
  Filled 2020-06-19 (×2): qty 1

## 2020-06-19 MED ORDER — HEPARIN SODIUM (PORCINE) 1000 UNIT/ML IJ SOLN
INTRAMUSCULAR | Status: AC
  Filled 2020-06-19: qty 1

## 2020-06-19 MED ORDER — VERAPAMIL HCL 2.5 MG/ML IV SOLN
INTRAVENOUS | Status: DC | PRN
  Administered 2020-06-19: 2.5 mg via INTRAVENOUS

## 2020-06-19 MED ORDER — LISINOPRIL-HYDROCHLOROTHIAZIDE 10-12.5 MG PO TABS: 1.0000 | ORAL_TABLET | Freq: Every day | ORAL | Status: DC

## 2020-06-19 MED ORDER — PANTOPRAZOLE SODIUM 40 MG PO TBEC
40.0000 mg | DELAYED_RELEASE_TABLET | Freq: Every day | ORAL | Status: DC
  Administered 2020-06-20: 09:00:00 40 mg via ORAL
  Filled 2020-06-19 (×2): qty 1

## 2020-06-19 MED ORDER — LIDOCAINE HCL (PF) 1 % IJ SOLN
INTRAMUSCULAR | Status: DC | PRN
  Administered 2020-06-19: 2 mL

## 2020-06-19 MED ORDER — CARVEDILOL 25 MG PO TABS
25.0000 mg | ORAL_TABLET | Freq: Two times a day (BID) | ORAL | Status: DC
  Administered 2020-06-19 – 2020-06-20 (×2): 25 mg via ORAL
  Filled 2020-06-19 (×3): qty 1

## 2020-06-19 MED ORDER — FENTANYL CITRATE (PF) 100 MCG/2ML IJ SOLN
INTRAMUSCULAR | Status: DC | PRN
  Administered 2020-06-19: 25 ug via INTRAVENOUS

## 2020-06-19 MED ORDER — ASPIRIN 81 MG PO CHEW: 81.0000 mg | CHEWABLE_TABLET | ORAL | Status: DC

## 2020-06-19 MED ORDER — TRAZODONE HCL 100 MG PO TABS
100.0000 mg | ORAL_TABLET | Freq: Every day | ORAL | Status: DC
  Administered 2020-06-19: 21:00:00 100 mg via ORAL
  Filled 2020-06-19 (×2): qty 1

## 2020-06-19 MED ORDER — IPRATROPIUM-ALBUTEROL 0.5-2.5 (3) MG/3ML IN SOLN
RESPIRATORY_TRACT | Status: AC
  Filled 2020-06-19: qty 3

## 2020-06-19 MED ORDER — NITROGLYCERIN 1 MG/10 ML FOR IR/CATH LAB
INTRA_ARTERIAL | Status: DC | PRN
  Administered 2020-06-19: 200 ug

## 2020-06-19 MED ORDER — HEPARIN (PORCINE) IN NACL 1000-0.9 UT/500ML-% IV SOLN
INTRAVENOUS | Status: DC | PRN
  Administered 2020-06-19: 500 mL

## 2020-06-19 MED ORDER — CLOPIDOGREL BISULFATE 75 MG PO TABS
ORAL_TABLET | ORAL | Status: AC
  Filled 2020-06-19: qty 4

## 2020-06-19 MED ORDER — FENTANYL CITRATE (PF) 100 MCG/2ML IJ SOLN
INTRAMUSCULAR | Status: AC
  Filled 2020-06-19: qty 2

## 2020-06-19 MED ORDER — SODIUM CHLORIDE 0.9% FLUSH
3.0000 mL | Freq: Two times a day (BID) | INTRAVENOUS | Status: DC
  Administered 2020-06-19: 16:00:00 3 mL via INTRAVENOUS

## 2020-06-19 MED ORDER — SODIUM CHLORIDE 0.9% FLUSH: 3.0000 mL | INTRAVENOUS | Status: DC | PRN

## 2020-06-19 MED ORDER — SODIUM CHLORIDE 0.9 % WEIGHT BASED INFUSION
3.0000 mL/kg/h | INTRAVENOUS | Status: DC
  Administered 2020-06-19: 10:00:00 3 mL/kg/h via INTRAVENOUS

## 2020-06-19 MED ORDER — ASPIRIN EC 81 MG PO TBEC
81.0000 mg | DELAYED_RELEASE_TABLET | Freq: Every day | ORAL | Status: DC
  Administered 2020-06-20: 09:00:00 81 mg via ORAL
  Filled 2020-06-19: qty 1

## 2020-06-19 MED ORDER — CLOPIDOGREL BISULFATE 75 MG PO TABS
75.0000 mg | ORAL_TABLET | Freq: Every day | ORAL | Status: DC
  Administered 2020-06-20: 09:00:00 75 mg via ORAL
  Filled 2020-06-19: qty 1

## 2020-06-19 MED ORDER — CLOPIDOGREL BISULFATE 75 MG PO TABS
ORAL_TABLET | ORAL | Status: DC | PRN
  Administered 2020-06-19: 300 mg via ORAL

## 2020-06-19 MED ORDER — SODIUM CHLORIDE 0.9 % WEIGHT BASED INFUSION: 1.0000 mL/kg/h | INTRAVENOUS | Status: DC

## 2020-06-19 MED ORDER — HEPARIN SODIUM (PORCINE) 1000 UNIT/ML IJ SOLN
INTRAMUSCULAR | Status: DC | PRN
  Administered 2020-06-19 (×2): 4000 [IU] via INTRAVENOUS

## 2020-06-19 MED ORDER — NITROGLYCERIN 1 MG/10 ML FOR IR/CATH LAB
INTRA_ARTERIAL | Status: AC
  Filled 2020-06-19: qty 10

## 2020-06-19 MED ORDER — MIDAZOLAM HCL 2 MG/2ML IJ SOLN
INTRAMUSCULAR | Status: AC
  Filled 2020-06-19: qty 2

## 2020-06-19 MED ORDER — PSEUDOEPHEDRINE-ACETAMINOPHEN 30-500 MG PO TABS: 2.0000 | ORAL_TABLET | Freq: Two times a day (BID) | ORAL | Status: DC | PRN

## 2020-06-19 MED ORDER — LABETALOL HCL 5 MG/ML IV SOLN: 10.0000 mg | INTRAVENOUS | Status: AC | PRN

## 2020-06-19 MED ORDER — DULOXETINE HCL 60 MG PO CPEP
60.0000 mg | ORAL_CAPSULE | Freq: Every day | ORAL | Status: DC
  Administered 2020-06-20: 09:00:00 60 mg via ORAL
  Filled 2020-06-19: qty 2
  Filled 2020-06-19: qty 1

## 2020-06-19 MED ORDER — LIDOCAINE HCL (PF) 1 % IJ SOLN
INTRAMUSCULAR | Status: AC
  Filled 2020-06-19: qty 30

## 2020-06-19 MED ORDER — ACETAMINOPHEN 325 MG PO TABS: 650.0000 mg | ORAL_TABLET | ORAL | Status: DC | PRN

## 2020-06-19 MED ORDER — SODIUM CHLORIDE 0.9 % IV SOLN: 250.0000 mL | INTRAVENOUS | Status: DC | PRN

## 2020-06-19 MED ORDER — SODIUM CHLORIDE 0.9 % WEIGHT BASED INFUSION: 1.0000 mL/kg/h | INTRAVENOUS | Status: AC

## 2020-06-19 MED ORDER — GABAPENTIN 100 MG PO CAPS: 200.0000 mg | ORAL_CAPSULE | Freq: Two times a day (BID) | ORAL | Status: DC

## 2020-06-19 MED ORDER — SODIUM CHLORIDE 0.9% FLUSH
3.0000 mL | Freq: Two times a day (BID) | INTRAVENOUS | Status: DC
  Administered 2020-06-19 – 2020-06-20 (×2): 3 mL via INTRAVENOUS

## 2020-06-19 MED ORDER — IOHEXOL 300 MG/ML  SOLN
INTRAMUSCULAR | Status: DC | PRN
  Administered 2020-06-19: 12:00:00 135 mL

## 2020-06-19 MED ORDER — LISINOPRIL 10 MG PO TABS
10.0000 mg | ORAL_TABLET | Freq: Every day | ORAL | Status: DC
  Administered 2020-06-20: 09:00:00 10 mg via ORAL
  Filled 2020-06-19 (×2): qty 1

## 2020-06-19 MED ORDER — PSEUDOEPHEDRINE HCL ER 120 MG PO TB12
120.0000 mg | ORAL_TABLET | Freq: Two times a day (BID) | ORAL | Status: DC | PRN
  Filled 2020-06-19: qty 1

## 2020-06-19 MED ORDER — VERAPAMIL HCL 2.5 MG/ML IV SOLN
INTRAVENOUS | Status: AC
  Filled 2020-06-19: qty 2

## 2020-06-19 MED ORDER — OMEGA 3 1000 MG PO CAPS: 1000.0000 mg | ORAL_CAPSULE | Freq: Two times a day (BID) | ORAL | Status: DC

## 2020-06-19 MED ORDER — HEPARIN (PORCINE) IN NACL 1000-0.9 UT/500ML-% IV SOLN
INTRAVENOUS | Status: AC
  Filled 2020-06-19: qty 1000

## 2020-06-19 MED ORDER — ONDANSETRON HCL 4 MG/2ML IJ SOLN: 4.0000 mg | Freq: Four times a day (QID) | INTRAMUSCULAR | Status: DC | PRN

## 2020-06-19 MED ORDER — FLUTICASONE PROPIONATE 50 MCG/ACT NA SUSP
1.0000 | Freq: Every day | NASAL | Status: DC | PRN
  Filled 2020-06-19: qty 16

## 2020-06-19 MED ORDER — ROSUVASTATIN CALCIUM 10 MG PO TABS
20.0000 mg | ORAL_TABLET | Freq: Every day | ORAL | Status: DC
  Administered 2020-06-20: 09:00:00 20 mg via ORAL
  Filled 2020-06-19: qty 1
  Filled 2020-06-19: qty 2
  Filled 2020-06-19: qty 1

## 2020-06-19 MED ORDER — OMEGA-3-ACID ETHYL ESTERS 1 G PO CAPS
1.0000 g | ORAL_CAPSULE | Freq: Two times a day (BID) | ORAL | Status: DC
  Administered 2020-06-19 – 2020-06-20 (×2): 1 g via ORAL
  Filled 2020-06-19 (×2): qty 1

## 2020-06-19 MED ORDER — MIDAZOLAM HCL 2 MG/2ML IJ SOLN
INTRAMUSCULAR | Status: DC | PRN
  Administered 2020-06-19: 1 mg via INTRAVENOUS

## 2020-06-19 SURGICAL SUPPLY — 14 items
BALLN ~~LOC~~ TREK RX 2.75X15 (BALLOONS) ×4
BALLOON ~~LOC~~ TREK RX 2.75X15 (BALLOONS) ×2 IMPLANT
CATH INFINITI 5FR JK (CATHETERS) ×4 IMPLANT
CATH INFINITI JR4 5F (CATHETERS) ×4 IMPLANT
CATH LAUNCHER 6FR EBU3.5 (CATHETERS) ×4 IMPLANT
DEVICE RAD TR BAND REGULAR (VASCULAR PRODUCTS) ×4 IMPLANT
GLIDESHEATH SLEND SS 6F .021 (SHEATH) ×4 IMPLANT
GUIDEWIRE INQWIRE 1.5J.035X260 (WIRE) ×2 IMPLANT
INQWIRE 1.5J .035X260CM (WIRE) ×4
KIT ENCORE 26 ADVANTAGE (KITS) ×4 IMPLANT
KIT MANI 3VAL PERCEP (MISCELLANEOUS) ×4 IMPLANT
PACK CARDIAC CATH (CUSTOM PROCEDURE TRAY) ×4 IMPLANT
STENT RESOLUTE ONYX 2.75X18 (Permanent Stent) ×4 IMPLANT
WIRE RUNTHROUGH .014X180CM (WIRE) ×4 IMPLANT

## 2020-06-19 NOTE — Progress Notes (Signed)
Dr. Fletcher Anon at bedside, speaking with pt. And his wife re: cath/PCI results. Both verbalized understanding of conversation.

## 2020-06-19 NOTE — Interval H&P Note (Signed)
History and Physical Interval Note:Cath Lab Visit (complete for each Cath Lab visit)  Clinical Evaluation Leading to the Procedure:   ACS: No.  Non-ACS:    Anginal Classification: CCS III  Anti-ischemic medical therapy: Minimal Therapy (1 class of medications)  Non-Invasive Test Results: Intermediate-risk stress test findings: cardiac mortality 1-3%/year  Prior CABG: No previous CABG        06/19/2020 12:35 PM  [name]  has presented today for surgery, with the diagnosis of LT Heart Cath   CAD  Abnormal cardiovascular stress test.  The various methods of treatment have been discussed with the patient and family. After consideration of risks, benefits and other options for treatment, the patient has consented to  Procedure(s): LEFT HEART CATH AND CORONARY ANGIOGRAPHY (Left) CORONARY STENT INTERVENTION (N/A) as a surgical intervention.  The patient's history has been reviewed, patient examined, no change in status, stable for surgery.  I have reviewed the patient's chart and labs.  Questions were answered to the patient's satisfaction.     Clifford Morse

## 2020-06-20 ENCOUNTER — Encounter: Payer: Self-pay | Admitting: Cardiovascular Disease

## 2020-06-20 DIAGNOSIS — I208 Other forms of angina pectoris: Secondary | ICD-10-CM

## 2020-06-20 DIAGNOSIS — I25118 Atherosclerotic heart disease of native coronary artery with other forms of angina pectoris: Secondary | ICD-10-CM | POA: Diagnosis not present

## 2020-06-20 LAB — BASIC METABOLIC PANEL
Anion gap: 9 (ref 5–15)
BUN: 11 mg/dL (ref 6–20)
CO2: 24 mmol/L (ref 22–32)
Calcium: 9 mg/dL (ref 8.9–10.3)
Chloride: 103 mmol/L (ref 98–111)
Creatinine, Ser: 0.75 mg/dL (ref 0.61–1.24)
GFR, Estimated: >60 mL/min (ref >60)
Glucose, Bld: 123 mg/dL — ABNORMAL HIGH (ref 70–99)
Potassium: 3.6 mmol/L (ref 3.5–5.1)
Sodium: 136 mmol/L (ref 135–145)

## 2020-06-20 LAB — CBC
HCT: 40.3 % (ref 39.0–52.0)
Hemoglobin: 14.3 g/dL (ref 13.0–17.0)
MCH: 31.5 pg (ref 26.0–34.0)
MCHC: 35.5 g/dL (ref 30.0–36.0)
MCV: 88.8 fL (ref 80.0–100.0)
Platelets: 284 10*3/uL (ref 150–400)
RBC: 4.54 MIL/uL (ref 4.22–5.81)
RDW: 11.6 % (ref 11.5–15.5)
WBC: 9.9 10*3/uL (ref 4.0–10.5)
nRBC: 0 % (ref 0.0–0.2)

## 2020-06-20 MED ORDER — CARVEDILOL 25 MG PO TABS: 25.0000 mg | ORAL_TABLET | Freq: Two times a day (BID) | ORAL | 3 refills | Status: DC

## 2020-06-20 MED ORDER — ROSUVASTATIN CALCIUM 20 MG PO TABS: 20.0000 mg | ORAL_TABLET | Freq: Every day | ORAL | 3 refills | Status: DC

## 2020-06-20 MED ORDER — FENOFIBRATE 160 MG PO TABS: 160.0000 mg | ORAL_TABLET | Freq: Every day | ORAL | 3 refills | Status: DC

## 2020-06-20 MED ORDER — CLOPIDOGREL BISULFATE 75 MG PO TABS: 75.0000 mg | ORAL_TABLET | Freq: Every day | ORAL | 3 refills | Status: DC

## 2020-06-20 MED ORDER — OMEGA-3-ACID ETHYL ESTERS 1 G PO CAPS: 1.0000 g | ORAL_CAPSULE | Freq: Two times a day (BID) | ORAL | 3 refills | Status: DC

## 2020-06-20 MED ORDER — LISINOPRIL-HYDROCHLOROTHIAZIDE 10-12.5 MG PO TABS: 1.0000 | ORAL_TABLET | Freq: Every day | ORAL | 3 refills | Status: DC

## 2020-06-20 NOTE — Discharge Instructions (Signed)
You have received care from [facility] during this hospital stay and we look forward to continuing to provide you with excellent care in our office settings after you've left the hospital.  In order to assure a smoother transition to home following your discharge from the hospital, we will likely have you see one of our nurse practitioners or physician assistants within a few weeks of discharge.  Our advanced practice providers work closely with your physician in order to address all of your heart's needs in a timely manner.  More information about all of our providers may be found here: [email or web address]   Please plan to bring all of your prescriptions to your follow-up appointment and don't hesitate to contact us with questions or concerns.  [place] [place], [place] [address]  No driving for 7 days. No lifting over 5 lbs for 1 week. No sexual activity for 1 week. Keep procedure site clean & dry. If you notice increased pain, swelling, bleeding or pus, call/return!  You may shower, but no soaking baths/hot tubs/pools for 1 week.   Radial Site Care Refer to this sheet in the next few weeks. These instructions provide you with information on caring for yourself after your procedure. Your caregiver may also give you more specific instructions. Your treatment has been planned according to current medical practices, but problems sometimes occur. Call your caregiver if you have any problems or questions after your procedure. HOME CARE INSTRUCTIONS  You may shower the day after the procedure. Remove the bandage (dressing) and gently wash the site with plain soap and water. Gently pat the site dry.   Do not apply powder or lotion to the site.   Do not submerge the affected site in water for 3 to 5 days.   Inspect the site at least twice daily.   Do not  flex or bend the affected arm for 24 hours.   No lifting over 5 pounds (2.3 kg) for 5 days after your procedure.  What to expect:  Any bruising will usually fade within 1 to 2 weeks.   Blood that collects in the tissue (hematoma) may be painful to the touch. It should usually decrease in size and tenderness within 1 to 2 weeks.  SEEK IMMEDIATE MEDICAL CARE IF:  You have unusual pain at the radial site.   You have redness, warmth, swelling, or pain at the radial site.   You have drainage (other than a small amount of blood on the dressing).   You have chills.   You have a fever or persistent symptoms for more than 72 hours.   You have a fever and your symptoms suddenly get worse.   Your arm becomes pale, cool, tingly, or numb.   You have heavy bleeding from the site. Hold pressure on the site.      10 Habits of Highly Healthy [facility] wants to help you get well and stay well.  Live a longer, healthier life by practicing healthy habits every day.  1.  Visit your primary care provider regularly. 2.  Make time for family and friends.  Healthy relationships are important. 3.  Take medications as directed by your provider. 4.  Maintain a healthy weight and a trim waistline. 5.  Eat healthy meals and snacks, rich in fruits, vegetables, whole grains, and lean proteins. 6.  Get moving every day - aim for 150 minutes of  moderate physical activity each week. 7.  Don't smoke. 8.  Avoid alcohol or drink in moderation. 9.  Manage stress through meditation or mindful relaxation. 10.  Get seven to nine hours of quality sleep each night.  Want more information on healthy habits?  To learn more about these and other healthy habits, visit [employer].  ------------  As we discussed, please hold your metformin for 48 hours after your catheterization to protect your kidneys. You may restart your metformin on 06/21/20.  Refills were provided for any medications as  need.  We started you on Crestor to help control your cholesterol and Lovaza, which is fish oil.

## 2020-06-20 NOTE — Discharge Summary (Signed)
Discharge Summary    Patient ID: Clifford Morse MRN: [id number]; DOB: February 13, 1961  Admit date: 06/19/2020 Discharge date: 06/20/2020  Primary Care Provider: Olin Hauser, DO  Primary Cardiologist: Kate Sable, MD  Primary Electrophysiologist:  None   Discharge Diagnoses    Principal Problem:   Stable angina [facility]) Active Problems:   CAD (coronary artery disease)   Abnormal stress ECG   Essential hypertension   Tobacco abuse   Hyperlipidemia associated with type 2 diabetes mellitus [facility])    Diagnostic Studies/Procedures     LHC  06/19/2020  The left ventricular systolic function is normal.  LV end diastolic pressure is mildly elevated.  The left ventricular ejection fraction is 55-65% by visual estimate.  Prox Cx to Dist Cx lesion is 30% stenosed.  Prox LAD lesion is 30% stenosed.  Prox RCA to Mid RCA lesion is 30% stenosed.  Lat 2nd Diag lesion is 99% stenosed.  Mid LAD lesion is 30% stenosed.  Mid LAD to Dist LAD lesion is 80% stenosed.  Post intervention, there is a 0% residual stenosis.  A drug-eluting stent was successfully placed using a [place] [id number].  Dist LAD lesion is 80% stenosed. 1.  Significant one-vessel coronary artery disease with patent stent in the mid LAD with mild in-stent restenosis, new 80% stenosis in the mid to distal LAD and 80% diffuse distal LAD disease close to the apex.  Severe subocclusive disease in the inferior branch of diagonal which is small in diameter and similar to previous angiography.  Mild left circumflex and RCA disease. 2.  Normal LV systolic function mildly elevated left ventricular end-diastolic pressure. 3.  Successful drug-eluting stent placement to the mid/distal LAD. Recommendations: Dual antiplatelet therapy for at least 6 months.  The distal LAD disease close to the apex will be treated medically. Recommend aggressive treatment of risk factors. The patient needs to be on a  statin therapy regardless of his LDL and thus I started rosuvastatin. Likely discharge home tomorrow.  Coronary Findings   Diagnostic Dominance: Right  Left Anterior Descending  Prox LAD lesion is 30% stenosed.  Mid LAD lesion is 30% stenosed. The lesion was previously treated.  Mid LAD to Dist LAD lesion is 80% stenosed. The lesion is not complex (non high-C).  Dist LAD lesion is 80% stenosed.  Second Diagonal Branch  Vessel is large in size.  Lateral Second Diagonal Branch  Lat 2nd Diag lesion is 99% stenosed.  Left Circumflex  Prox Cx to Dist Cx lesion is 30% stenosed.  Right Coronary Artery  Prox RCA to Mid RCA lesion is 30% stenosed.   Intervention   Mid LAD to Dist LAD lesion  Stent  Lesion length: 16 mm. CATH LAUNCHER 6FR EBU3.5 guide catheter was inserted. Lesion crossed with guidewire using a [place] .[id number]. Pre-stent angioplasty was not performed. A drug-eluting stent was successfully placed using a [place] [id number]. Maximum pressure: 12 atm. Inflation time: 20 sec. Post-stent angioplasty was performed using a [place] [id number]. Maximum pressure: 18 atm. Inflation time: 20 sec.  Post-Intervention Lesion Assessment  The intervention was successful. Pre-interventional TIMI flow is 3. Post-intervention TIMI flow is 3. No complications occurred at this lesion.  There is a 0% residual stenosis post intervention.    Wall Motion  Resting                Left Heart  Left Ventricle The left ventricular size is normal. The left ventricular systolic function is normal.  LV end diastolic pressure is mildly elevated. The left ventricular ejection fraction is 55-65% by visual estimate. No regional wall motion abnormalities.   Coronary Diagrams   Diagnostic Dominance: Right    Intervention        _____________   History of Present Illness     Clifford Morse is a 59 y.o. male with history of MI/CAD s/p DES to mid LAD 03/2013,  s/p DES to mid LAD 06/19/2020, hypertension, hyperlipidemia, tobacco abuse, and COPD.  He has history of CAD and MI in 2014, treated with DES to LAD. Seen 05/09/2020 with recommendation for Lexiscan and echo due to exertional chest tightness and dyspnea. Fenofibrate is also recommended due to hypertriglyceridemia. Lexiscan 05/19/2017 defect of moderate severity present in the mid anterior and apical location, deemed intermediate risk. Attenuation correction images showed partially reversible mid to distal anterior wall infarct, suggestive of LAD ischemia. He was seen again in clinic by Laurann Montana, NP, 05/22/2020. We continued to report dyspnea. Recommendation was to proceed with outpatient cardiac catheterization.   Hospital Course     On 06/19/2020, he underwent catheterization by Dr. Fletcher Anon that significant 1v CAD with patent stent in mLAD and mild ISR, new 80%s of the m-dLAD, and 80% diffuse dLAD dz close to the apex. Severe subocclusive dz in the inferior branch of the diag, which was small in diameter and similar to previous angiography. More specifically, LHC showed LVSF 55 -65% by visual estimate, mildly elevated LVEDP, pLCx - dLCx circumflex 70%s, pLAD 30%s, pRCA to mRCA 30%s, second diag 99%s, mLAD 30%s, mLAD to dLAD 80% stenosed. DES was placed to the m-dLAD with 0% residual stenosis.   He was monitored overnight and evaluated today with labs and vitals reviewed. MD has seen and examined the patient today and feels patient is stable for discharge after successful ambulation.  Recommendations include DAPT for at least 6 months. dLAD dz close to the apex will be treated medically and with aggressive tx of risk factors. Regardless of LDL, he should be on statin therapy. He will need scheduled for follow-up in the office with Dr. Garen Lah as scheduled om 06/27/20. He is aware of the recommendation to hold metformin for 48 hours following his catheterization. Post cath care instructions reviewed  and refills provided for any medications. We reviewed his statin therapy together.    Consultants: None    Did the patient have an acute coronary syndrome (MI, NSTEMI, STEMI, etc) this admission?:  No                               Did the patient have a percutaneous coronary intervention (stent / angioplasty)?:  Yes.     Cath/PCI Registry Performance & Quality Measures: 1. Aspirin prescribed? - Yes 2. ADP Receptor Inhibitor (Plavix/Clopidogrel, Brilinta/Ticagrelor or Effient/Prasugrel) prescribed (includes medically managed patients)? - Yes 3. High Intensity Statin (Lipitor 40-29m or Crestor 20-462m prescribed? - Yes 4. For EF <40%, was ACEI/ARB prescribed? - Not Applicable (EF >/= 4029%5. For EF <40%, Aldosterone Antagonist (Spironolactone or Eplerenone) prescribed? - Not Applicable (EF >/= 4024%6. Cardiac Rehab Phase II ordered? - Yes       _____________  Discharge Vitals Blood pressure 133/82, pulse 70, temperature 97.9 F (36.6 C), temperature source Oral, resp. rate 16, height _0  (1.753 m), weight 77.4 kg, SpO2 98 %.  Filed Weights   06/19/20 1014 06/20/20 0446  Weight: 75.8 kg 77.4 kg  Labs & Radiologic Studies    CBC Recent Labs    06/20/20 0437  WBC 9.9  HGB 14.3  HCT 40.3  MCV 88.8  PLT 854   Basic Metabolic Panel Recent Labs    06/20/20 0437  NA 136  K 3.6  CL 103  CO2 24  GLUCOSE 123*  BUN 11  CREATININE 0.75  CALCIUM 9.0   Liver Function Tests No results for input(s): AST, ALT, ALKPHOS, BILITOT, PROT, ALBUMIN in the last 72 hours. No results for input(s): LIPASE, AMYLASE in the last 72 hours. High Sensitivity Troponin:   No results for input(s): TROPONINIHS in the last 720 hours.  BNP Invalid input(s): POCBNP D-Dimer No results for input(s): DDIMER in the last 72 hours. Hemoglobin A1C No results for input(s): HGBA1C in the last 72 hours. Fasting Lipid Panel No results for input(s): CHOL, HDL, LDLCALC, TRIG, CHOLHDL, LDLDIRECT in  the last 72 hours. Thyroid Function Tests No results for input(s): TSH, T4TOTAL, T3FREE, THYROIDAB in the last 72 hours.  Invalid input(s): FREET3 _____________  CARDIAC CATHETERIZATION  Result Date: 06/19/2020  The left ventricular systolic function is normal.  LV end diastolic pressure is mildly elevated.  The left ventricular ejection fraction is 55-65% by visual estimate.  Prox Cx to Dist Cx lesion is 30% stenosed.  Prox LAD lesion is 30% stenosed.  Prox RCA to Mid RCA lesion is 30% stenosed.  Lat 2nd Diag lesion is 99% stenosed.  Mid LAD lesion is 30% stenosed.  Mid LAD to Dist LAD lesion is 80% stenosed.  Post intervention, there is a 0% residual stenosis.  A drug-eluting stent was successfully placed using a [place] [id number].  Dist LAD lesion is 80% stenosed.  1.  Significant one-vessel coronary artery disease with patent stent in the mid LAD with mild in-stent restenosis, new 80% stenosis in the mid to distal LAD and 80% diffuse distal LAD disease close to the apex.  Severe subocclusive disease in the inferior branch of diagonal which is small in diameter and similar to previous angiography.  Mild left circumflex and RCA disease. 2.  Normal LV systolic function mildly elevated left ventricular end-diastolic pressure. 3.  Successful drug-eluting stent placement to the mid/distal LAD. Recommendations: Dual antiplatelet therapy for at least 6 months.  The distal LAD disease close to the apex will be treated medically. Recommend aggressive treatment of risk factors. The patient needs to be on a statin therapy regardless of his LDL and thus I started rosuvastatin. Likely discharge home tomorrow.   DG Knee Complete 4 Views Right  Result Date: 06/07/2020 CLINICAL DATA:  chronic bilateral joint and muscle pain for years, right hip and knee worse, no injury, rule out arthritis EXAM: RIGHT KNEE - COMPLETE 4+ VIEW COMPARISON:  02/05/2013. FINDINGS: No evidence of fracture,  dislocation, or joint effusion. No evidence of arthropathy or other focal bone abnormality. No soft tissue swelling. IMPRESSION: Negative. Electronically Signed   By: Primitivo Gauze M.D.   On: 06/07/2020 16:29   ECHOCARDIOGRAM COMPLETE  Result Date: 05/24/2020    ECHOCARDIOGRAM REPORT   Patient Name:   ANUJ SUMMONS Date of Exam: 05/24/2020 Medical Rec #:  [id number]     Height:       69.0 in Accession #:    [id number]    Weight:       175.0 lb Date of Birth:  [birth year]     BSA:          1.952 m Patient Age:  59 years      BP:           136/76 mmHg Patient Gender: M             HR:           70 bpm. Exam Location:  [facility] Procedure: 2D Echo, 3D Echo, Cardiac Doppler and Color Doppler Indications:    I25.110 Atherosclerotic heart disease of native coronary artery                 with unstable angina pectoris  History:        Patient has no prior history of Echocardiogram examinations.                 CAD, Previous Myocardial Infarction and DES to mid LAD 03/2013.,                 Signs/Symptoms:Chest Pain and DOE; Risk Factors:Hypertension,                 Current Smoker, Dyslipidemia and Diabetes. 05/19/20 NM study                 showed medium defect of moderate severity present in the mid                 anterior and apical anterior location.  Sonographer:    Hester Mates BS, RVT, RDCS Referring Phys: [id number] BRIAN AGBOR-ETANG IMPRESSIONS  1. Left ventricular ejection fraction, by estimation, is 60 to 65%. Left ventricular ejection fraction by 3D volume is 59 %. The left ventricle has normal function. The left ventricle has no regional wall motion abnormalities. Left ventricular diastolic  parameters were normal.  2. Right ventricular systolic function is normal. The right ventricular size is normal.  3. The mitral valve is normal in structure. No evidence of mitral valve regurgitation. No evidence of mitral stenosis.  4. The aortic valve is normal in structure. Aortic valve regurgitation is not  visualized. No aortic stenosis is present.  5. The inferior vena cava is normal in size with greater than 50% respiratory variability, suggesting right atrial pressure of 3 mmHg. FINDINGS  Left Ventricle: Left ventricular ejection fraction, by estimation, is 60 to 65%. Left ventricular ejection fraction by 3D volume is 59 %. The left ventricle has normal function. The left ventricle has no regional wall motion abnormalities. The left ventricular internal cavity size was normal in size. There is no left ventricular hypertrophy. Left ventricular diastolic parameters were normal. Right Ventricle: The right ventricular size is normal. No increase in right ventricular wall thickness. Right ventricular systolic function is normal. Left Atrium: Left atrial size was normal in size. Right Atrium: Right atrial size was normal in size. Pericardium: There is no evidence of pericardial effusion. Mitral Valve: The mitral valve is normal in structure. No evidence of mitral valve regurgitation. No evidence of mitral valve stenosis. Tricuspid Valve: The tricuspid valve is normal in structure. Tricuspid valve regurgitation is not demonstrated. No evidence of tricuspid stenosis. Aortic Valve: The aortic valve is normal in structure. Aortic valve regurgitation is not visualized. No aortic stenosis is present. Pulmonic Valve: The pulmonic valve was normal in structure. Pulmonic valve regurgitation is not visualized. No evidence of pulmonic stenosis. Aorta: The aortic root is normal in size and structure. Venous: The inferior vena cava is normal in size with greater than 50% respiratory variability, suggesting right atrial pressure of 3 mmHg. IAS/Shunts: No atrial level shunt detected by color flow Doppler.  LEFT VENTRICLE PLAX 2D LVIDd:         5.20 cm         Diastology LVIDs:         3.40 cm         LV e' medial:    9.03 cm/s LV PW:         0.90 cm         LV E/e' medial:  9.7 LV IVS:        0.90 cm         LV e' lateral:   11.70 cm/s  LVOT diam:     2.10 cm         LV E/e' lateral: 7.5 LVOT Area:     3.46 cm                                 3D Volume EF LV Volumes (MOD)               LV 3D EF:    Left LV vol d, MOD    61.3 ml                    ventricular A2C:                                        ejection LV vol d, MOD    101.0 ml                   fraction by A4C:                                        3D volume LV vol s, MOD    21.0 ml                    is 59 %. A2C: LV vol s, MOD    42.4 ml A4C:                           3D Volume EF: LV SV MOD A2C:   40.3 ml       3D EF:        59 % LV SV MOD A4C:   101.0 ml      LV EDV:       133 ml LV SV MOD BP:    49.8 ml       LV ESV:       55 ml                                LV SV:        78 ml RIGHT VENTRICLE RV Basal diam:  4.10 cm RV Mid diam:    2.70 cm RV S prime:     12.30 cm/s TAPSE (M-mode): 2.5 cm LEFT ATRIUM             Index       RIGHT ATRIUM           Index LA diam:        3.40 cm 1.74 cm/m  RA Area:  15.50 cm LA Vol (A2C):   37.2 ml 19.06 ml/m RA Volume:   43.10 ml  22.08 ml/m LA Vol (A4C):   31.2 ml 15.98 ml/m LA Biplane Vol: 33.8 ml 17.32 ml/m                        PULMONIC VALVE AORTA                 PV Vmax:       1.19 m/s Ao Root diam: 3.30 cm PV Peak grad:  5.7 mmHg Ao Asc diam:  3.20 cm  MITRAL VALVE MV Area (PHT): 3.42 cm    SHUNTS MV Decel Time: 222 msec    Systemic Diam: 2.10 cm MV E velocity: 87.30 cm/s MV A velocity: 71.70 cm/s MV E/A ratio:  1.22 Kate Sable MD Electronically signed by Kate Sable MD Signature Date/Time: 05/24/2020/2:33:16 PM    Final    DG HIP UNILAT W OR W/O PELVIS 2-3 VIEWS RIGHT  Result Date: 06/07/2020 CLINICAL DATA:  Right hip pain EXAM: DG HIP (WITH OR WITHOUT PELVIS) 2-3V RIGHT COMPARISON:  None. FINDINGS: There is no evidence of hip fracture or dislocation. Hip joint space is maintained. There is some osteophytosis along the superolateral aspect of the acetabula bilaterally. Pelvic bony ring intact. IMPRESSION: Mild  degenerative changes of the bilateral hips. Electronically Signed   By: Davina Poke D.O.   On: 06/07/2020 16:31   Disposition   Pt is being discharged home today in good condition.  Vital Signs. BP 133/82 (BP Location: Left Arm)   Pulse 70   Temp 97.9 F (36.6 C) (Oral)   Resp 16   Ht _0  (1.753 m)   Wt 77.4 kg   SpO2 98%   BMI 25.21 kg/m    General: Well developed, well nourished, in no acute distress. Head: Normocephalic, atraumatic, sclera non-icteric, no xanthomas, nares are without discharge. Neck: Negative for carotid bruits. JVP not elevated. Lungs: Clear bilaterally to auscultation without wheezes, rales, or rhonchi. Breathing is unlabored. Heart: RRR S1 S2 without murmurs, rubs, or gallops.  Radial catheterization site without signs of infection or hematoma with dressing clean, dry, and intact.  Abdomen: Soft, non-tender, non-distended with normoactive bowel sounds. No rebound/guarding. Extremities: No clubbing or cyanosis. No edema. Distal pedal pulses are 2+ and equal bilaterally. Neuro: Alert and oriented X 3. Moves all extremities spontaneously. Psych:  Responds to questions appropriately with a normal affect.  Follow-up Plans & Appointments     Follow-up Information    Kate Sable, MD Follow up.   Specialties: Cardiology, Radiology Why: Follow-up as scheduled 06/27/20 at 08:40AM with Dr. Garen Lah.  Contact information: [place] [address] [phone]              Discharge Instructions    AMB Referral to Cardiac Rehabilitation - Phase II   Complete by: As directed    Diagnosis:  Coronary Stents Stable Angina     After initial evaluation and assessments completed: Virtual Based Care may be provided alone or in conjunction with Phase 2 Cardiac Rehab based on patient barriers.: Yes      Discharge Medications   Allergies as of 06/20/2020   No Known Allergies     Medication List    STOP taking these  medications   lovastatin 40 MG tablet Commonly known as: MEVACOR   Omega 3 1000 MG Caps     TAKE these medications   aspirin  81 MG tablet Take 81 mg by mouth daily. Notes to patient: Patients taking blood thinners should generally stay away from medicines like ibuprofen, Advil, Motrin, naproxen, and Aleve due to risk of stomach bleeding. You may take Tylenol as directed or talk to your primary doctor about alternatives.   carvedilol 25 MG tablet Commonly known as: COREG Take 1 tablet (25 mg total) by mouth 2 (two) times daily with a meal. What changed: See the new instructions.   clopidogrel 75 MG tablet Commonly known as: PLAVIX Take 1 tablet (75 mg total) by mouth daily. Notes to patient: Patients taking blood thinners should generally stay away from medicines like ibuprofen, Advil, Motrin, naproxen, and Aleve due to risk of stomach bleeding. You may take Tylenol as directed or talk to your primary doctor about alternatives.   DULoxetine 60 MG capsule Commonly known as: CYMBALTA Take 1 capsule (60 mg total) by mouth daily.   fenofibrate 160 MG tablet Take 1 tablet (160 mg total) by mouth daily. What changed:   medication strength  how much to take   fluticasone 50 MCG/ACT nasal spray Commonly known as: FLONASE Place 1 spray into both nostrils daily as needed for allergies or rhinitis.   gabapentin 100 MG capsule Commonly known as: NEURONTIN Start 1 capsule daily, increase by 1 cap every 2-3 days as tolerated up to 3 times a day, or may take 3 at once in evening.   lisinopril-hydrochlorothiazide 10-12.5 MG tablet Commonly known as: ZESTORETIC Take 1 tablet by mouth daily.   metFORMIN 750 MG 24 hr tablet Commonly known as: Glucophage XR Take 2 tablets (1,500 mg total) by mouth daily with breakfast. Notes to patient: Please hold your metformin for 48 hours after your catheterization to protect your kidneys. You may restart it 06/21/20.   omega-3 acid ethyl esters 1 g  capsule Commonly known as: LOVAZA Take 1 capsule (1 g total) by mouth 2 (two) times daily.   pantoprazole 40 MG tablet Commonly known as: Protonix Take 1 tablet (40 mg total) by mouth daily before breakfast.   pseudoephedrine-acetaminophen 30-500 MG Tabs tablet Commonly known as: TYLENOL SINUS Take 2 tablets by mouth 2 (two) times daily as needed (congestion).   rosuvastatin 20 MG tablet Commonly known as: CRESTOR Take 1 tablet (20 mg total) by mouth daily.   traZODone 100 MG tablet Commonly known as: DESYREL Take 1 tablet (100 mg total) by mouth at bedtime.          Outstanding Labs/Studies   None.    Duration of Discharge Encounter   Greater than 30 minutes including physician time.  Signed, Arvil Chaco, PA-C 06/20/2020, 9:01 AM

## 2020-06-20 NOTE — Progress Notes (Signed)
EKG machine broken per night shift report. Unable to obtain EKG. Ticket placed for repair.

## 2020-06-27 ENCOUNTER — Ambulatory Visit (INDEPENDENT_AMBULATORY_CARE_PROVIDER_SITE_OTHER): Payer: 59 | Admitting: Cardiology

## 2020-06-27 ENCOUNTER — Encounter: Payer: Self-pay | Admitting: Cardiology

## 2020-06-27 ENCOUNTER — Other Ambulatory Visit: Payer: Self-pay

## 2020-06-27 VITALS — BP 126/78 | HR 73 | Ht 69.0 in | Wt 174.0 lb

## 2020-06-27 DIAGNOSIS — I2510 Atherosclerotic heart disease of native coronary artery without angina pectoris: Secondary | ICD-10-CM

## 2020-06-27 DIAGNOSIS — R0602 Shortness of breath: Secondary | ICD-10-CM

## 2020-06-27 DIAGNOSIS — I10 Essential (primary) hypertension: Secondary | ICD-10-CM | POA: Diagnosis not present

## 2020-06-27 DIAGNOSIS — E785 Hyperlipidemia, unspecified: Secondary | ICD-10-CM

## 2020-06-27 DIAGNOSIS — F17200 Nicotine dependence, unspecified, uncomplicated: Secondary | ICD-10-CM | POA: Diagnosis not present

## 2020-06-27 NOTE — Patient Instructions (Signed)
Medication Instructions:   Your physician recommends that you continue on your current medications as directed. Please refer to the Current Medication list given to you today.  *If you need a refill on your cardiac medications before your next appointment, please call your pharmacy*   Lab Work:  Your physician recommends that you return for a FASTING lipid profile: the week prior to your 3 month follow up.  - You will need to be fasting. Please do not have anything to eat or drink after midnight the morning you have the lab work. You may only have water or black coffee with no cream or sugar.  - Please go to the [facility]. You will check in at the front desk to the right as you walk into the atrium. Valet Parking is offered if needed. - No appointment needed. You may go any day between 7 am and 6 pm.   Testing/Procedures: None Ordered   Follow-Up: At [facility], you and your health needs are our priority.  As part of our continuing mission to provide you with exceptional heart care, we have created designated Provider Care Teams.  These Care Teams include your primary Cardiologist (physician) and Advanced Practice Providers (APPs -  Physician Assistants and Nurse Practitioners) who all work together to provide you with the care you need, when you need it.  We recommend signing up for the patient portal called "MyChart".  Sign up information is provided on this After Visit Summary.  MyChart is used to connect with patients for Virtual Visits (Telemedicine).  Patients are able to view lab/test results, encounter notes, upcoming appointments, etc.  Non-urgent messages can be sent to your provider as well.   To learn more about what you can do with MyChart, go to [email or web address].    Your next appointment:   3 month(s)  The format for your next appointment:   In Person  Provider:   Brian Agbor-Etang, MD   Other Instructions   

## 2020-06-27 NOTE — Progress Notes (Signed)
Cardiology Office Note:    Date:  06/27/2020   ID:  Clifford Morse, DOB 1960/12/17, MRN [id number]  PCP:  Clifford Hauser, DO  [place] Cardiologist:  Clifford Sable, MD  [place] Electrophysiologist:  None   Referring MD: Clifford Morse *   Chief Complaint  Patient presents with  . Follow-up    Follow up from procedure. C/o feeling tired and DOE. Medications verbally reviewed with patient.      History of Present Illness:    Clifford Morse is a 59 y.o. male with a hx of MI/CAD (s/p DES to mid LAD 03/2013, recent mid to distal LAD stent 06/19/2020), hypertension, hyperlipidemia, current smoker x35+ years, COPD who presents for follow-up.   He was last seen due to chest pain, Lexiscan obtained was abnormal.  Underwent a left heart cath which showed previously patent stent in the mid LAD, new 80% stenosis in the mid to distal LAD.  Had a drug-eluting stent placed.  He states having shortness of breath with walking from parking lot to the office today.  Denies any chest pressure.  Tolerating medications as prescribed.  Prior notes Had an MI in 2014, drug-eluting stent was placed to the LAD. Left heart cath 06/19/2020 patent mid LAD stent, mid to distal LAD 80% disease status post DES. Mild left circumflex and RCA disease,  Echo 05/24/2020 showed normal systolic and diastolic function, EF 60 to 65%  Past Medical History:  Diagnosis Date  . Allergy   . Arthritis   . Asthma   . Chest pain on exertion   . Diabetes mellitus, type 2 ([place])   . GERD (gastroesophageal reflux disease)   . Hyperlipidemia   . Myocardial infarction ([place]) 2014  . Vertigo    with sinus issues    Past Surgical History:  Procedure Laterality Date  . CARDIAC CATHETERIZATION  2014   stent  . CORONARY STENT INTERVENTION  2014  . CORONARY STENT INTERVENTION N/A 06/19/2020   Procedure: CORONARY STENT INTERVENTION;  Surgeon: Wellington Hampshire, MD;  Location: [facility];  Service: Cardiovascular;  Laterality: N/A;  . ESOPHAGOGASTRODUODENOSCOPY    . HERNIA REPAIR    . LEFT HEART CATH AND CORONARY ANGIOGRAPHY Left 06/19/2020   Procedure: LEFT HEART CATH AND CORONARY ANGIOGRAPHY;  Surgeon: Wellington Hampshire, MD;  Location: [facility];  Service: Cardiovascular;  Laterality: Left;    Current Medications: Current Meds  Medication Sig  . aspirin 81 MG tablet Take 81 mg by mouth daily.  . carvedilol (COREG) 25 MG tablet Take 1 tablet (25 mg total) by mouth 2 (two) times daily with a meal.  . clopidogrel (PLAVIX) 75 MG tablet Take 1 tablet (75 mg total) by mouth daily.  . DULoxetine (CYMBALTA) 60 MG capsule Take 1 capsule (60 mg total) by mouth daily.  . fenofibrate 160 MG tablet Take 1 tablet (160 mg total) by mouth daily.  . fluticasone (FLONASE) 50 MCG/ACT nasal spray Place 1 spray into both nostrils daily as needed for allergies or rhinitis.  Marland Kitchen gabapentin (NEURONTIN) 100 MG capsule Start 1 capsule daily, increase by 1 cap every 2-3 days as tolerated up to 3 times a day, or may take 3 at once in evening.  Marland Kitchen lisinopril-hydrochlorothiazide (ZESTORETIC) 10-12.5 MG tablet Take 1 tablet by mouth daily.  . metFORMIN (GLUCOPHAGE XR) 750 MG 24 hr tablet Take 2 tablets (1,500 mg total) by mouth daily with breakfast.  . omega-3 acid ethyl esters (LOVAZA) 1  g capsule Take 1 capsule (1 g total) by mouth 2 (two) times daily.  . pantoprazole (PROTONIX) 40 MG tablet Take 1 tablet (40 mg total) by mouth daily before breakfast.  . pseudoephedrine-acetaminophen (TYLENOL SINUS) 30-500 MG TABS tablet Take 2 tablets by mouth 2 (two) times daily as needed (congestion).  . rosuvastatin (CRESTOR) 20 MG tablet Take 1 tablet (20 mg total) by mouth daily.  . traZODone (DESYREL) 100 MG tablet Take 1 tablet (100 mg total) by mouth at bedtime.     Allergies:   Patient has no known allergies.   Social History   Socioeconomic History  . Marital status: Married    Spouse  name: Clifford Morse   . Number of children: 2  . Years of education: Not on file  . Highest education level: Not on file  Occupational History  . Not on file  Tobacco Use  . Smoking status: Current Every Day Smoker    Packs/day: 1.50    Years: 25.00    Pack years: 37.50    Types: Cigarettes  . Smokeless tobacco: Former Systems developer  . Tobacco comment: since approx age 73  Vaping Use  . Vaping Use: Never used  Substance and Sexual Activity  . Alcohol use: Yes    Alcohol/week: 0.0 standard drinks    Comment: rare  . Drug use: No  . Sexual activity: Not on file  Other Topics Concern  . Not on file  Social History Narrative   Lives at home with wife   Social Determinants of Health   Financial Resource Strain: Not on file  Food Insecurity: Not on file  Transportation Needs: Not on file  Physical Activity: Not on file  Stress: Not on file  Social Connections: Not on file     Family History: The patient's family history includes Diabetes in his mother; Emphysema in his father.  ROS:   Please see the history of present illness.     All other systems reviewed and are negative.  EKGs/Labs/Other Studies Reviewed:    The following studies were reviewed today:   EKG:  EKG is  ordered today.  The ekg ordered today demonstrates normal sinus rhythm,  Recent Labs: 04/18/2020: ALT 47; TSH 1.14 06/20/2020: BUN 11; Creatinine, Ser 0.75; Hemoglobin 14.3; Platelets 284; Potassium 3.6; Sodium 136  Recent Lipid Panel    Component Value Date/Time   CHOL 172 04/18/2020 0810   CHOL 163 06/23/2015 0933   CHOL 209 (H) 02/28/2013 0324   TRIG 404 (H) 04/18/2020 0810   TRIG 247 (H) 02/28/2013 0324   HDL 38 (L) 04/18/2020 0810   HDL 41 06/23/2015 0933   HDL 38 (L) 02/28/2013 0324   CHOLHDL 4.5 04/18/2020 0810   VLDL 43 (H) 08/26/2016 0001   VLDL 49 (H) 02/28/2013 0324   LDLCALC  04/18/2020 0810     Comment:     . LDL cholesterol not calculated. Triglyceride levels greater than 400 mg/dL  invalidate calculated LDL results. . Reference range: <100 . Desirable range <100 mg/dL for primary prevention;   <70 mg/dL for patients with CHD or diabetic patients  with > or = 2 CHD risk factors. Marland Kitchen LDL-C is now calculated using the Martin-Hopkins  calculation, which is a validated novel method providing  better accuracy than the Friedewald equation in the  estimation of LDL-C.  Cresenciano Genre et al. Annamaria Helling. [id number]): 2061-2068  (http://education.Quest[employer].com/faq/FAQ164)    LDLCALC 122 (H) 02/28/2013 0324     Risk Assessment/Calculations:  Physical Exam:    VS:  BP 126/78 (BP Location: Left Arm, Patient Position: Sitting, Cuff Size: Normal)   Pulse 73   Ht 5\' 9"  (1.753 m)   Wt 174 lb (78.9 kg)   SpO2 96%   BMI 25.70 kg/m     Wt Readings from Last 3 Encounters:  06/27/20 174 lb (78.9 kg)  06/20/20 170 lb 11.2 oz (77.4 kg)  06/06/20 174 lb (78.9 kg)     GEN:  Well nourished, well developed in no acute distress HEENT: Normal NECK: No JVD; No carotid bruits LYMPHATICS: No lymphadenopathy CARDIAC: RRR, no murmurs, rubs, gallops RESPIRATORY:  Clear to auscultation without rales, wheezing or rhonchi  ABDOMEN: Soft, non-tender, non-distended MUSCULOSKELETAL:  No edema; No deformity  SKIN: Warm and dry NEUROLOGIC:  Alert and oriented x 3 PSYCHIATRIC:  Normal affect   ASSESSMENT:    1. Coronary artery disease involving native coronary artery of native heart, unspecified whether angina present   2. Hyperlipidemia LDL goal <70   3. Primary hypertension   4. Smoking   5. SOB (shortness of breath)    PLAN:    In order of problems listed above:  1. Patient with history of CAD/MI s/p DES to LAD x 2.  Denies chest pain, continue aspirin, Plavix, Coreg, Crestor.  Last echo with preserved ejection fraction.  Refer patient to cardiac rehab. 2. Hyperlipidemia, elevated triglycerides, goal LDL less than 70.  Continue Crestor and fenofibrate.  Repeat fasting lipid  panel in 3 months. 3. hypertension, BP controlled.  Continue Coreg, lisinopril, HCTZ as prescribed. 4. Patient is a current smoker, cessation advised, 5. Shortness of breath associated with exertion.  Echo with normal EF, denies chest pain.  He is a 35+ year smoker.  Will refer patient to pulmonary medicine to evaluate presence of COPD/emphysema as contributing factor.  Follow-up in 3 months after lipid panel.    Shared Decision Making/Informed Consent      Medication Adjustments/Labs and Tests Ordered: Current medicines are reviewed at length with the patient today.  Concerns regarding medicines are outlined above.  Orders Placed This Encounter  Procedures  . Lipid panel  . AMB referral to cardiac rehabilitation  . Ambulatory referral to Pulmonology  . EKG 12-Lead   No orders of the defined types were placed in this encounter.   Patient Instructions  Medication Instructions:  Your physician recommends that you continue on your current medications as directed. Please refer to the Current Medication list given to you today.  *If you need a refill on your cardiac medications before your next appointment, please call your pharmacy*   Lab Work:  Your physician recommends that you return for a FASTING lipid profile: the week prior to your 3 month follow up.  - You will need to be fasting. Please do not have anything to eat or drink after midnight the morning you have the lab work. You may only have water or black coffee with no cream or sugar. - [facility]. You will check in at the front desk to the right as you walk into the atrium. Valet Parking is offered if needed. - No appointment needed. You may go any day between 7 am and 6 pm.   Testing/Procedures: None Ordered   Follow-Up: At [facility], you and your health needs are our priority.  As part of our continuing mission to provide you with exceptional heart care, we have created designated  Provider Care Teams.  These  Care Teams include your primary Cardiologist (physician) and Advanced Practice Providers (APPs -  Physician Assistants and Nurse Practitioners) who all work together to provide you with the care you need, when you need it.  We recommend signing up for the patient portal called "MyChart".  Sign up information is provided on this After Visit Summary.  MyChart is used to connect with patients for Virtual Visits (Telemedicine).  Patients are able to view lab/test results, encounter notes, upcoming appointments, etc.  Non-urgent messages can be sent to your provider as well.   To learn more about what you can do with MyChart, go to [email or web address].    Your next appointment:   3 month(s)  The format for your next appointment:   In Person  Provider:   Debbe Odea, MD   Other Instructions      Signed, Debbe Odea, MD  06/27/2020 10:47 AM    [facility]

## 2020-06-28 ENCOUNTER — Other Ambulatory Visit: Payer: Self-pay | Admitting: Family Medicine

## 2020-06-28 DIAGNOSIS — F5104 Psychophysiologic insomnia: Secondary | ICD-10-CM

## 2020-06-28 DIAGNOSIS — F331 Major depressive disorder, recurrent, moderate: Secondary | ICD-10-CM

## 2020-07-13 ENCOUNTER — Other Ambulatory Visit: Payer: Self-pay

## 2020-07-13 ENCOUNTER — Encounter: Payer: 59 | Attending: Cardiology

## 2020-07-13 DIAGNOSIS — Z955 Presence of coronary angioplasty implant and graft: Secondary | ICD-10-CM

## 2020-07-13 NOTE — Progress Notes (Signed)
Virtual Visit completed. Patient informed on EP and RD appointment and 6 Minute walk test. Patient also informed of patient health questionnaires on My Chart. Patient Verbalizes understanding. Visit diagnosis can be found in [facility] 06/19/2020.

## 2020-07-19 ENCOUNTER — Telehealth: Payer: Self-pay

## 2020-07-19 NOTE — Telephone Encounter (Signed)
Patient called and stated he was no longer interested in participating in Cardiac Rehab as his insurance will not help cover his expenses. Encouraged patient to reach back out if anything changes in the future. Will cancel upcoming appointments.

## 2020-08-03 ENCOUNTER — Institutional Professional Consult (permissible substitution): Payer: 59 | Admitting: Pulmonary Disease

## 2020-08-14 ENCOUNTER — Other Ambulatory Visit: Payer: Self-pay

## 2020-08-14 DIAGNOSIS — M7918 Myalgia, other site: Secondary | ICD-10-CM

## 2020-08-14 DIAGNOSIS — G894 Chronic pain syndrome: Secondary | ICD-10-CM

## 2020-08-14 MED ORDER — GABAPENTIN 100 MG PO CAPS: 100.0000 mg | ORAL_CAPSULE | Freq: Three times a day (TID) | ORAL | 3 refills | Status: DC

## 2020-09-07 ENCOUNTER — Other Ambulatory Visit: Payer: Self-pay

## 2020-09-07 ENCOUNTER — Other Ambulatory Visit: Payer: Self-pay | Admitting: Family Medicine

## 2020-09-07 ENCOUNTER — Encounter: Payer: Self-pay | Admitting: Family Medicine

## 2020-09-07 ENCOUNTER — Ambulatory Visit (INDEPENDENT_AMBULATORY_CARE_PROVIDER_SITE_OTHER): Payer: 59 | Admitting: Family Medicine

## 2020-09-07 VITALS — BP 134/68 | HR 61 | Temp 96.8°F | Ht 69.0 in | Wt 174.8 lb

## 2020-09-07 DIAGNOSIS — F5104 Psychophysiologic insomnia: Secondary | ICD-10-CM

## 2020-09-07 DIAGNOSIS — E1169 Type 2 diabetes mellitus with other specified complication: Secondary | ICD-10-CM

## 2020-09-07 DIAGNOSIS — G894 Chronic pain syndrome: Secondary | ICD-10-CM

## 2020-09-07 DIAGNOSIS — G8929 Other chronic pain: Secondary | ICD-10-CM

## 2020-09-07 DIAGNOSIS — F331 Major depressive disorder, recurrent, moderate: Secondary | ICD-10-CM

## 2020-09-07 DIAGNOSIS — M7918 Myalgia, other site: Secondary | ICD-10-CM | POA: Diagnosis not present

## 2020-09-07 DIAGNOSIS — I10 Essential (primary) hypertension: Secondary | ICD-10-CM

## 2020-09-07 DIAGNOSIS — E785 Hyperlipidemia, unspecified: Secondary | ICD-10-CM

## 2020-09-07 DIAGNOSIS — R351 Nocturia: Secondary | ICD-10-CM

## 2020-09-07 DIAGNOSIS — Z125 Encounter for screening for malignant neoplasm of prostate: Secondary | ICD-10-CM

## 2020-09-07 DIAGNOSIS — Z0000 Encounter for general adult medical examination without abnormal findings: Secondary | ICD-10-CM

## 2020-09-07 DIAGNOSIS — I2510 Atherosclerotic heart disease of native coronary artery without angina pectoris: Secondary | ICD-10-CM

## 2020-09-07 LAB — POCT GLYCOSYLATED HEMOGLOBIN (HGB A1C): Hemoglobin A1C: 6.2 % — AB (ref 4.0–5.6)

## 2020-09-07 MED ORDER — GABAPENTIN 300 MG PO CAPS: 300.0000 mg | ORAL_CAPSULE | Freq: Every day | ORAL | 3 refills | Status: DC

## 2020-09-07 NOTE — Assessment & Plan Note (Signed)
Dramatic improvement A1c 9.8 down to 6.2 on improved med adherence metformin and liefstyle Complications - Hyperlipidemia and CAD, Retinopathy Strong fam history of DM Did not start GLP1  Encouraged by improved CBG  Plan:  1. Continue Metformin XR 750mg  x 2 = 1500mg  daily in AM 2. Continue ASA, ACEi, Statin  Future may reduce metformin to one daily if remains in low 6 range

## 2020-09-07 NOTE — Patient Instructions (Addendum)
Thank you for coming to the office today.  Recent Labs    04/18/20 0810 09/07/20 0817  HGBA1C 9.8* 6.2*   Great job on the blood sugar!  Future we can adjust metformin if need  Recommend Diabetic Eye exam from Dr Ellin Mayhew when you can   DUE for FASTING BLOOD WORK (no food or drink after midnight before the lab appointment, only water or coffee without cream/sugar on the morning of)  SCHEDULE "Lab Only" visit in the morning at the clinic for lab draw in 7 MONTHS   - Make sure Lab Only appointment is at about 1 week before your next appointment, so that results will be available  For Lab Results, once available within 2-3 days of blood draw, you can can log in to MyChart online to view your results and a brief explanation. Also, we can discuss results at next follow-up visit.   Please schedule a Follow-up Appointment to: Return in about 7 months (around 04/09/2021) for 7 month fasting lab only then 1 week later Annual Physical.  If you have any other questions or concerns, please feel free to call the office or send a message through [place]. You may also schedule an earlier appointment if necessary.  Additionally, you may be receiving a survey about your experience at our office within a few days to 1 week by e-mail or mail. We value your feedback.  Nobie Putnam, DO [place]

## 2020-09-07 NOTE — Progress Notes (Signed)
Subjective:    Patient ID: Clifford Morse, male    DOB: 1960-09-08, 60 y.o.   MRN: [id number]  Clifford Morse is a 60 y.o. male presenting on 09/07/2020 for Diabetes, Knee Pain (Pain has not improved), and Hip Pain (Pain has not improved)   HPI   DM, Type 2 / Possible neuropathy complication Previous Q3F up to 9.8 elevated, non adherence with med on metformin PM dosing Prior visit 03/2020 switched to Metformin XR 750 x 2 in AM He never tried Trulicity sample pen CBGs:AM Fasting sugar avg 88-120. No symptoms of hypoglycemia Meds:MetforminXR 750mg  x 2 = 1500mg  daily in AM Currently on ACEialready Lifestyle: - Diet (still improving low carb options) Last DM Eye exam 10/2018 showed retinopathy, Dr Ellin Mayhew, has not been back. History of occasional foot stinging symptoms  Chronic Pain Syndrome Chronic Hip / Knee pain Reports he has had chronic pain for long time. He has had worsening difficulty adapting to generalized pain, especially in both hips and knees. He has been on Duloxetine in past without results. He takes occasional OTC Ibuprofen, not consecutive days Never really managed by prior doctors. Has had prior x-ray knees 2014 unremarkable X-rays recently reviewed Since last visit 05/2020 he was given new rx Gabapentin 100mg  titration now he takes 100mg  x 3 for 300mg  nightly.  Denies hypoglycemia, polyuria, visual changes, numbness or tingling.  Additional update CAD S/p Left Heart Cath by Dr Fletcher Anon 06/19/20 He had another cardiac stent placed He was started on Rosuvastatin 20mg    Depression screen [facility] 2/9 04/26/2020 02/14/2020 04/21/2019  Decreased Interest 2 3 1   Down, Depressed, Hopeless 2 2 1   PHQ - 2 Score 4 5 2   Altered sleeping 1 1 0  Tired, decreased energy 3 3 2   Change in appetite 1 1 0  Feeling bad or failure about yourself  1 1 0  Trouble concentrating 1 1 0  Moving slowly or fidgety/restless 0 0 0  Suicidal thoughts 0 1 0  PHQ-9 Score 11 13 4    Difficult doing work/chores Not difficult at all Somewhat difficult Not difficult at all  Some recent data might be hidden    Social History   Tobacco Use  . Smoking status: Current Every Day Smoker    Packs/day: 1.50    Years: 25.00    Pack years: 37.50    Types: Cigarettes  . Smokeless tobacco: Former Systems developer  . Tobacco comment: since approx age 40  Vaping Use  . Vaping Use: Never used  Substance Use Topics  . Alcohol use: Yes    Alcohol/week: 0.0 standard drinks    Comment: rare  . Drug use: No    Review of Systems Per HPI unless specifically indicated above     Objective:    BP 134/68 (BP Location: Left Arm, Patient Position: Sitting, Cuff Size: Normal)   Pulse 61   Temp (!) 96.8 F (36 C) (Temporal)   Ht 5\' 9"  (1.753 m)   Wt 174 lb 12.8 oz (79.3 kg)   SpO2 99%   BMI 25.81 kg/m   Wt Readings from Last 3 Encounters:  09/07/20 174 lb 12.8 oz (79.3 kg)  06/27/20 174 lb (78.9 kg)  06/20/20 170 lb 11.2 oz (77.4 kg)    Physical Exam Vitals and nursing note reviewed.  Constitutional:      General: He is not in acute distress.    Appearance: He is well-developed. He is not diaphoretic.     Comments: Well-appearing, comfortable, cooperative  HENT:     Head: Normocephalic and atraumatic.  Eyes:     General:        Right eye: No discharge.        Left eye: No discharge.     Conjunctiva/sclera: Conjunctivae normal.  Neck:     Thyroid: No thyromegaly.  Cardiovascular:     Rate and Rhythm: Normal rate and regular rhythm.     Heart sounds: Normal heart sounds. No murmur heard.   Pulmonary:     Effort: Pulmonary effort is normal. No respiratory distress.     Breath sounds: Normal breath sounds. No wheezing or rales.  Musculoskeletal:        General: Normal range of motion.     Cervical back: Normal range of motion and neck supple.     Right lower leg: No edema.     Left lower leg: No edema.  Lymphadenopathy:     Cervical: No cervical adenopathy.  Skin:     General: Skin is warm and dry.     Findings: No erythema or rash.  Neurological:     Mental Status: He is alert and oriented to person, place, and time.  Psychiatric:        Behavior: Behavior normal.     Comments: Well groomed, good eye contact, normal speech and thoughts       Results for orders placed or performed in visit on 09/07/20  POCT glycosylated hemoglobin (Hb A1C)  Result Value Ref Range   Hemoglobin A1C 6.2 (A) 4.0 - 5.6 %      Assessment & Plan:   Problem List Items Addressed This Visit    Type 2 diabetes mellitus with other specified complication ([place]) - Primary    Dramatic improvement A1c 9.8 down to 6.2 on improved med adherence metformin and liefstyle Complications - Hyperlipidemia and CAD, Retinopathy Strong fam history of DM Did not start GLP1  Encouraged by improved CBG  Plan:  1. Continue Metformin XR 750mg  x 2 = 1500mg  daily in AM 2. Continue ASA, ACEi, Statin  Future may reduce metformin to one daily if remains in low 6 range      Relevant Orders   POCT glycosylated hemoglobin (Hb A1C) (Completed)   Chronic pain syndrome   Relevant Medications   gabapentin (NEURONTIN) 300 MG capsule   Chronic myofascial pain   Relevant Medications   gabapentin (NEURONTIN) 300 MG capsule       #Chronic myofascial vs Joint Pain History chronic pain syndrome Improved myofascial muscle pain on Gabapentin since last visit. Switch to Gabapentin 300mg  capsule nightly instead of 100mg  x 3 Still limited improvement on joint paint, he continues with current therapy. Currently functioning well, no new concerns   Meds ordered this encounter  Medications  . gabapentin (NEURONTIN) 300 MG capsule    Sig: Take 1 capsule (300 mg total) by mouth at bedtime.    Dispense:  90 capsule    Refill:  3    Dosage change to 300mg  capsule     Follow up plan: Return in about 7 months (around 04/09/2021) for 7 month fasting lab only then 1 week later Annual  Physical.  Future labs ordered for 04/06/21   Nobie Putnam, [facility] 09/07/2020, 8:14 AM

## 2020-09-14 ENCOUNTER — Institutional Professional Consult (permissible substitution): Payer: 59 | Admitting: Pulmonary Disease

## 2020-09-22 ENCOUNTER — Encounter: Payer: Self-pay | Admitting: Cardiology

## 2020-09-22 ENCOUNTER — Other Ambulatory Visit: Payer: Self-pay

## 2020-09-22 ENCOUNTER — Ambulatory Visit (INDEPENDENT_AMBULATORY_CARE_PROVIDER_SITE_OTHER): Payer: 59 | Admitting: Cardiology

## 2020-09-22 VITALS — BP 140/74 | HR 68 | Ht 69.0 in | Wt 175.0 lb

## 2020-09-22 DIAGNOSIS — I10 Essential (primary) hypertension: Secondary | ICD-10-CM | POA: Diagnosis not present

## 2020-09-22 DIAGNOSIS — E785 Hyperlipidemia, unspecified: Secondary | ICD-10-CM

## 2020-09-22 DIAGNOSIS — I2510 Atherosclerotic heart disease of native coronary artery without angina pectoris: Secondary | ICD-10-CM

## 2020-09-22 DIAGNOSIS — F17200 Nicotine dependence, unspecified, uncomplicated: Secondary | ICD-10-CM

## 2020-09-22 NOTE — Progress Notes (Signed)
Cardiology Office Note:    Date:  09/22/2020   ID:  Clifford Morse, DOB 03-May-1961, MRN [id number]  PCP:  Olin Hauser, DO  [place] Cardiologist:  Kate Sable, MD  [place] Electrophysiologist:  None   Referring MD: Nobie Putnam *   Chief Complaint  Patient presents with  . Other    3 month follow up. Meds reviewed verbally with patient.      History of Present Illness:    Clifford Morse is a 59 y.o. male with a hx of MI/CAD (s/p DES to mid LAD 03/2013, recent mid to distal LAD stent 06/19/2020), hypertension, hyperlipidemia, current smoker x35+ years, COPD who presents for follow-up.   Patient being seen due to CAD and hyperlipidemia.  Triglycerides were significantly elevated, started on fenofibrate.  Currently takes fenofibrate and Crestor 20 mg daily.  Was supposed to obtain fasting lipid profile but he forgot.  Denies any adverse effects from starting fenofibrate.  Denies chest pain, feels well.  He still smokes.   Prior notes Had an MI in 2014, drug-eluting stent was placed to the LAD. Left heart cath 06/19/2020 patent mid LAD stent, mid to distal LAD 80% disease status post DES. Mild left circumflex and RCA disease,  Echo 05/24/2020 showed normal systolic and diastolic function, EF 60 to 65%  Past Medical History:  Diagnosis Date  . Allergy   . Arthritis   . Asthma   . Chest pain on exertion   . Diabetes mellitus, type 2 ([place])   . GERD (gastroesophageal reflux disease)   . Hyperlipidemia   . Myocardial infarction ([place]) 2014  . Vertigo    with sinus issues    Past Surgical History:  Procedure Laterality Date  . CARDIAC CATHETERIZATION  2014   stent  . CORONARY STENT INTERVENTION  2014  . CORONARY STENT INTERVENTION N/A 06/19/2020   Procedure: CORONARY STENT INTERVENTION;  Surgeon: Wellington Hampshire, MD;  Location: [facility];  Service: Cardiovascular;  Laterality: N/A;  . ESOPHAGOGASTRODUODENOSCOPY    . HERNIA  REPAIR    . LEFT HEART CATH AND CORONARY ANGIOGRAPHY Left 06/19/2020   Procedure: LEFT HEART CATH AND CORONARY ANGIOGRAPHY;  Surgeon: Wellington Hampshire, MD;  Location: [facility];  Service: Cardiovascular;  Laterality: Left;    Current Medications: Current Meds  Medication Sig  . aspirin 81 MG tablet Take 81 mg by mouth daily.  . carvedilol (COREG) 25 MG tablet Take 1 tablet (25 mg total) by mouth 2 (two) times daily with a meal.  . clopidogrel (PLAVIX) 75 MG tablet Take 1 tablet (75 mg total) by mouth daily.  . DULoxetine (CYMBALTA) 60 MG capsule Take 1 capsule (60 mg total) by mouth daily.  . fenofibrate 160 MG tablet Take 1 tablet (160 mg total) by mouth daily.  . fluticasone (FLONASE) 50 MCG/ACT nasal spray Place 1 spray into Morse nostrils daily as needed for allergies or rhinitis.  Marland Kitchen gabapentin (NEURONTIN) 300 MG capsule Take 1 capsule (300 mg total) by mouth at bedtime.  Marland Kitchen lisinopril-hydrochlorothiazide (ZESTORETIC) 10-12.5 MG tablet Take 1 tablet by mouth daily.  . metFORMIN (GLUCOPHAGE XR) 750 MG 24 hr tablet Take 2 tablets (1,500 mg total) by mouth daily with breakfast.  . omega-3 acid ethyl esters (LOVAZA) 1 g capsule Take 1 capsule (1 g total) by mouth 2 (two) times daily.  . pantoprazole (PROTONIX) 40 MG tablet Take 1 tablet (40 mg total) by mouth daily before breakfast.  . pseudoephedrine-acetaminophen (  TYLENOL SINUS) 30-500 MG TABS tablet Take 2 tablets by mouth 2 (two) times daily as needed (congestion).  . rosuvastatin (CRESTOR) 20 MG tablet Take 1 tablet (20 mg total) by mouth daily.  . traZODone (DESYREL) 100 MG tablet TAKE 1 TABLET BY MOUTH AT BEDTIME     Allergies:   Patient has no known allergies.   Social History   Socioeconomic History  . Marital status: Married    Spouse name: Marcie Bal   . Number of children: 2  . Years of education: Not on file  . Highest education level: Not on file  Occupational History  . Not on file  Tobacco Use  . Smoking  status: Current Every Day Smoker    Packs/day: 1.50    Years: 25.00    Pack years: 37.50    Types: Cigarettes  . Smokeless tobacco: Former Systems developer  . Tobacco comment: since approx age 54  Vaping Use  . Vaping Use: Never used  Substance and Sexual Activity  . Alcohol use: Yes    Alcohol/week: 0.0 standard drinks    Comment: rare  . Drug use: No  . Sexual activity: Not on file  Other Topics Concern  . Not on file  Social History Narrative   Lives at home with wife   Social Determinants of Health   Financial Resource Strain: Not on file  Food Insecurity: Not on file  Transportation Needs: Not on file  Physical Activity: Not on file  Stress: Not on file  Social Connections: Not on file     Family History: The patient's family history includes Diabetes in his mother; Emphysema in his father.  ROS:   Please see the history of present illness.     All other systems reviewed and are negative.  EKGs/Labs/Other Studies Reviewed:    The following studies were reviewed today:   EKG:  EKG is  ordered today.  The ekg ordered today demonstrates normal sinus rhythm,  Recent Labs: 04/18/2020: ALT 47; TSH 1.14 06/20/2020: BUN 11; Creatinine, Ser 0.75; Hemoglobin 14.3; Platelets 284; Potassium 3.6; Sodium 136  Recent Lipid Panel    Component Value Date/Time   CHOL 172 04/18/2020 0810   CHOL 163 06/23/2015 0933   CHOL 209 (H) 02/28/2013 0324   TRIG 404 (H) 04/18/2020 0810   TRIG 247 (H) 02/28/2013 0324   HDL 38 (L) 04/18/2020 0810   HDL 41 06/23/2015 0933   HDL 38 (L) 02/28/2013 0324   CHOLHDL 4.5 04/18/2020 0810   VLDL 43 (H) 08/26/2016 0001   VLDL 49 (H) 02/28/2013 0324   LDLCALC  04/18/2020 0810     Comment:     . LDL cholesterol not calculated. Triglyceride levels greater than 400 mg/dL invalidate calculated LDL results. . Reference range: <100 . Desirable range <100 mg/dL for primary prevention;   <70 mg/dL for patients with CHD or diabetic patients  with > or = 2  CHD risk factors. Marland Kitchen LDL-C is now calculated using the Martin-Hopkins  calculation, which is a validated novel method providing  better accuracy than the Friedewald equation in the  estimation of LDL-C.  Cresenciano Genre et al. Annamaria Helling. 9798;921(19): 2061-2068  (http://education.QuestDiagnostics.com/faq/FAQ164)    LDLCALC 122 (H) 02/28/2013 0324     Risk Assessment/Calculations:      Physical Exam:    VS:  BP 140/74 (BP Location: Left Arm, Patient Position: Sitting, Cuff Size: Normal)   Pulse 68   Ht 5\' 9"  (1.753 m)   Wt 175 lb (79.4 kg)  SpO2 97%   BMI 25.84 kg/m     Wt Readings from Last 3 Encounters:  09/22/20 175 lb (79.4 kg)  09/07/20 174 lb 12.8 oz (79.3 kg)  06/27/20 174 lb (78.9 kg)     GEN:  Well nourished, well developed in no acute distress HEENT: Normal NECK: No JVD; No carotid bruits LYMPHATICS: No lymphadenopathy CARDIAC: RRR, no murmurs, rubs, gallops RESPIRATORY:  Clear to auscultation without rales, wheezing or rhonchi  ABDOMEN: Soft, non-tender, non-distended MUSCULOSKELETAL:  No edema; No deformity  SKIN: Warm and dry NEUROLOGIC:  Alert and oriented x 3 PSYCHIATRIC:  Normal affect   ASSESSMENT:    1. Coronary artery disease involving native coronary artery of native heart, unspecified whether angina present   2. Hyperlipidemia LDL goal <70   3. Primary hypertension   4. Smoking    PLAN:    In order of problems listed above:  1. Patient with history of CAD/MI s/p DES to LAD x 2.  Denies chest pain, continue aspirin, Plavix, Coreg, Crestor.  Last echo with preserved ejection fraction.  2. Hyperlipidemia, elevated triglycerides, goal LDL less than 70.  Obtain fasting lipid profile next week, plan to adjust cholesterol medications if not controlled.  Continue Crestor and fenofibrate as prescribed. 3. hypertension, BP controlled.  Continue Coreg, lisinopril, HCTZ as prescribed. 4. Patient is a current smoker, cessation advised.   Follow-up in 6 months      Shared Decision Making/Informed Consent      Medication Adjustments/Labs and Tests Ordered: Current medicines are reviewed at length with the patient today.  Concerns regarding medicines are outlined above.  Orders Placed This Encounter  Procedures  . Lipid panel  . EKG 12-Lead   No orders of the defined types were placed in this encounter.   Patient Instructions  Medication Instructions:   Your physician recommends that you continue on your current medications as directed. Please refer to the Current Medication list given to you today.  *If you need a refill on your cardiac medications before your next appointment, please call your pharmacy*   Lab Work:  Your physician recommends that you return for a FASTING lipid profile: in one week.  - You will need to be fasting. Please do not have anything to eat or drink after midnight the morning you have the lab work. You may only have water or black coffee with no cream or sugar. - Please go to the [facility] You will check in at the front desk to the right as you walk into the atrium. Valet Parking is offered if needed. - No appointment needed. You may go any day between 7 am and 6 pm.   Testing/Procedures:  None ordered   Follow-Up: At [facility], you and your health needs are our priority.  As part of our continuing mission to provide you with exceptional heart care, we have created designated Provider Care Teams.  These Care Teams include your primary Cardiologist (physician) and Advanced Practice Providers (APPs -  Physician Assistants and Nurse Practitioners) who all work together to provide you with the care you need, when you need it.  We recommend signing up for the patient portal called "MyChart".  Sign up information is provided on this After Visit Summary.  MyChart is used to connect with patients for Virtual Visits (Telemedicine).  Patients are able to view lab/test results, encounter notes, upcoming  appointments, etc.  Non-urgent messages can be sent to your provider as well.   To learn  more about what you can do with MyChart, go to [email or web address].    Your next appointment:   6 month(s)  The format for your next appointment:   In Person  Provider:   Kate Sable, MD   Other Instructions      Signed, Kate Sable, MD  09/22/2020 12:44 PM    [place]

## 2020-09-22 NOTE — Patient Instructions (Signed)
Medication Instructions:   Your physician recommends that you continue on your current medications as directed. Please refer to the Current Medication list given to you today.  *If you need a refill on your cardiac medications before your next appointment, please call your pharmacy*   Lab Work:  Your physician recommends that you return for a FASTING lipid profile: in one week.  - You will need to be fasting. Please do not have anything to eat or drink after midnight the morning you have the lab work. You may only have water or black coffee with no cream or sugar. - Please go to the [facility]. You will check in at the front desk to the right as you walk into the atrium. Valet Parking is offered if needed. - No appointment needed. You may go any day between 7 am and 6 pm.   Testing/Procedures:  None ordered   Follow-Up: At [facility], you and your health needs are our priority.  As part of our continuing mission to provide you with exceptional heart care, we have created designated Provider Care Teams.  These Care Teams include your primary Cardiologist (physician) and Advanced Practice Providers (APPs -  Physician Assistants and Nurse Practitioners) who all work together to provide you with the care you need, when you need it.  We recommend signing up for the patient portal called "MyChart".  Sign up information is provided on this After Visit Summary.  MyChart is used to connect with patients for Virtual Visits (Telemedicine).  Patients are able to view lab/test results, encounter notes, upcoming appointments, etc.  Non-urgent messages can be sent to your provider as well.   To learn more about what you can do with MyChart, go to [email or web address].    Your next appointment:   6 month(s)  The format for your next appointment:   In Person  Provider:   Kate Sable, MD   Other Instructions

## 2020-09-26 ENCOUNTER — Other Ambulatory Visit: Payer: Self-pay

## 2020-09-26 ENCOUNTER — Other Ambulatory Visit
Admission: RE | Admit: 2020-09-26 | Discharge: 2020-09-26 | Disposition: A | Discharge status: Home / Self Care | Payer: 59 | Attending: Cardiology | Admitting: Cardiology

## 2020-09-26 DIAGNOSIS — E785 Hyperlipidemia, unspecified: Secondary | ICD-10-CM | POA: Diagnosis present

## 2020-09-26 DIAGNOSIS — I2510 Atherosclerotic heart disease of native coronary artery without angina pectoris: Secondary | ICD-10-CM | POA: Diagnosis present

## 2020-09-26 LAB — LIPID PANEL
Cholesterol: 113 mg/dL (ref 0–200)
HDL: 45 mg/dL (ref >40)
LDL Cholesterol: 49 mg/dL (ref 0–99)
Total CHOL/HDL Ratio: 2.5 RATIO
Triglycerides: 96 mg/dL (ref <150)
VLDL: 19 mg/dL (ref 0–40)

## 2020-10-05 ENCOUNTER — Other Ambulatory Visit: Payer: Self-pay | Admitting: Family Medicine

## 2020-10-05 DIAGNOSIS — F331 Major depressive disorder, recurrent, moderate: Secondary | ICD-10-CM

## 2020-10-05 DIAGNOSIS — F5104 Psychophysiologic insomnia: Secondary | ICD-10-CM

## 2020-10-05 NOTE — Telephone Encounter (Signed)
Requested Prescriptions  Pending Prescriptions Disp Refills  . traZODone (DESYREL) 100 MG tablet [Pharmacy Med Name: traZODone HCl 100 MG Oral Tablet] 90 tablet 1    Sig: TAKE 1 TABLET BY MOUTH AT BEDTIME     Psychiatry: Antidepressants - Serotonin Modulator Passed - 10/05/2020  6:35 PM      Passed - Completed PHQ-2 or PHQ-9 in the last 360 days      Passed - Valid encounter within last 6 months    Recent Outpatient Visits          4 weeks ago Type 2 diabetes mellitus with other specified complication, without long-term current use of insulin ([place])   [facility], Devonne Doughty, DO   4 months ago Type 2 diabetes mellitus with other specified complication, without long-term current use of insulin [facility])   [place], DO   5 months ago Annual physical exam   [place], DO   7 months ago Major depressive disorder, recurrent, moderate ([place])   [place], DO   9 months ago Acute non-recurrent frontal sinusitis   [place], DO      Future Appointments            In 5 months Ag[place]Et[place], Aaron Edelman, MD [facility], [place]   In 6 months [place], Devonne Doughty, [facility], [facility]

## 2020-11-20 ENCOUNTER — Ambulatory Visit: Payer: 59 | Admitting: Internal Medicine

## 2020-12-01 ENCOUNTER — Ambulatory Visit: Payer: 59 | Admitting: Family Medicine

## 2020-12-19 ENCOUNTER — Other Ambulatory Visit: Payer: Self-pay

## 2020-12-19 ENCOUNTER — Encounter: Payer: Self-pay | Admitting: Pulmonary Disease

## 2020-12-19 ENCOUNTER — Ambulatory Visit (INDEPENDENT_AMBULATORY_CARE_PROVIDER_SITE_OTHER): Payer: 59 | Admitting: Pulmonary Disease

## 2020-12-19 VITALS — BP 138/74 | HR 83 | Temp 98.2°F | Ht 69.0 in | Wt 181.0 lb

## 2020-12-19 DIAGNOSIS — J449 Chronic obstructive pulmonary disease, unspecified: Secondary | ICD-10-CM | POA: Diagnosis not present

## 2020-12-19 DIAGNOSIS — R0602 Shortness of breath: Secondary | ICD-10-CM

## 2020-12-19 DIAGNOSIS — F17210 Nicotine dependence, cigarettes, uncomplicated: Secondary | ICD-10-CM

## 2020-12-19 DIAGNOSIS — K219 Gastro-esophageal reflux disease without esophagitis: Secondary | ICD-10-CM | POA: Diagnosis not present

## 2020-12-19 MED ORDER — TRELEGY ELLIPTA 200-62.5-25 MCG/INH IN AEPB: 1.0000 | INHALATION_SPRAY | Freq: Every day | RESPIRATORY_TRACT | 0 refills | Status: DC

## 2020-12-19 MED ORDER — TRELEGY ELLIPTA 200-62.5-25 MCG/INH IN AEPB: 1.0000 | INHALATION_SPRAY | Freq: Every day | RESPIRATORY_TRACT | 11 refills | Status: DC

## 2020-12-19 MED ORDER — TRELEGY ELLIPTA 200-62.5-25 MCG/INH IN AEPB: 1.0000 | INHALATION_SPRAY | Freq: Every day | RESPIRATORY_TRACT | Status: DC

## 2020-12-19 MED ORDER — ALBUTEROL SULFATE HFA 108 (90 BASE) MCG/ACT IN AERS: 2.0000 | INHALATION_SPRAY | Freq: Four times a day (QID) | RESPIRATORY_TRACT | 4 refills | Status: DC | PRN

## 2020-12-19 NOTE — Progress Notes (Signed)
Subjective:    Patient ID: Clifford Morse, male    DOB: [birth year], 60 y.o.   MRN: [id number] Chief Complaint  Patient presents with   pulmonary consult'    Hx of COPD. C/o sob with exertion, left side chest discomfort, occ wheezing and prod cough with tan sputum.     HPI Patient is a 60 year old current smoker (one half PPD) with a history as noted below, presents for evaluation of shortness of breath and left-sided chest discomfort.  He is kindly referred by Dr. Kate Sable.  His primary care physician is Dr. Nobie Putnam.  The patient notes that he has had dyspnea of longstanding.  He states that this has been "all of his life".  He states that walking fast, lifting heavy objects or in general getting his heart rate up will cause the dyspnea to ensue and worsen.  He states that rest will relieve that.  This is accompanied by sensation of tightness on the left chest.  In the past he has been evaluated and been told that he had asthma and emphysema and was given "an inhaler" but he cannot really call what type of inhaler.  He states that he read the potential side effects of the inhaler and he discontinued use.  He does not recall whether the inhaler helped him or not as this was many years ago.  He notes that he has cough productive of whitish sputum occasionally accompanied by dark flecks.  No hemoptysis.  Cough really has not changed in character over years.  Cough is worse in the mornings.  He does not endorse any orthopnea but he does have issues with paroxysmal nocturnal dyspnea.  No lower extremity edema.  Does note frequent heartburn and indigestion for which he takes Protonix.  Even with Protonix he still has some breakthrough symptoms.  He has seen Dr. Allen Norris in the past for this issue.  Has also apparently has had EGD for this in the distant past.  I do not see records of this.  He has not had any weight loss or anorexia.  He does not endorse any other symptomatology.  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  He has no prior [employer] history.   Review of Systems A 10 point review of systems was performed and it is as noted above otherwise negative.  Past Medical History:  Diagnosis Date   Allergy    Arthritis    Asthma    Chest pain on exertion    Diabetes mellitus, type 2 ([place])    GERD (gastroesophageal reflux disease)    Hyperlipidemia    Myocardial infarction ([place]) 2014   Vertigo    with sinus issues   Past Surgical History:  Procedure Laterality Date   CARDIAC CATHETERIZATION  2014   stent   CORONARY STENT INTERVENTION  2014   CORONARY STENT INTERVENTION N/A 06/19/2020   Procedure: CORONARY STENT INTERVENTION;  Surgeon: Wellington Hampshire, MD;  Location: [place] [facility];  Service: Cardiovascular;  Laterality: N/A;   ESOPHAGOGASTRODUODENOSCOPY     HERNIA REPAIR     LEFT HEART CATH AND CORONARY ANGIOGRAPHY Left 06/19/2020   Procedure: LEFT HEART CATH AND CORONARY ANGIOGRAPHY;  Surgeon: Wellington Hampshire, MD;  Location: [place] [facility];  Service: Cardiovascular;  Laterality: Left;   Family History  Problem Relation Age of Onset   Emphysema Father    Diabetes Mother    Social History   Tobacco Use  Smoking status: Every Day    Packs/day: 1.50    Years: 40.00    Pack years: 60.00    Types: Cigarettes   Smokeless tobacco: Former   Tobacco comments:    since approx age 69  Substance Use Topics   Alcohol use: Yes    Alcohol/week: 0.0 standard drinks    Comment: rare   No Known Allergies  Current Meds  Medication Sig   aspirin 81 MG tablet Take 81 mg by mouth daily.   carvedilol (COREG) 25 MG tablet Take 1 tablet (25 mg total) by mouth 2 (two) times daily with a meal.   clopidogrel (PLAVIX) 75 MG tablet Take 1 tablet (75 mg total) by mouth daily.   DULoxetine (CYMBALTA) 60 MG capsule Take 1 capsule (60 mg total) by mouth daily.   fenofibrate 160 MG tablet Take 1 tablet (160 mg total) by mouth daily.    fluticasone (FLONASE) 50 MCG/ACT nasal spray Place 1 spray into both nostrils daily as needed for allergies or rhinitis.   gabapentin (NEURONTIN) 300 MG capsule Take 1 capsule (300 mg total) by mouth at bedtime.   lisinopril-hydrochlorothiazide (ZESTORETIC) 10-12.5 MG tablet Take 1 tablet by mouth daily.   metFORMIN (GLUCOPHAGE XR) 750 MG 24 hr tablet Take 2 tablets (1,500 mg total) by mouth daily with breakfast.   omega-3 acid ethyl esters (LOVAZA) 1 g capsule Take 1 capsule (1 g total) by mouth 2 (two) times daily.   pantoprazole (PROTONIX) 40 MG tablet Take 1 tablet (40 mg total) by mouth daily before breakfast.   pseudoephedrine-acetaminophen (TYLENOL SINUS) 30-500 MG TABS tablet Take 2 tablets by mouth 2 (two) times daily as needed (congestion).   rosuvastatin (CRESTOR) 20 MG tablet Take 1 tablet (20 mg total) by mouth daily.   traZODone (DESYREL) 100 MG tablet TAKE 1 TABLET BY MOUTH AT BEDTIME   Immunization History  Administered Date(s) Administered   Pneumococcal Polysaccharide-23 07/01/2012   Tdap 07/01/2012       Objective:   Physical Exam BP 138/74 (BP Location: Left Arm, Cuff Size: Normal)   Pulse 83   Temp 98.2 F (36.8 C) (Temporal)   Ht 5\' 9"  (1.753 m)   Wt 181 lb (82.1 kg)   SpO2 98%   BMI 26.73 kg/m  GENERAL: Overweight, well-developed gentleman, no acute distress.  Fully ambulatory.  No conversational dyspnea. HEAD: Normocephalic, atraumatic.  EYES: Pupils equal, round, reactive to light.  No scleral icterus.  MOUTH: Nose/mouth/throat not examined due to masking requirements for COVID 19. NECK: Supple. No thyromegaly. Trachea midline. No JVD.  No adenopathy. PULMONARY: Good air entry bilaterally.  There are rhonchi, coarse sounds and scattered wheezes noted.   CARDIOVASCULAR: S1 and S2. Regular rate and rhythm.  ABDOMEN: Protuberant, otherwise benign. MUSCULOSKELETAL: No joint deformity, no clubbing, no edema.  NEUROLOGIC: No focal deficit, no gait disturbance,  speech is fluent. SKIN: Intact,warm,dry. PSYCH: Mood and behavior normal.    Assessment & Plan:     ICD-10-CM   1. COPD suggested by initial evaluation ([place])  J44.9 Pulmonary Function Test [facility] Only    CANCELED: Pulmonary Function Test [facility] Only   Will obtain PFTs Trial of Trelegy Ellipta 200/62.5/25, 1 inhalation daily Albuterol as needed Follow-up 2 to 3 months time    2. Shortness of breath  R06.02 Pulmonary Function Test [facility] Only    CANCELED: Pulmonary Function Test [facility] Only   PFTs as noted above Likely due to poorly compensated COPD    3. Gastroesophageal reflux disease,  unspecified whether esophagitis present  K21.9    Symptomatic despite PPI Recommended see Dr. Allen Norris for follow-up May need repeat EGD    4. Tobacco dependence due to cigarettes  F17.210 Ambulatory Referral for Lung Cancer Scre   Patient counseled regards to discontinuation of smoking  Total counseling time 3 to 5 minutes Referral to lung cancer screening program     Orders Placed This Encounter  Procedures   Ambulatory Referral for Lung Cancer Scre    Referral Priority:   Routine    Referral Type:   Consultation    Referral Reason:   Specialty Services Required    Number of Visits Requested:   1   Pulmonary Function Test [facility] Only    Standing Status:   Future    Standing Expiration Date:   12/19/2021    Scheduling Instructions:     12/2020    Order Specific Question:   Full PFT: includes the following: basic spirometry, spirometry pre & post bronchodilator, diffusion capacity (DLCO), lung volumes    Answer:   Full PFT   Meds ordered this encounter  Medications   Fluticasone-Umeclidin-Vilant (TRELEGY ELLIPTA) 200-62.5-25 MCG/INH AEPB    Sig: Inhale 1 puff into the lungs daily.    Dispense:  28 each    Refill:  0.   Fluticasone-Umeclidin-Vilant (TRELEGY ELLIPTA) 200-62.5-25 MCG/INH AEPB    Sig: Inhale 1 puff into the lungs daily.    Dispense:  14 each    Refill:  0    Fluticasone-Umeclidin-Vilant (TRELEGY ELLIPTA) 200-62.5-25 MCG/INH AEPB    Sig: Inhale 1 puff into the lungs daily.    Dispense:  28 each    Refill:  11   albuterol (VENTOLIN HFA) 108 (90 Base) MCG/ACT inhaler    Sig: Inhale 2 puffs into the lungs every 6 (six) hours as needed for wheezing or shortness of breath.    Dispense:  8 g    Refill:  4   Patient has poorly compensated COPD.  He has been counseled regards to discontinuation of smoking.  He also has issues with symptomatic gastroesophageal reflux despite proton pump inhibitor.  He states that he has seen Dr. Allen Norris in the past and has been rereferred to him.  I recommend that he follow-up in this regard as he may need EGD.  The patient has been given trial of Trelegy Ellipta as well as albuterol as needed.  PFTs will be obtained to further characterize the severity of COPD.  He also has been referred to the lung cancer screening program.  We will see him in follow-up in 2 to 3 months time he is to contact us prior to that time should any new difficulties arise.  Renold Don, MD [facility]   *This note was dictated using voice recognition software/Dragon.  Despite best efforts to proofread, errors can occur which can change the meaning.  Any change was purely unintentional.

## 2020-12-19 NOTE — Patient Instructions (Signed)
We are giving you a trial of a medication called Trelegy this will be 1 puff daily.  We sent in a prescription to your pharmacy which will be activated in 3 weeks.  You have enough for a month supply on the samples provided to you.  We are also providing you with a coupon so that your co-pay will be reduced significantly.  Make sure you rinse your mouth well after you use it.  We are going to schedule breathing test this will tell us about your lungs.  You have also had a prescription sent to your pharmacy for a rescue inhaler.  You can use this inhaler up to 4 times a day for shortness of breath, wheezing or congestion.  We will see him in follow-up in 2 to 3 months time call sooner should any new problems arise.

## 2020-12-27 ENCOUNTER — Other Ambulatory Visit: Payer: Self-pay | Admitting: Family Medicine

## 2020-12-27 DIAGNOSIS — E1169 Type 2 diabetes mellitus with other specified complication: Secondary | ICD-10-CM

## 2021-01-08 ENCOUNTER — Telehealth: Payer: Self-pay

## 2021-01-08 NOTE — Telephone Encounter (Signed)
Patient is aware of date/time of covid test and voiced his understanding.

## 2021-01-12 ENCOUNTER — Other Ambulatory Visit
Admission: RE | Admit: 2021-01-12 | Discharge: 2021-01-12 | Disposition: A | Discharge status: Home / Self Care | Payer: 59 | Source: Ambulatory Visit | Attending: Pulmonary Disease | Admitting: Pulmonary Disease

## 2021-01-12 ENCOUNTER — Other Ambulatory Visit: Payer: Self-pay

## 2021-01-12 DIAGNOSIS — Z20822 Contact with and (suspected) exposure to covid-19: Secondary | ICD-10-CM | POA: Insufficient documentation

## 2021-01-12 DIAGNOSIS — Z01812 Encounter for preprocedural laboratory examination: Secondary | ICD-10-CM | POA: Diagnosis present

## 2021-01-12 LAB — SARS CORONAVIRUS 2 (TAT 6-24 HRS): SARS Coronavirus 2: NEGATIVE

## 2021-01-15 ENCOUNTER — Ambulatory Visit: Payer: 59 | Attending: Pulmonary Disease

## 2021-01-15 ENCOUNTER — Other Ambulatory Visit: Payer: Self-pay

## 2021-01-15 DIAGNOSIS — R0602 Shortness of breath: Secondary | ICD-10-CM | POA: Diagnosis not present

## 2021-01-15 DIAGNOSIS — J449 Chronic obstructive pulmonary disease, unspecified: Secondary | ICD-10-CM | POA: Diagnosis present

## 2021-01-15 MED ORDER — ALBUTEROL SULFATE (2.5 MG/3ML) 0.083% IN NEBU
2.5000 mg | INHALATION_SOLUTION | Freq: Once | RESPIRATORY_TRACT | Status: AC
  Administered 2021-01-15: 2.5 mg via RESPIRATORY_TRACT
  Filled 2021-01-15: qty 3

## 2021-01-16 DIAGNOSIS — D229 Melanocytic nevi, unspecified: Secondary | ICD-10-CM

## 2021-01-16 DIAGNOSIS — D492 Neoplasm of unspecified behavior of bone, soft tissue, and skin: Secondary | ICD-10-CM

## 2021-01-17 NOTE — Addendum Note (Signed)
Addended by: Olin Hauser on: 01/17/2021 11:12 AM   Modules accepted: Orders

## 2021-02-07 ENCOUNTER — Other Ambulatory Visit: Payer: Self-pay | Admitting: Family Medicine

## 2021-02-07 DIAGNOSIS — I2510 Atherosclerotic heart disease of native coronary artery without angina pectoris: Secondary | ICD-10-CM

## 2021-02-07 DIAGNOSIS — I10 Essential (primary) hypertension: Secondary | ICD-10-CM

## 2021-02-07 MED ORDER — CARVEDILOL 25 MG PO TABS
25.0000 mg | ORAL_TABLET | Freq: Two times a day (BID) | ORAL | 3 refills | Status: DC
Start: 2021-02-07 — End: 2021-04-13

## 2021-02-07 NOTE — Telephone Encounter (Signed)
   Notes to clinic:  Medication filled by a different provider  Review for refill  Also verify directions and qty    Requested Prescriptions  Pending Prescriptions Disp Refills   carvedilol (COREG) 25 MG tablet 90 tablet 3    Sig: Take 1 tablet (25 mg total) by mouth 2 (two) times daily with a meal.      Cardiovascular:  Beta Blockers Passed - 02/07/2021  9:11 AM      Passed - Last BP in normal range    BP Readings from Last 1 Encounters:  12/19/20 138/74          Passed - Last Heart Rate in normal range    Pulse Readings from Last 1 Encounters:  12/19/20 83          Passed - Valid encounter within last 6 months    Recent Outpatient Visits           5 months ago Type 2 diabetes mellitus with other specified complication, without long-term current use of insulin ([place])   [place], DO   8 months ago Type 2 diabetes mellitus with other specified complication, without long-term current use of insulin [facility])   [facility] [place], Devonne Doughty, DO   9 months ago Annual physical exam   [place], DO   11 months ago Major depressive disorder, recurrent, moderate ([place])   [facility] Olin Hauser, DO   1 year ago Acute non-recurrent frontal sinusitis   [facility] Olin Hauser, DO       Future Appointments             In 1 month Ag[place]-Et[place], Aaron Edelman, MD [facility]   In 2 months [place], Devonne Doughty, DO [facility], [facility]

## 2021-03-21 ENCOUNTER — Other Ambulatory Visit: Payer: Self-pay

## 2021-03-21 ENCOUNTER — Ambulatory Visit (INDEPENDENT_AMBULATORY_CARE_PROVIDER_SITE_OTHER): Payer: 59 | Admitting: Pulmonary Disease

## 2021-03-21 ENCOUNTER — Telehealth: Payer: Self-pay | Admitting: Cardiology

## 2021-03-21 ENCOUNTER — Encounter: Payer: Self-pay | Admitting: Pulmonary Disease

## 2021-03-21 VITALS — BP 124/70 | HR 83 | Temp 97.7°F | Ht 69.0 in | Wt 181.0 lb

## 2021-03-21 DIAGNOSIS — R0602 Shortness of breath: Secondary | ICD-10-CM

## 2021-03-21 DIAGNOSIS — F17210 Nicotine dependence, cigarettes, uncomplicated: Secondary | ICD-10-CM | POA: Diagnosis not present

## 2021-03-21 DIAGNOSIS — J449 Chronic obstructive pulmonary disease, unspecified: Secondary | ICD-10-CM | POA: Diagnosis not present

## 2021-03-21 MED ORDER — BREZTRI AEROSPHERE 160-9-4.8 MCG/ACT IN AERO: 2.0000 | INHALATION_SPRAY | Freq: Two times a day (BID) | RESPIRATORY_TRACT | 11 refills | Status: DC

## 2021-03-21 NOTE — Patient Instructions (Signed)
We have sent prescription for Breztri 2 puffs twice a day this is an inhaler similar to Trelegy.  We are providing you with a coupon for the inhaler.  We will see you in follow-up in 3 to 4 months time call sooner should any new problems arise.

## 2021-03-21 NOTE — Progress Notes (Signed)
Virtual Visit via Telephone Note   This visit type was conducted due to national recommendations for restrictions regarding the COVID-19 Pandemic (e.g. social distancing) in an effort to limit this patient's exposure and mitigate transmission in our community.  Due to his co-morbid illnesses, this patient is at least at moderate risk for complications without adequate follow up.  This format is felt to be most appropriate for this patient at this time.  The patient did not have access to video technology/had technical difficulties with video requiring transitioning to audio format only (telephone).  All issues noted in this document were discussed and addressed.  No physical exam could be performed with this format.  Please refer to the patient's chart for his  consent to telehealth for [facility].    Date:  03/22/2021   ID:  Clifford Morse, DOB 1960-12-17, MRN [id number] The patient was identified using 2 identifiers.  Patient Location: Home Provider Location: Office/Clinic   PCP:  Clifford Morse, [place] Providers Cardiologist:  Clifford Sable, MD {   Evaluation Performed:  Follow-Up Visit  Chief Complaint:  Follow up  History of Present Illness:    Clifford Morse is a 60 y.o. male with CAD with MI in 2014 status post PCI/DES to the mid LAD, status post PCI/DES to the mid to distal LAD in 05/2020, HTN, HLD with hypertriglyceridemia, and COPD with ongoing tobacco use who presents for telehealth follow-up of his CAD.  He was previously followed by Dr. Saralyn Pilar,, last seeing him in 05/2015.  He subsequently transitioned his care to [facility].  He underwent Lexiscan MPI in 05/2020, which was inconclusive demonstrating a medium defect of moderate severity present in the mid anterior and apical anterior location.  Echo in 05/2020 showed an EF of 60 to 65%, no regional wall motion abnormalities, normal LV diastolic function parameters, normal RV systolic  function and ventricular cavity size, no significant valvular abnormalities, and an estimated right atrial pressure of 3 mmHg.  Given symptoms and intermediate risk nuclear stress test, he underwent diagnostic LHC in 05/2020 which showed significant one-vessel CAD with a patent stent in the mid LAD with mild in-stent restenosis.  There was a new 80% stenosis in the mid to distal LAD and 80% diffuse distal LAD disease close to the apex.  Severe subocclusive disease in the inferior branch of diagonal, which was small in diameter, was similar to prior angiography.  There was mild LCx and RCA disease.  He underwent successful PCI/DES to the mid to distal LAD.  He was last seen in the office in 08/2020 and was doing well from a cardiac perspective.    He is seen virtually today.  He reports a longstanding history of exertional chest tightness and shortness of breath that predate his PCI in 05/2020.  He notes his PCI at that time seem to help the symptoms a little bit, though they did not fully resolve.  He does report resolution of his exertional shortness of breath and chest tightness following the addition of Trelegy by pulmonology.  However, he was unable to afford this medication and he has subsequently been transitioned to [name].  Since starting Trelegy, he has been symptom free.  He is tolerating cardiac medications without issues.  No falls, hematochezia, or melena.   Labs personally reviewed: 08/2020 - TC 113, TG 96, HDL 45, LDL 49, A1c 6.2 05/2020 - Hgb 14.3, PLT 284, potassium 3.6, BUN 11, serum creatinine 0.75 03/2020 -  albumin 4.5, AST 41, ALT 47, TSH normal  The patient does not have symptoms concerning for COVID-19 infection (fever, chills, cough, or new shortness of breath).    Past Medical History:  Diagnosis Date   Allergy    Arthritis    Asthma    Chest pain on exertion    Diabetes mellitus, type 2 ([place])    GERD (gastroesophageal reflux disease)    Hyperlipidemia    Myocardial  infarction ([place]) 2014   Vertigo    with sinus issues   Past Surgical History:  Procedure Laterality Date   CARDIAC CATHETERIZATION  2014   stent   CORONARY STENT INTERVENTION  2014   CORONARY STENT INTERVENTION N/A 06/19/2020   Procedure: CORONARY STENT INTERVENTION;  Surgeon: Wellington Hampshire, MD;  Location: [facility];  Service: Cardiovascular;  Laterality: N/A;   ESOPHAGOGASTRODUODENOSCOPY     HERNIA REPAIR     LEFT HEART CATH AND CORONARY ANGIOGRAPHY Left 06/19/2020   Procedure: LEFT HEART CATH AND CORONARY ANGIOGRAPHY;  Surgeon: Wellington Hampshire, MD;  Location: [facility];  Service: Cardiovascular;  Laterality: Left;     Current Meds  Medication Sig   albuterol (VENTOLIN HFA) 108 (90 Base) MCG/ACT inhaler Inhale 2 puffs into the lungs every 6 (six) hours as needed for wheezing or shortness of breath.   aspirin 81 MG tablet Take 81 mg by mouth daily.   Budeson-Glycopyrrol-Formoterol (BREZTRI AEROSPHERE) 160-9-4.8 MCG/ACT AERO Inhale 2 puffs into the lungs in the morning and at bedtime.   carvedilol (COREG) 25 MG tablet Take 1 tablet (25 mg total) by mouth 2 (two) times daily with a meal.   clopidogrel (PLAVIX) 75 MG tablet Take 1 tablet (75 mg total) by mouth daily.   DULoxetine (CYMBALTA) 60 MG capsule Take 1 capsule (60 mg total) by mouth daily.   fenofibrate 160 MG tablet Take 1 tablet (160 mg total) by mouth daily.   fluticasone (FLONASE) 50 MCG/ACT nasal spray Place 1 spray into both nostrils daily as needed for allergies or rhinitis.   gabapentin (NEURONTIN) 300 MG capsule Take 1 capsule (300 mg total) by mouth at bedtime.   lisinopril-hydrochlorothiazide (ZESTORETIC) 10-12.5 MG tablet Take 1 tablet by mouth daily.   metFORMIN (GLUCOPHAGE-XR) 750 MG 24 hr tablet TAKE 2 TABLETS BY MOUTH ONCE DAILY WITH BREAKFAST   omega-3 acid ethyl esters (LOVAZA) 1 g capsule Take 1 capsule (1 g total) by mouth 2 (two) times daily.   pantoprazole (PROTONIX) 40 MG tablet  Take 1 tablet (40 mg total) by mouth daily before breakfast.   pseudoephedrine-acetaminophen (TYLENOL SINUS) 30-500 MG TABS tablet Take 2 tablets by mouth 2 (two) times daily as needed (congestion).   rosuvastatin (CRESTOR) 20 MG tablet Take 1 tablet (20 mg total) by mouth daily.   traZODone (DESYREL) 100 MG tablet TAKE 1 TABLET BY MOUTH AT BEDTIME     Allergies:   Patient has no known allergies.   Social History   Tobacco Use   Smoking status: Every Day    Packs/day: 2.00    Years: 40.00    Pack years: 80.00    Types: Cigarettes   Smokeless tobacco: Former   Tobacco comments:    1PPD   Vaping Use   Vaping Use: Never used  Substance Use Topics   Alcohol use: Yes    Alcohol/week: 0.0 standard drinks    Comment: rare   Drug use: No     Family Hx: The patient's family history includes  Diabetes in his mother; Emphysema in his father.  ROS:   Please see the history of present illness.     All other systems reviewed and are negative.   Prior CV studies:   The following studies were reviewed today:  LHC 05/2020: The left ventricular systolic function is normal. LV end diastolic pressure is mildly elevated. The left ventricular ejection fraction is 55-65% by visual estimate. Prox Cx to Dist Cx lesion is 30% stenosed. Prox LAD lesion is 30% stenosed. Prox RCA to Mid RCA lesion is 30% stenosed. Lat 2nd Diag lesion is 99% stenosed. Mid LAD lesion is 30% stenosed. Mid LAD to Dist LAD lesion is 80% stenosed. Post intervention, there is a 0% residual stenosis. A drug-eluting stent was successfully placed using a Ferguson [id number]. Dist LAD lesion is 80% stenosed.   1.  Significant one-vessel coronary artery disease with patent stent in the mid LAD with mild in-stent restenosis, new 80% stenosis in the mid to distal LAD and 80% diffuse distal LAD disease close to the apex.  Severe subocclusive disease in the inferior branch of diagonal which is small in diameter and  similar to previous angiography.  Mild left circumflex and RCA disease. 2.  Normal LV systolic function mildly elevated left ventricular end-diastolic pressure. 3.  Successful drug-eluting stent placement to the mid/distal LAD.   Recommendations: Dual antiplatelet therapy for at least 6 months.  The distal LAD disease close to the apex will be treated medically. Recommend aggressive treatment of risk factors. The patient needs to be on a statin therapy regardless of his LDL and thus I started rosuvastatin. Likely discharge home tomorrow. __________  2D echo 05/2020: 1. Left ventricular ejection fraction, by estimation, is 60 to 65%. Left  ventricular ejection fraction by 3D volume is 59 %. The left ventricle has  normal function. The left ventricle has no regional wall motion  abnormalities. Left ventricular diastolic   parameters were normal.   2. Right ventricular systolic function is normal. The right ventricular  size is normal.   3. The mitral valve is normal in structure. No evidence of mitral valve  regurgitation. No evidence of mitral stenosis.   4. The aortic valve is normal in structure. Aortic valve regurgitation is  not visualized. No aortic stenosis is present.   5. The inferior vena cava is normal in size with greater than 50%  respiratory variability, suggesting right atrial pressure of 3 mmHg. __________  Carlton Adam MPI 05/2020: There was no ST segment deviation noted during stress. No T wave inversion was noted during stress. Defect 1: There is a medium defect of moderate severity present in the mid anterior and apical anterior location. This is an intermediate risk study. None attenuation correction images show fixed inferior wall defect consistent with diaphragm attenuation. Attenuation correction images show partially reversible mid to distal anterior wall infarct suggestive of LAD ischemia. CT attenuation images show LAD calcification and likely prior stent. This  is an inconclusive study. Correlate clinically.  Labs/Other Tests and Data Reviewed:    EKG:  No ECG reviewed.  Recent Labs: 04/18/2020: ALT 47; TSH 1.14 06/20/2020: BUN 11; Creatinine, Ser 0.75; Hemoglobin 14.3; Platelets 284; Potassium 3.6; Sodium 136   Recent Lipid Panel Lab Results  Component Value Date/Time   CHOL 113 09/26/2020 07:35 AM   CHOL 163 06/23/2015 09:33 AM   CHOL 209 (H) 02/28/2013 03:24 AM   TRIG 96 09/26/2020 07:35 AM   TRIG 247 (H) 02/28/2013 03:24 AM  HDL 45 09/26/2020 07:35 AM   HDL 41 06/23/2015 09:33 AM   HDL 38 (L) 02/28/2013 03:24 AM   CHOLHDL 2.5 09/26/2020 07:35 AM   LDLCALC 49 09/26/2020 07:35 AM   LDLCALC  04/18/2020 08:10 AM     Comment:     . LDL cholesterol not calculated. Triglyceride levels greater than 400 mg/dL invalidate calculated LDL results. . Reference range: <100 . Desirable range <100 mg/dL for primary prevention;   <70 mg/dL for patients with CHD or diabetic patients  with > or = 2 CHD risk factors. Marland Kitchen LDL-C is now calculated using the Martin-Hopkins  calculation, which is a validated novel method providing  better accuracy than the Friedewald equation in the  estimation of LDL-C.  Cresenciano Genre et al. Annamaria Helling. 6203;559(74): 2061-2068  (http://education.[employer].com/faq/FAQ164)    LDLCALC 122 (H) 02/28/2013 03:24 AM    Wt Readings from Last 3 Encounters:  03/22/21 181 lb (82.1 kg)  03/21/21 181 lb (82.1 kg)  12/19/20 181 lb (82.1 kg)     Risk Assessment/Calculations:          Objective:    Vital Signs:  BP 124/70   Ht 5\' 9"  (1.753 m)   Wt 181 lb (82.1 kg)   BMI 26.73 kg/m      ASSESSMENT & PLAN:    CAD involving the native coronary arteries without angina: He is doing well without any symptoms concerning for angina.  Symptoms of chest tightness and shortness of breath fully resolved following the initiation of Trelegy.  He remains on aspirin and clopidogrel without interruption.  He will otherwise  continue carvedilol, rosuvastatin, and fenofibrate as outlined below.  Given resolution of symptoms following inhaler addition, we will defer further ischemic testing at this time.  He was advised to contact our office if symptoms return.  HTN: Blood pressure is well controlled at home.  He remains on carvedilol and lisinopril/HCTZ.  HLD: Triglyceride 96, LDL 49 in 08/2020.  AST/ALT mildly elevated in 2021.  He remains on rosuvastatin and fenofibrate.  Recommend obtaining LFTs at his next in-person visit.  Tobacco use with COPD: Complete cessation of tobacco is recommended.  Following PCI in 05/2020, he did note an improvement in his dyspnea, though not resolution of his symptoms.  Following initiation of Trelegy, he noted complete resolution of chest tightness and dyspnea, raising the question of a pulmonary component.  Recommend he follow-up with pulmonology as directed.       Time:   Today, I have spent 10 minutes with the patient with telehealth technology discussing the above problems.     Medication Adjustments/Labs and Tests Ordered: Current medicines are reviewed at length with the patient today.  Concerns regarding medicines are outlined above.   Tests Ordered: No orders of the defined types were placed in this encounter.   Medication Changes: No orders of the defined types were placed in this encounter.   Follow Up:  In Person in 6 month(s)  Signed, Christell Faith, PA-C  03/22/2021 4:05 PM    [facility]

## 2021-03-21 NOTE — Telephone Encounter (Signed)
  Patient Consent for Virtual Visit        Clifford Morse has provided verbal consent on 03/21/2021 for a virtual visit (video or telephone).   CONSENT FOR VIRTUAL VISIT FOR:  Clifford Morse  By participating in this virtual visit I agree to the following:  I hereby voluntarily request, consent and authorize [facility] and its employed or contracted physicians, physician assistants, nurse practitioners or other licensed health care professionals (the Practitioner), to provide me with telemedicine health care services (the "Services") as deemed necessary by the treating Practitioner. I acknowledge and consent to receive the Services by the Practitioner via telemedicine. I understand that the telemedicine visit will involve communicating with the Practitioner through live audiovisual communication technology and the disclosure of certain medical information by electronic transmission. I acknowledge that I have been given the opportunity to request an in-person assessment or other available alternative prior to the telemedicine visit and am voluntarily participating in the telemedicine visit.  I understand that I have the right to withhold or withdraw my consent to the use of telemedicine in the course of my care at any time, without affecting my right to future care or treatment, and that the Practitioner or I may terminate the telemedicine visit at any time. I understand that I have the right to inspect all information obtained and/or recorded in the course of the telemedicine visit and may receive copies of available information for a reasonable fee.  I understand that some of the potential risks of receiving the Services via telemedicine include:  Delay or interruption in medical evaluation due to technological equipment failure or disruption; Information transmitted may not be sufficient (e.g. poor resolution of images) to allow for appropriate medical decision making by the Practitioner; and/or   In rare instances, security protocols could fail, causing a breach of personal health information.  Furthermore, I acknowledge that it is my responsibility to provide information about my medical history, conditions and care that is complete and accurate to the best of my ability. I acknowledge that Practitioner's advice, recommendations, and/or decision may be based on factors not within their control, such as incomplete or inaccurate data provided by me or distortions of diagnostic images or specimens that may result from electronic transmissions. I understand that the practice of medicine is not an exact science and that Practitioner makes no warranties or guarantees regarding treatment outcomes. I acknowledge that a copy of this consent can be made available to me via my patient portal ([facility]), or I can request a printed copy by calling the office of [facility].    I understand that my insurance will be billed for this visit.   I have read or had this consent read to me. I understand the contents of this consent, which adequately explains the benefits and risks of the Services being provided via telemedicine.  I have been provided ample opportunity to ask questions regarding this consent and the Services and have had my questions answered to my satisfaction. I give my informed consent for the services to be provided through the use of telemedicine in my medical care

## 2021-03-21 NOTE — Progress Notes (Signed)
Subjective:    Patient ID: Clifford Morse, male    DOB: 09-08-1960, 60 y.o.   MRN: [id number] Chief Complaint  Patient presents with   Follow-up    Sob with exertion and dry cough at times prod with white sputum.     HPI Patient is a 60 year old current smoker (1 PPD) who presents for follow-up on the issue of dyspnea on exertion and cough.  He was initially evaluated on 19 December 2020.  Please refer to that note for details.  This is a follow-up scheduled visit.  He had pulmonary function testing performed on 15 January 2021 that shows only mild obstruction.  As a matter of fact his FEV1 is very well-preserved at 99% of predicted but he did show some evidence of air trapping and very mild hyperinflation.  He did not have significant bronchodilator response.  Diffusion capacity was mildly reduced.  As such the PFTs do not explain the severity of his dyspnea which is out of proportion to his actual function.  Recall the patient does have issues with coronary artery disease and is followed by cardiology for these issues.  Fortunately the patient continues to smoke which likely is not helping his symptoms overall.  Since his initial visit here he has not had any new difficulty.  Notes that Trelegy helps" plus minus".  No other complaints voiced.  Review of Systems A 10 point review of systems was performed and it is as noted above otherwise negative. Patient Active Problem List   Diagnosis Date Noted   Stable angina ([place]) 06/19/2020   Abnormal stress ECG    Chronic myofascial pain 06/06/2020   Psychophysiological insomnia 02/14/2020   Background diabetic retinopathy associated with type 2 diabetes mellitus ([place]) 11/13/2018   Anxiety 07/03/2018   Chronic pain syndrome 07/03/2018   Type 2 diabetes mellitus with other specified complication ([place]) 82/50/0370   Hyperlipidemia associated with type 2 diabetes mellitus ([place]) 05/28/2017   CAD (coronary artery disease) 03/21/2016   Skin lesion of face  12/07/2015   Seasonal allergies 12/07/2015   Tobacco abuse 06/20/2015   Major depressive disorder, recurrent, moderate ([place]) 06/20/2015   Fatty infiltration of liver 05/19/2015   GERD (gastroesophageal reflux disease) 05/19/2015   Essential hypertension 05/19/2015   Hx of cardiac catheterization 05/19/2015   Social History   Tobacco Use   Smoking status: Every Day    Packs/day: 2.00    Years: 40.00    Pack years: 80.00    Types: Cigarettes   Smokeless tobacco: Former   Tobacco comments:    1PPD   Substance Use Topics   Alcohol use: Yes    Alcohol/week: 0.0 standard drinks    Comment: rare   No Known Allergies  Current Meds  Medication Sig   albuterol (VENTOLIN HFA) 108 (90 Base) MCG/ACT inhaler Inhale 2 puffs into the lungs every 6 (six) hours as needed for wheezing or shortness of breath.   aspirin 81 MG tablet Take 81 mg by mouth daily.   carvedilol (COREG) 25 MG tablet Take 1 tablet (25 mg total) by mouth 2 (two) times daily with a meal.   clopidogrel (PLAVIX) 75 MG tablet Take 1 tablet (75 mg total) by mouth daily.   DULoxetine (CYMBALTA) 60 MG capsule Take 1 capsule (60 mg total) by mouth daily.   fenofibrate 160 MG tablet Take 1 tablet (160 mg total) by mouth daily.   fluticasone (FLONASE) 50 MCG/ACT nasal spray Place 1 spray into both nostrils daily as needed  for allergies or rhinitis.   gabapentin (NEURONTIN) 300 MG capsule Take 1 capsule (300 mg total) by mouth at bedtime.   lisinopril-hydrochlorothiazide (ZESTORETIC) 10-12.5 MG tablet Take 1 tablet by mouth daily.   metFORMIN (GLUCOPHAGE-XR) 750 MG 24 hr tablet TAKE 2 TABLETS BY MOUTH ONCE DAILY WITH BREAKFAST   omega-3 acid ethyl esters (LOVAZA) 1 g capsule Take 1 capsule (1 g total) by mouth 2 (two) times daily.   pantoprazole (PROTONIX) 40 MG tablet Take 1 tablet (40 mg total) by mouth daily before breakfast.   pseudoephedrine-acetaminophen (TYLENOL SINUS) 30-500 MG TABS tablet Take 2 tablets by mouth 2 (two)  times daily as needed (congestion).   rosuvastatin (CRESTOR) 20 MG tablet Take 1 tablet (20 mg total) by mouth daily.   traZODone (DESYREL) 100 MG tablet TAKE 1 TABLET BY MOUTH AT BEDTIME   [DISCONTINUED] Fluticasone-Umeclidin-Vilant (TRELEGY ELLIPTA) 200-62.5-25 MCG/INH AEPB Inhale 1 puff into the lungs daily.   [DISCONTINUED] Fluticasone-Umeclidin-Vilant (TRELEGY ELLIPTA) 200-62.5-25 MCG/INH AEPB Inhale 1 puff into the lungs daily.   [DISCONTINUED] Fluticasone-Umeclidin-Vilant (TRELEGY ELLIPTA) 200-62.5-25 MCG/INH AEPB Inhale 1 puff into the lungs daily.   Immunization History  Administered Date(s) Administered   Pneumococcal Polysaccharide-23 07/01/2012   Tdap 07/01/2012      Objective:   Physical Exam BP 124/70 (BP Location: Left Arm, Cuff Size: Normal)   Pulse 83   Temp 97.7 F (36.5 C) (Temporal)   Ht 5\' 9"  (1.753 m)   Wt 181 lb (82.1 kg)   SpO2 98%   BMI 26.73 kg/m   GENERAL: Overweight, well-developed gentleman, no acute distress.  Fully ambulatory.  No conversational dyspnea. HEAD: Normocephalic, atraumatic.  EYES: Pupils equal, round, reactive to light.  No scleral icterus.  MOUTH: Nose/mouth/throat not examined due to masking requirements for COVID 19. NECK: Supple. No thyromegaly. Trachea midline. No JVD.  No adenopathy. PULMONARY: Good air entry bilaterally.  There are rhonchi, coarse sounds and scattered wheezes noted.   CARDIOVASCULAR: S1 and S2. Regular rate and rhythm.  ABDOMEN: Protuberant, otherwise benign. MUSCULOSKELETAL: No joint deformity, no clubbing, no edema.  NEUROLOGIC: No focal deficit, no gait disturbance, speech is fluent. SKIN: Intact,warm,dry. PSYCH: Mood and behavior normal.     Assessment & Plan:     ICD-10-CM   1. Stage 1 mild COPD by GOLD classification ([place])  J44.9    Trial of Breztri 2 puffs twice a day Discontinue Trelegy     2. Shortness of breath  R06.02    Out of proportion to his PFTs Query cardiac component Continue  follow-up with cardiology    3. Tobacco dependence due to cigarettes  F17.210    He was referred to lung cancer screening at a prior visit Counseled regards to discontinuation of smoking      Meds ordered this encounter  Medications   Budeson-Glycopyrrol-Formoterol (BREZTRI AEROSPHERE) 160-9-4.8 MCG/ACT AERO    Sig: Inhale 2 puffs into the lungs in the morning and at bedtime.    Dispense:  10.7 g    Refill:  11   Patient will be given a trial of Breztri 2 puffs twice a day.  I am not too convinced that he will have dramatic improvement with this.  He will discontinue Trelegy.  His PFTs show only minimal to mild COPD stage I.  This by itself does not explain the severity of his dyspnea which may have a cardiac component.  He has been counseled regards discontinuation of smoking.  Recall that he was previously referred to the lung cancer  screening program and is awaiting his appointment for screening CT.  We will see him in follow-up in 3 to 4 months time he is to contact us prior to that time should any new difficulties arise.  It was also recommended that he discuss with his primary care physician Dr. Parks Ranger, with regards to updating on vaccinations.  Renold Don, MD Advanced Bronchoscopy PCCM [facility]    *This note was dictated using voice recognition software/Dragon.  Despite best efforts to proofread, errors can occur which can change the meaning.  Any change was purely unintentional.

## 2021-03-22 ENCOUNTER — Ambulatory Visit (INDEPENDENT_AMBULATORY_CARE_PROVIDER_SITE_OTHER): Payer: 59 | Admitting: Physician Assistant

## 2021-03-22 ENCOUNTER — Encounter: Payer: Self-pay | Admitting: *Deleted

## 2021-03-22 ENCOUNTER — Encounter: Payer: Self-pay | Admitting: Physician Assistant

## 2021-03-22 VITALS — BP 124/70 | Ht 69.0 in | Wt 181.0 lb

## 2021-03-22 DIAGNOSIS — I2510 Atherosclerotic heart disease of native coronary artery without angina pectoris: Secondary | ICD-10-CM | POA: Diagnosis not present

## 2021-03-22 DIAGNOSIS — F17200 Nicotine dependence, unspecified, uncomplicated: Secondary | ICD-10-CM

## 2021-03-22 DIAGNOSIS — E785 Hyperlipidemia, unspecified: Secondary | ICD-10-CM

## 2021-03-22 DIAGNOSIS — J449 Chronic obstructive pulmonary disease, unspecified: Secondary | ICD-10-CM | POA: Diagnosis not present

## 2021-03-22 DIAGNOSIS — I10 Essential (primary) hypertension: Secondary | ICD-10-CM

## 2021-03-22 NOTE — Patient Instructions (Addendum)
Medication Instructions:  Your physician recommends that you continue on your current medications as directed. Please refer to the Current Medication list given to you today.  *If you need a refill on your cardiac medications before your next appointment, please call your pharmacy*   Lab Work: None ordered  If you have labs (blood work) drawn today and your tests are completely normal, you will receive your results only by: [place] (if you have MyChart) OR A paper copy in the mail If you have any lab test that is abnormal or we need to change your treatment, we will call you to review the results.   Testing/Procedures: None ordered   Follow-Up: At [facility], you and your health needs are our priority.  As part of our continuing mission to provide you with exceptional heart care, we have created designated Provider Care Teams.  These Care Teams include your primary Cardiologist (physician) and Advanced Practice Providers (APPs -  Physician Assistants and Nurse Practitioners) who all work together to provide you with the care you need, when you need it.  We recommend signing up for the patient portal called "MyChart".  Sign up information is provided on this After Visit Summary.  MyChart is used to connect with patients for Virtual Visits (Telemedicine).  Patients are able to view lab/test results, encounter notes, upcoming appointments, etc.  Non-urgent messages can be sent to your provider as well.   To learn more about what you can do with MyChart, go to [email or web address].    Your next appointment:   6 month(s)  I HAVE MADE YOU A IN PERSON APPOINTMENT TO COME IN 09/20/2021 AT 3:30 AND SEE Clifford Faith, PA-C  The format for your next appointment:   In Person  Provider:   You may see Kate Sable, MD or one of the following Advanced Practice Providers on your designated Care Team:   Murray Hodgkins, NP Clifford Faith, PA-C Marrianne Mood, PA-C Cadence Kathlen Mody,  [place]   Other Instructions

## 2021-03-23 ENCOUNTER — Ambulatory Visit: Payer: 59 | Admitting: Cardiology

## 2021-04-06 ENCOUNTER — Other Ambulatory Visit: Payer: 59

## 2021-04-06 DIAGNOSIS — I2510 Atherosclerotic heart disease of native coronary artery without angina pectoris: Secondary | ICD-10-CM

## 2021-04-06 DIAGNOSIS — R351 Nocturia: Secondary | ICD-10-CM

## 2021-04-06 DIAGNOSIS — F331 Major depressive disorder, recurrent, moderate: Secondary | ICD-10-CM

## 2021-04-06 DIAGNOSIS — Z125 Encounter for screening for malignant neoplasm of prostate: Secondary | ICD-10-CM

## 2021-04-06 DIAGNOSIS — F5104 Psychophysiologic insomnia: Secondary | ICD-10-CM

## 2021-04-06 DIAGNOSIS — Z0000 Encounter for general adult medical examination without abnormal findings: Secondary | ICD-10-CM

## 2021-04-06 DIAGNOSIS — E785 Hyperlipidemia, unspecified: Secondary | ICD-10-CM

## 2021-04-06 DIAGNOSIS — E1169 Type 2 diabetes mellitus with other specified complication: Secondary | ICD-10-CM

## 2021-04-06 DIAGNOSIS — I10 Essential (primary) hypertension: Secondary | ICD-10-CM

## 2021-04-07 LAB — COMPLETE METABOLIC PANEL WITH GFR
AG Ratio: 2.1 (calc) (ref 1.0–2.5)
ALT: 41 U/L (ref 9–46)
AST: 26 U/L (ref 10–35)
Albumin: 4.6 g/dL (ref 3.6–5.1)
Alkaline phosphatase (APISO): 57 U/L (ref 35–144)
BUN: 14 mg/dL (ref 7–25)
CO2: 27 mmol/L (ref 20–32)
Calcium: 9.5 mg/dL (ref 8.6–10.3)
Chloride: 100 mmol/L (ref 98–110)
Creat: 0.76 mg/dL (ref 0.70–1.35)
Globulin: 2.2 g/dL (calc) (ref 1.9–3.7)
Glucose, Bld: 138 mg/dL — ABNORMAL HIGH (ref 65–99)
Potassium: 4.5 mmol/L (ref 3.5–5.3)
Sodium: 136 mmol/L (ref 135–146)
Total Bilirubin: 0.6 mg/dL (ref 0.2–1.2)
Total Protein: 6.8 g/dL (ref 6.1–8.1)
eGFR: 103 mL/min/{1.73_m2} (ref >=60)

## 2021-04-07 LAB — LIPID PANEL
Cholesterol: 193 mg/dL (ref <200)
HDL: 39 mg/dL — ABNORMAL LOW (ref >=40)
LDL Cholesterol (Calc): 123 mg/dL (calc) — ABNORMAL HIGH
Non-HDL Cholesterol (Calc): 154 mg/dL (calc) — ABNORMAL HIGH (ref <130)
Total CHOL/HDL Ratio: 4.9 (calc) (ref <5.0)
Triglycerides: 191 mg/dL — ABNORMAL HIGH (ref <150)

## 2021-04-07 LAB — HEMOGLOBIN A1C
Hgb A1c MFr Bld: 6.8 % of total Hgb — ABNORMAL HIGH (ref <5.7)
Mean Plasma Glucose: 148 mg/dL
eAG (mmol/L): 8.2 mmol/L

## 2021-04-07 LAB — CBC WITH DIFFERENTIAL/PLATELET
Absolute Monocytes: 456 cells/uL (ref 200–950)
Basophils Absolute: 82 cells/uL (ref 0–200)
Basophils Relative: 1.2 %
Eosinophils Absolute: 265 cells/uL (ref 15–500)
Eosinophils Relative: 3.9 %
HCT: 42.7 % (ref 38.5–50.0)
Hemoglobin: 14.2 g/dL (ref 13.2–17.1)
Lymphs Abs: 2400 cells/uL (ref 850–3900)
MCH: 31.1 pg (ref 27.0–33.0)
MCHC: 33.3 g/dL (ref 32.0–36.0)
MCV: 93.6 fL (ref 80.0–100.0)
MPV: 9.1 fL (ref 7.5–12.5)
Monocytes Relative: 6.7 %
Neutro Abs: 3597 cells/uL (ref 1500–7800)
Neutrophils Relative %: 52.9 %
Platelets: 260 10*3/uL (ref 140–400)
RBC: 4.56 10*6/uL (ref 4.20–5.80)
RDW: 12.1 % (ref 11.0–15.0)
Total Lymphocyte: 35.3 %
WBC: 6.8 10*3/uL (ref 3.8–10.8)

## 2021-04-07 LAB — TSH: TSH: 0.96 mIU/L (ref 0.40–4.50)

## 2021-04-07 LAB — PSA: PSA: 0.51 ng/mL (ref <=4.00)

## 2021-04-13 ENCOUNTER — Ambulatory Visit (INDEPENDENT_AMBULATORY_CARE_PROVIDER_SITE_OTHER): Payer: 59 | Admitting: Family Medicine

## 2021-04-13 ENCOUNTER — Encounter: Payer: Self-pay | Admitting: Family Medicine

## 2021-04-13 ENCOUNTER — Other Ambulatory Visit: Payer: Self-pay

## 2021-04-13 VITALS — BP 138/74 | HR 85 | Ht 69.0 in | Wt 183.8 lb

## 2021-04-13 DIAGNOSIS — Z0000 Encounter for general adult medical examination without abnormal findings: Secondary | ICD-10-CM

## 2021-04-13 DIAGNOSIS — F5104 Psychophysiologic insomnia: Secondary | ICD-10-CM

## 2021-04-13 DIAGNOSIS — E1169 Type 2 diabetes mellitus with other specified complication: Secondary | ICD-10-CM

## 2021-04-13 DIAGNOSIS — F331 Major depressive disorder, recurrent, moderate: Secondary | ICD-10-CM

## 2021-04-13 DIAGNOSIS — E785 Hyperlipidemia, unspecified: Secondary | ICD-10-CM

## 2021-04-13 DIAGNOSIS — K219 Gastro-esophageal reflux disease without esophagitis: Secondary | ICD-10-CM | POA: Diagnosis not present

## 2021-04-13 DIAGNOSIS — M7918 Myalgia, other site: Secondary | ICD-10-CM

## 2021-04-13 DIAGNOSIS — G8929 Other chronic pain: Secondary | ICD-10-CM

## 2021-04-13 DIAGNOSIS — I10 Essential (primary) hypertension: Secondary | ICD-10-CM

## 2021-04-13 DIAGNOSIS — M62830 Muscle spasm of back: Secondary | ICD-10-CM

## 2021-04-13 DIAGNOSIS — G894 Chronic pain syndrome: Secondary | ICD-10-CM

## 2021-04-13 DIAGNOSIS — I2510 Atherosclerotic heart disease of native coronary artery without angina pectoris: Secondary | ICD-10-CM

## 2021-04-13 DIAGNOSIS — J449 Chronic obstructive pulmonary disease, unspecified: Secondary | ICD-10-CM

## 2021-04-13 DIAGNOSIS — E113299 Type 2 diabetes mellitus with mild nonproliferative diabetic retinopathy without macular edema, unspecified eye: Secondary | ICD-10-CM

## 2021-04-13 MED ORDER — CARVEDILOL 25 MG PO TABS: 25.0000 mg | ORAL_TABLET | Freq: Two times a day (BID) | ORAL | 11 refills | Status: DC

## 2021-04-13 MED ORDER — DULOXETINE HCL 60 MG PO CPEP: 60.0000 mg | ORAL_CAPSULE | Freq: Every day | ORAL | 11 refills | Status: DC

## 2021-04-13 MED ORDER — GABAPENTIN 600 MG PO TABS
600.0000 mg | ORAL_TABLET | Freq: Every day | ORAL | 11 refills | Status: DC
Start: 2021-04-13 — End: 2022-04-19

## 2021-04-13 MED ORDER — TRAZODONE HCL 100 MG PO TABS: 100.0000 mg | ORAL_TABLET | Freq: Every day | ORAL | 11 refills | Status: DC

## 2021-04-13 MED ORDER — CYCLOBENZAPRINE HCL 10 MG PO TABS: 5.0000 mg | ORAL_TABLET | Freq: Three times a day (TID) | ORAL | 2 refills | Status: DC | PRN

## 2021-04-13 MED ORDER — PANTOPRAZOLE SODIUM 40 MG PO TBEC: 40.0000 mg | DELAYED_RELEASE_TABLET | Freq: Every day | ORAL | 11 refills | Status: DC

## 2021-04-13 MED ORDER — METFORMIN HCL ER 750 MG PO TB24: 750.0000 mg | ORAL_TABLET | Freq: Every day | ORAL | 11 refills | Status: DC

## 2021-04-13 NOTE — Progress Notes (Signed)
Subjective:    Patient ID: Clifford Morse, male    DOB: [birth year], 60 y.o.   MRN: [id number]  Clifford Morse is a 60 y.o. male presenting on 04/13/2021 for Annual Exam   HPI  Here for Annual Physical and Lab Review.  CHRONIC HTN: Reports home BP normal range 120s Current Meds - Carvedilol 70m BID, Lisinopril-HCTZ 10-12.558m  Reports good compliance, took meds today. Tolerating well, w/o complaints. Denies CP, dyspnea, HA, edema, dizziness / lightheadedness  DM, Type 2 / Possible neuropathy complication Last [id number], previous 7-9 range CBGs: AM Fasting sugar avg < 150No symptoms of hypoglycemia Meds: Metformin XR 75014maily in AM Currently on ACEi already Lifestyle: - Diet (still improving low carb options) History of occasional foot stinging symptoms   Chronic Pain Syndrome Chronic Hip / Knee pain Reports he has had chronic pain for long time. He has had worsening difficulty adapting to generalized pain, especially in both hips and knees. He has been on Duloxetine in past without results. He takes occasional OTC Ibuprofen, not consecutive days Never really managed by prior doctors. Has had prior x-ray knees 2014 unremarkable X-rays recently reviewed Since last visit 05/2020 he was given new rx Gabapentin 100m75mtration now he takes 100mg85m for 300mg 48mtly.   Denies hypoglycemia, polyuria, visual changes, numbness or tingling.   CAD S/p Left Heart Cath by Dr Arida Fletcher Anon/21 He had another cardiac stent placed He was started on Rosuvastatin 20mg  90mtrilobular Emphysema / COPD [facility] GonzalePatsey Bertholdusly on Trelegy and then switched [facility]  Low R mid back pain Reports he has had R mid back pain and spasm worse with movements, rotation and activities. He has used a back brace belt while at work.  Health Maintenance:  PSA 0.51 negative.  Depression screen PHQ [facility]/14/2022 04/26/2020 02/14/2020   Decreased Interest _0 Down, Depressed, Hopeless _1 PHQ - 2 Score _2 Altered sleeping _3 Tired, decreased energy _4 Change in appetite _5 Feeling bad or failure about yourself  0 1 1  Trouble concentrating _6 Moving slowly or fidgety/restless 0 0 0  Suicidal thoughts 0 0 1  PHQ-9 Score _7 Difficult doing work/chores Somewhat difficult Not difficult at all Somewhat difficult  Some recent data might be hidden    Past Medical History:  Diagnosis Date   Allergy    Arthritis    Asthma    Chest pain on exertion    Diabetes mellitus, type 2 (HCC)    GERD (gastroesophageal reflux disease)    Hyperlipidemia    Vertigo    with sinus issues   Past Surgical History:  Procedure Laterality Date   CARDIAC CATHETERIZATION  2014   stent   CORONARY STENT INTERVENTION  2014   CORONARY STENT INTERVENTION N/A 06/19/2020   Procedure: CORONARY STENT INTERVENTION;  Sur[facility] Arida, [place] [place];  Service: Cardiovascular;  Laterality: N/A;   ESOPHAGOGASTRODUODENOSCOPY     HERNIA REPAIR     LEFT HEART CATH AND CORONARY ANGIOGRAPHY Left 06/19/2020   Procedure: LEFT HEART CATH AND CORONARY ANGIOGRAPHY;  [facility] Arida[facility] [place] [place] [place];  Service: Cardiovascular;  Laterality: Left;   Social History   Socioeconomic History   Marital status: Married  Spouse name: Marcie Bal    Number of children: 2   Years of education: Not on file   Highest education level: Not on file  Occupational History   Not on file  Tobacco Use   Smoking status: Every Day    Packs/day: 2.00    Years: 40.00    Pack years: 80.00    Types: Cigarettes   Smokeless tobacco: Former   Tobacco comments:    1PPD   Vaping Use   Vaping Use: Never used  Substance and Sexual Activity   Alcohol use: Yes    Alcohol/week: 0.0 standard drinks    Comment: rare   Drug use: No   Sexual activity: Not on file  Other Topics Concern    Not on file  Social History Narrative   Lives at home with wife   Social Determinants of Health   Financial Resource Strain: Not on file  Food Insecurity: Not on file  Transportation Needs: Not on file  Physical Activity: Not on file  Stress: Not on file  Social Connections: Not on file  Intimate Partner Violence: Not on file   Family History  Problem Relation Age of Onset   Emphysema Father    Diabetes Mother    Current Outpatient Medications on File Prior to Visit  Medication Sig   albuterol (VENTOLIN HFA) 108 (90 Base) MCG/ACT inhaler Inhale 2 puffs into the lungs every 6 (six) hours as needed for wheezing or shortness of breath.   aspirin 81 MG tablet Take 81 mg by mouth daily.   Budeson-Glycopyrrol-Formoterol (BREZTRI AEROSPHERE) 160-9-4.8 MCG/ACT AERO Inhale 2 puffs into the lungs in the morning and at bedtime.   clopidogrel (PLAVIX) 75 MG tablet Take 1 tablet (75 mg total) by mouth daily.   fenofibrate 160 MG tablet Take 1 tablet (160 mg total) by mouth daily.   fluticasone (FLONASE) 50 MCG/ACT nasal spray Place 1 spray into both nostrils daily as needed for allergies or rhinitis.   lisinopril-hydrochlorothiazide (ZESTORETIC) 10-12.5 MG tablet Take 1 tablet by mouth daily.   omega-3 acid ethyl esters (LOVAZA) 1 g capsule Take 1 capsule (1 g total) by mouth 2 (two) times daily.   pseudoephedrine-acetaminophen (TYLENOL SINUS) 30-500 MG TABS tablet Take 2 tablets by mouth 2 (two) times daily as needed (congestion).   rosuvastatin (CRESTOR) 20 MG tablet Take 1 tablet (20 mg total) by mouth daily.   No current facility-administered medications on file prior to visit.    Review of Systems  Constitutional:  Negative for activity change, appetite change, chills, diaphoresis, fatigue and fever.  HENT:  Negative for congestion and hearing loss.   Eyes:  Negative for visual disturbance.  Respiratory:  Negative for cough, chest tightness, shortness of breath and wheezing.    Cardiovascular:  Negative for chest pain, palpitations and leg swelling.  Gastrointestinal:  Negative for abdominal pain, constipation, diarrhea, nausea and vomiting.  Genitourinary:  Negative for dysuria, frequency and hematuria.  Musculoskeletal:  Positive for back pain. Negative for arthralgias and neck pain.  Skin:  Negative for rash.  Neurological:  Negative for dizziness, weakness, light-headedness, numbness and headaches.  Hematological:  Negative for adenopathy.  Psychiatric/Behavioral:  Negative for behavioral problems, dysphoric mood and sleep disturbance.   Per HPI unless specifically indicated above      Objective:    BP 138/74 (BP Location: Left Arm, Cuff Size: Normal)   Pulse 85   Ht _0  (1.753 m)   Wt 183 lb 12.8 oz (83.4 kg)  SpO2 98%   BMI 27.14 kg/m   Wt Readings from Last 3 Encounters:  04/13/21 183 lb 12.8 oz (83.4 kg)  03/22/21 181 lb (82.1 kg)  03/21/21 181 lb (82.1 kg)    Physical Exam Vitals and nursing note reviewed.  Constitutional:      General: He is not in acute distress.    Appearance: He is well-developed. He is not diaphoretic.     Comments: Well-appearing, comfortable, cooperative  HENT:     Head: Normocephalic and atraumatic.  Eyes:     General:        Right eye: No discharge.        Left eye: No discharge.     Conjunctiva/sclera: Conjunctivae normal.     Pupils: Pupils are equal, round, and reactive to light.  Neck:     Thyroid: No thyromegaly.  Cardiovascular:     Rate and Rhythm: Normal rate and regular rhythm.     Pulses: Normal pulses.     Heart sounds: Normal heart sounds. No murmur heard. Pulmonary:     Effort: Pulmonary effort is normal. No respiratory distress.     Breath sounds: Normal breath sounds. No wheezing or rales.  Abdominal:     General: Bowel sounds are normal. There is no distension.     Palpations: Abdomen is soft. There is no mass.     Tenderness: There is no abdominal tenderness.  Musculoskeletal:         General: No tenderness. Normal range of motion.     Cervical back: Normal range of motion and neck supple.     Comments: Upper / Lower Extremities: - Normal muscle tone, strength bilateral upper extremities 5/5, lower extremities 5/5  Bilateral feet inner aspect medial ankle with bony protrusion  Lymphadenopathy:     Cervical: No cervical adenopathy.  Skin:    General: Skin is warm and dry.     Findings: No erythema or rash.  Neurological:     Mental Status: He is alert and oriented to person, place, and time.     Comments: Distal sensation intact to light touch all extremities  Psychiatric:        Mood and Affect: Mood normal.        Behavior: Behavior normal.        Thought Content: Thought content normal.     Comments: Well groomed, good eye contact, normal speech and thoughts    Diabetic Foot Exam - Simple   Simple Foot Form Diabetic Foot exam was performed with the following findings: Yes 04/13/2021  2:30 PM  Visual Inspection No deformities, no ulcerations, no other skin breakdown bilaterally: Yes Sensation Testing See comments: Yes Pulse Check Posterior Tibialis and Dorsalis pulse intact bilaterally: Yes Comments Some areas reduced monofilament sensation.      Results for orders placed or performed in visit on 04/06/21  TSH  Result Value Ref Range   TSH 0.96 0.40 - 4.50 mIU/L  Lipid panel  Result Value Ref Range   Cholesterol 193 <200 mg/dL   HDL 39 (L) > OR = 40 mg/dL   Triglycerides 191 (H) <150 mg/dL   LDL Cholesterol (Calc) 123 (H) mg/dL (calc)   Total CHOL/HDL Ratio 4.9 <5.0 (calc)   Non-HDL Cholesterol (Calc) 154 (H) <130 mg/dL (calc)  COMPLETE METABOLIC PANEL WITH GFR  Result Value Ref Range   Glucose, Bld 138 (H) 65 - 99 mg/dL   BUN 14 7 - 25 mg/dL   Creat 0.76 0.70 - 1.35 mg/dL  eGFR 103 > OR = 60 mL/min/1.50m   BUN/Creatinine Ratio NOT APPLICABLE 6 - 22 (calc)   Sodium 136 135 - 146 mmol/L   Potassium 4.5 3.5 - 5.3 mmol/L   Chloride 100  98 - 110 mmol/L   CO2 27 20 - 32 mmol/L   Calcium 9.5 8.6 - 10.3 mg/dL   Total Protein 6.8 6.1 - 8.1 g/dL   Albumin 4.6 3.6 - 5.1 g/dL   Globulin 2.2 1.9 - 3.7 g/dL (calc)   AG Ratio 2.1 1.0 - 2.5 (calc)   Total Bilirubin 0.6 0.2 - 1.2 mg/dL   Alkaline phosphatase (APISO) 57 35 - 144 U/L   AST 26 10 - 35 U/L   ALT 41 9 - 46 U/L  Hemoglobin A1c  Result Value Ref Range   Hgb A1c MFr Bld 6.8 (H) <5.7 % of total Hgb   Mean Plasma Glucose 148 mg/dL   eAG (mmol/L) 8.2 mmol/L  CBC with Differential/Platelet  Result Value Ref Range   WBC 6.8 3.8 - 10.8 Thousand/uL   RBC 4.56 4.20 - 5.80 Million/uL   Hemoglobin 14.2 13.2 - 17.1 g/dL   HCT 42.7 38.5 - 50.0 %   MCV 93.6 80.0 - 100.0 fL   MCH 31.1 27.0 - 33.0 pg   MCHC 33.3 32.0 - 36.0 g/dL   RDW 12.1 11.0 - 15.0 %   Platelets 260 140 - 400 Thousand/uL   MPV 9.1 7.5 - 12.5 fL   Neutro Abs 3,597 1,500 - 7,800 cells/uL   Lymphs Abs 2,400 850 - 3,900 cells/uL   Absolute Monocytes 456 200 - 950 cells/uL   Eosinophils Absolute 265 15 - 500 cells/uL   Basophils Absolute 82 0 - 200 cells/uL   Neutrophils Relative % 52.9 %   Total Lymphocyte 35.3 %   Monocytes Relative 6.7 %   Eosinophils Relative 3.9 %   Basophils Relative 1.2 %  PSA  Result Value Ref Range   PSA 0.51 < OR = 4.00 ng/mL      Assessment & Plan:   Problem List Items Addressed This Visit     Type 2 diabetes mellitus with other specified complication (HCC)    AF0Ystable at 6.8 Complications - Hyperlipidemia and CAD, Retinopathy Strong fam history of DM Did not start GLP1  Encouraged by improved CBG  Plan:  1. Continue Metformin XR 753mx 2 = 150066maily in AM 2. Continue ASA, ACEi, Statin  Future may reduce metformin to one daily if remains in low 6 range      Relevant Medications   metFORMIN (GLUCOPHAGE-XR) 750 MG 24 hr tablet   Stage 1 mild COPD by GOLD classification (HCC)    Followed by PulFatima Sanger Breztri      Psychophysiological insomnia   Relevant  Medications   traZODone (DESYREL) 100 MG tablet   Major depressive disorder, recurrent, moderate (HCC)    Improved mood Secondary anxiety, insomnia Failed Wellbutrin, Fluoxetine. Off Xanax  On duloxetine 70m59mazodone 100mg3mlow-up , may refer to therapist in future if insufficient improvement       Relevant Medications   DULoxetine (CYMBALTA) 60 MG capsule   traZODone (DESYREL) 100 MG tablet   Hyperlipidemia associated with type 2 diabetes mellitus (HCC)    TG down to 191, improved on rosuvastatin ASCVD with known CAD  Plan: 1. Continue Rosuvastatin 2. Continue DAPT Plavix + ASA 81mg 66msecondary ASCVD risk reduction 3. Encourage improved lifestyle - low carb/cholesterol, reduce portion size, continue improving regular  exercise      Relevant Medications   carvedilol (COREG) 25 MG tablet   metFORMIN (GLUCOPHAGE-XR) 750 MG 24 hr tablet   GERD (gastroesophageal reflux disease)   Relevant Medications   pantoprazole (PROTONIX) 40 MG tablet   Essential hypertension    Elevated BP, manual re-check improved - Home BP readings limited Complication with known CAD    Plan:  1. Continue current BP regimen - Carvedilol 45m BID, Lisinoipril-HCTZ 10-12.536mdaily 2. Encourage improved lifestyle - low sodium diet, regular exercise improve 3. May monitor BP outside office, bring readings to next visit, if persistently >140/90 or new symptoms notify office sooner      Relevant Medications   carvedilol (COREG) 25 MG tablet   Chronic pain syndrome   Relevant Medications   DULoxetine (CYMBALTA) 60 MG capsule   traZODone (DESYREL) 100 MG tablet   gabapentin (NEURONTIN) 600 MG tablet   cyclobenzaprine (FLEXERIL) 10 MG tablet   Chronic myofascial pain   Relevant Medications   DULoxetine (CYMBALTA) 60 MG capsule   traZODone (DESYREL) 100 MG tablet   gabapentin (NEURONTIN) 600 MG tablet   cyclobenzaprine (FLEXERIL) 10 MG tablet   CAD (coronary artery disease)    Chronic problem  with 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 On Plavix ASA, BB, ACEi, Statin      Relevant Medications   carvedilol (COREG) 25 MG tablet   Background diabetic retinopathy associated with type 2 diabetes mellitus ([place]   DM Retinopathy Will return for next DM Eye exam      Relevant Medications   metFORMIN (GLUCOPHAGE-XR) 750 MG 24 hr tablet   Other Visit Diagnoses     Annual physical exam    -  Primary   Moderate episode of recurrent major depressive disorder (HCC)       Relevant Medications   DULoxetine (CYMBALTA) 60 MG capsule   traZODone (DESYREL) 100 MG tablet   Back muscle spasm       Relevant Medications   cyclobenzaprine (FLEXERIL) 10 MG tablet       Updated Health Maintenance information Reviewed recent lab results with patient Encouraged improvement to lifestyle with diet and exercise Goal of weight loss  Major   Meds ordered this encounter  Medications   carvedilol (COREG) 25 MG tablet    Sig: Take 1 tablet (25 mg total) by mouth 2 (two) times daily with a meal.    Dispense:  60 tablet    Refill:  11   DULoxetine (CYMBALTA) 60 MG capsule    Sig: Take 1 capsule (60 mg total) by mouth daily.    Dispense:  30 capsule    Refill:  11   metFORMIN (GLUCOPHAGE-XR) 750 MG 24 hr tablet    Sig: Take 1 tablet (750 mg total) by mouth daily with breakfast.    Dispense:  30 tablet    Refill:  11   pantoprazole (PROTONIX) 40 MG tablet    Sig: Take 1 tablet (40 mg total) by mouth daily before breakfast.    Dispense:  30 tablet    Refill:  11   traZODone (DESYREL) 100 MG tablet    Sig: Take 1 tablet (100 mg total) by mouth at bedtime.    Dispense:  30 tablet    Refill:  11   gabapentin (NEURONTIN) 600 MG tablet    Sig: Take 1 tablet (600 mg total) by mouth at bedtime.    Dispense:  30 tablet    Refill:  11   cyclobenzaprine (  FLEXERIL) 10 MG tablet    Sig: Take 0.5-1 tablets (5-10 mg total) by mouth 3 (three) times daily as needed for muscle spasms.    Dispense:  60 tablet     Refill:  2      Follow up plan: Return in about 6 months (around 10/12/2021) for 6 month follow-up DM A1c, HTN, Pain / refills / mood.  Nobie Putnam, [facility] 04/13/2021, 2:14 PM

## 2021-04-13 NOTE — Patient Instructions (Addendum)
   Please schedule a Follow-up Appointment to: Return in about 6 months (around 10/12/2021) for 6 month follow-up DM A1c, HTN, Pain / refills / mood.  If you have any other questions or concerns, please feel free to call the office or send a message through [place]. You may also schedule an earlier appointment if necessary.  Additionally, you may be receiving a survey about your experience at our office within a few days to 1 week by e-mail or mail. We value your feedback.  Nobie Putnam, DO [place]

## 2021-04-14 ENCOUNTER — Encounter: Payer: Self-pay | Admitting: Family Medicine

## 2021-04-14 DIAGNOSIS — J449 Chronic obstructive pulmonary disease, unspecified: Secondary | ICD-10-CM | POA: Insufficient documentation

## 2021-04-14 NOTE — Assessment & Plan Note (Addendum)
Chronic problem with 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 On Plavix ASA, BB, ACEi, Statin

## 2021-04-14 NOTE — Assessment & Plan Note (Signed)
Followed by Fatima Sanger On Judithann Sauger

## 2021-04-14 NOTE — Assessment & Plan Note (Signed)
A1c stable at 6.8 Complications - Hyperlipidemia and CAD, Retinopathy Strong fam history of DM Did not start GLP1  Encouraged by improved CBG  Plan:  1. Continue Metformin XR 750mg  x 2 = 1500mg  daily in AM 2. Continue ASA, ACEi, Statin  Future may reduce metformin to one daily if remains in low 6 range

## 2021-04-14 NOTE — Assessment & Plan Note (Signed)
Elevated BP, manual re-check improved - Home BP readings limited Complication with known CAD    Plan:  1. Continue current BP regimen - Carvedilol 25mg  BID, Lisinoipril-HCTZ 10-12.5mg  daily 2. Encourage improved lifestyle - low sodium diet, regular exercise improve 3. May monitor BP outside office, bring readings to next visit, if persistently >140/90 or new symptoms notify office sooner

## 2021-04-14 NOTE — Assessment & Plan Note (Signed)
Improved mood Secondary anxiety, insomnia Failed Wellbutrin, Fluoxetine. Off Xanax  On duloxetine 60mg  Trazodone 100mg  Follow-up , may refer to therapist in future if insufficient improvement

## 2021-04-14 NOTE — Assessment & Plan Note (Addendum)
TG down to 191, improved on rosuvastatin ASCVD with known CAD  Plan: 1. Continue Rosuvastatin 2. Continue DAPT Plavix + ASA 81mg  for secondary ASCVD risk reduction 3. Encourage improved lifestyle - low carb/cholesterol, reduce portion size, continue improving regular exercise

## 2021-04-14 NOTE — Assessment & Plan Note (Signed)
DM Retinopathy Will return for next DM Eye exam

## 2021-07-04 ENCOUNTER — Telehealth: Payer: Self-pay | Admitting: Physician Assistant

## 2021-07-04 DIAGNOSIS — I10 Essential (primary) hypertension: Secondary | ICD-10-CM

## 2021-07-04 DIAGNOSIS — I2510 Atherosclerotic heart disease of native coronary artery without angina pectoris: Secondary | ICD-10-CM

## 2021-07-04 NOTE — Telephone Encounter (Signed)
°*  STAT* If patient is at the pharmacy, call can be transferred to refill team.   1. Which medications need to be refilled? (please list name of each medication and dose if known) rosuvastatin is out please also send any other cardiac med   2. Which pharmacy/location (including street and city if local pharmacy) is medication to be sent to?[facility] [address] [place]   3. Do they need a 30 day or 90 day supply? [place]

## 2021-07-05 MED ORDER — FENOFIBRATE 160 MG PO TABS: 160.0000 mg | ORAL_TABLET | Freq: Every day | ORAL | 0 refills | Status: DC

## 2021-07-05 MED ORDER — OMEGA-3-ACID ETHYL ESTERS 1 G PO CAPS: 1.0000 g | ORAL_CAPSULE | Freq: Two times a day (BID) | ORAL | 0 refills | Status: DC

## 2021-07-05 MED ORDER — CLOPIDOGREL BISULFATE 75 MG PO TABS: 75.0000 mg | ORAL_TABLET | Freq: Every day | ORAL | 0 refills | Status: DC

## 2021-07-05 MED ORDER — LISINOPRIL-HYDROCHLOROTHIAZIDE 10-12.5 MG PO TABS: 1.0000 | ORAL_TABLET | Freq: Every day | ORAL | 0 refills | Status: DC

## 2021-07-05 MED ORDER — ROSUVASTATIN CALCIUM 20 MG PO TABS: 20.0000 mg | ORAL_TABLET | Freq: Every day | ORAL | 0 refills | Status: DC

## 2021-09-17 NOTE — Progress Notes (Signed)
? ?Cardiology Office Note   ? ?Date:  09/19/2021  ? ?ID:  Clifford Morse, DOB 1961-01-04, MRN [id number] ? ?PCP:  Olin Hauser, DO  ?Cardiologist:  Kate Sable, MD  ?Electrophysiologist:  None  ? ?Chief Complaint: Follow up ? ?History of Present Illness:  ? ?Clifford Morse is a 61 y.o. male with history of CAD with MI in 2014 status post PCI/DES to the mid LAD, status post PCI/DES to the mid to distal LAD in 05/2020, HTN, HLD with hypertriglyceridemia, and COPD with ongoing tobacco use who presents for follow-up of his CAD. ?  ?He was previously followed by Dr. Saralyn Pilar, last seeing him in 05/2015.  He subsequently transitioned his care to [facility].  He underwent Lexiscan MPI in 05/2020, which was inconclusive demonstrating a medium defect of moderate severity present in the mid anterior and apical anterior location.  Echo in 05/2020 showed an EF of 60 to 65%, no regional wall motion abnormalities, normal LV diastolic function parameters, normal RV systolic function and ventricular cavity size, no significant valvular abnormalities, and an estimated right atrial pressure of 3 mmHg.  Given symptoms and intermediate risk nuclear stress test, he underwent diagnostic LHC in 05/2020 which showed significant one-vessel CAD with a patent stent in the mid LAD with mild in-stent restenosis.  There was a new 80% stenosis in the mid to distal LAD and 80% diffuse distal LAD disease close to the apex.  Severe subocclusive disease in the inferior branch of diagonal, which was small in diameter, was similar to prior angiography.  There was mild LCx and RCA disease.  He underwent successful PCI/DES to the mid to distal LAD. ?  ?He was last seen virtually in 03/2021 and reported a long history of exertional chest tightness and shortness of breath that predated his PCI in 05/2020.  He did report at the time of his PCI his symptoms seem to improve a little bit, though they did not fully resolve.  He reported  resolution of exertional shortness of breath and chest tightness following the addition of Trelegy by pulmonology.  However, he was unable to afford this medication and was subsequently transitioned to [place]. ? ?He comes in today and is doing well without symptoms concerning for angina or decompensation.  He does note some mild positional dizziness if he stands up quickly from a squatting position, otherwise denies any presyncope or syncope.  He has had some mechanical falls due to orthopedic issues.  No hematochezia or melena.  He is tolerating cardiac medications without issues.  He does continue to smoke 1 pack/day and has previously purchased nicotine patches, with the box remaining on open.  He is not yet ready to quit.  Overall, he feels like he is doing very well from a cardiac perspective and does not have any issues or concerns at this time. ? ? ?Labs independently reviewed: ?03/2021 - Hgb 14.2, PLT 260, A1c 6.8, BUN 14, serum creatinine 0.76, potassium 4.5, albumin 4.6, AST/ALT normal, TC 193, TG 191, HDL 39, LDL 123, TSH normal ? ?Past Medical History:  ?Diagnosis Date  ? Allergy   ? Arthritis   ? Asthma   ? Chest pain on exertion   ? Diabetes mellitus, type 2 ([place])   ? GERD (gastroesophageal reflux disease)   ? Hyperlipidemia   ? Vertigo   ? with sinus issues  ? ? ?Past Surgical History:  ?Procedure Laterality Date  ? CARDIAC CATHETERIZATION  2014  ? stent  ? CORONARY  STENT INTERVENTION  2014  ? CORONARY STENT INTERVENTION N/A 06/19/2020  ? Procedure: CORONARY STENT INTERVENTION;  Surgeon: Wellington Hampshire, MD;  Location: [place] [facility];  Service: Cardiovascular;  Laterality: N/A;  ? ESOPHAGOGASTRODUODENOSCOPY    ? HERNIA REPAIR    ? LEFT HEART CATH AND CORONARY ANGIOGRAPHY Left 06/19/2020  ? Procedure: LEFT HEART CATH AND CORONARY ANGIOGRAPHY;  Surgeon: Wellington Hampshire, MD;  Location: [place] [facility];  Service: Cardiovascular;  Laterality: Left;  ? ? ?Current Medications: ?Current Meds   ?Medication Sig  ? albuterol (VENTOLIN HFA) 108 (90 Base) MCG/ACT inhaler Inhale 2 puffs into the lungs every 6 (six) hours as needed for wheezing or shortness of breath.  ? aspirin 81 MG tablet Take 81 mg by mouth daily.  ? carvedilol (COREG) 25 MG tablet Take 1 tablet (25 mg total) by mouth 2 (two) times daily with a meal.  ? clopidogrel (PLAVIX) 75 MG tablet Take 1 tablet (75 mg total) by mouth daily.  ? cyclobenzaprine (FLEXERIL) 10 MG tablet Take 0.5-1 tablets (5-10 mg total) by mouth 3 (three) times daily as needed for muscle spasms.  ? DULoxetine (CYMBALTA) 60 MG capsule Take 1 capsule (60 mg total) by mouth daily.  ? fenofibrate 160 MG tablet Take 1 tablet (160 mg total) by mouth daily.  ? fluticasone (FLONASE) 50 MCG/ACT nasal spray Place 1 spray into both nostrils daily as needed for allergies or rhinitis.  ? gabapentin (NEURONTIN) 600 MG tablet Take 1 tablet (600 mg total) by mouth at bedtime.  ? lisinopril-hydrochlorothiazide (ZESTORETIC) 10-12.5 MG tablet Take 1 tablet by mouth daily.  ? metFORMIN (GLUCOPHAGE-XR) 750 MG 24 hr tablet Take 1 tablet (750 mg total) by mouth daily with breakfast.  ? omega-3 acid ethyl esters (LOVAZA) 1 g capsule Take 1 capsule (1 g total) by mouth 2 (two) times daily.  ? pantoprazole (PROTONIX) 40 MG tablet Take 1 tablet (40 mg total) by mouth daily before breakfast.  ? pseudoephedrine-acetaminophen (TYLENOL SINUS) 30-500 MG TABS tablet Take 2 tablets by mouth 2 (two) times daily as needed (congestion).  ? rosuvastatin (CRESTOR) 20 MG tablet Take 1 tablet (20 mg total) by mouth daily.  ? traZODone (DESYREL) 100 MG tablet Take 1 tablet (100 mg total) by mouth at bedtime.  ? ? ?Allergies:   Patient has no known allergies.  ? ?Social History  ? ?Socioeconomic History  ? Marital status: Married  ?  Spouse name: Marcie Bal   ? Number of children: 2  ? Years of education: Not on file  ? Highest education level: Not on file  ?Occupational History  ? Not on file  ?Tobacco Use  ? Smoking  status: Every Day  ?  Packs/day: 2.00  ?  Years: 40.00  ?  Pack years: 80.00  ?  Types: Cigarettes  ? Smokeless tobacco: Former  ? Tobacco comments:  ?  1PPD   ?Vaping Use  ? Vaping Use: Never used  ?Substance and Sexual Activity  ? Alcohol use: Yes  ?  Alcohol/week: 0.0 standard drinks  ?  Comment: rare  ? Drug use: No  ? Sexual activity: Not on file  ?Other Topics Concern  ? Not on file  ?Social History Narrative  ? Lives at home with wife  ? ?Social Determinants of Health  ? ?Financial Resource Strain: Not on file  ?Food Insecurity: Not on file  ?Transportation Needs: Not on file  ?Physical Activity: Not on file  ?Stress: Not on file  ?Social  Connections: Not on file  ?  ? ?Family History:  ?The patient's family history includes Diabetes in his mother; Emphysema in his father. ? ?ROS:   ?12-point review of system is negative unless otherwise noted in the HPI. ? ? ?EKGs/Labs/Other Studies Reviewed:   ? ?Studies reviewed were summarized above. The additional studies were reviewed today: ? ?LHC 05/2020: ?The left ventricular systolic function is normal. ?LV end diastolic pressure is mildly elevated. ?The left ventricular ejection fraction is 55-65% by visual estimate. ?Prox Cx to Dist Cx lesion is 30% stenosed. ?Prox LAD lesion is 30% stenosed. ?Prox RCA to Mid RCA lesion is 30% stenosed. ?Lat 2nd Diag lesion is 99% stenosed. ?Mid LAD lesion is 30% stenosed. ?Mid LAD to Dist LAD lesion is 80% stenosed. ?Post intervention, there is a 0% residual stenosis. ?A drug-eluting stent was successfully placed using a [place] [id number]. ?Dist LAD lesion is 80% stenosed. ?  ?1.  Significant one-vessel coronary artery disease with patent stent in the mid LAD with mild in-stent restenosis, new 80% stenosis in the mid to distal LAD and 80% diffuse distal LAD disease close to the apex.  Severe subocclusive disease in the inferior branch of diagonal which is small in diameter and similar to previous angiography.  Mild left  circumflex and RCA disease. ?2.  Normal LV systolic function mildly elevated left ventricular end-diastolic pressure. ?3.  Successful drug-eluting stent placement to the mid/distal LAD. ?  ?Recommendations

## 2021-09-19 ENCOUNTER — Other Ambulatory Visit: Payer: Self-pay

## 2021-09-19 ENCOUNTER — Encounter: Payer: Self-pay | Admitting: Physician Assistant

## 2021-09-19 ENCOUNTER — Ambulatory Visit (INDEPENDENT_AMBULATORY_CARE_PROVIDER_SITE_OTHER): Payer: 59 | Admitting: Physician Assistant

## 2021-09-19 VITALS — BP 140/78 | HR 88 | Ht 69.0 in | Wt 187.1 lb

## 2021-09-19 DIAGNOSIS — I2510 Atherosclerotic heart disease of native coronary artery without angina pectoris: Secondary | ICD-10-CM

## 2021-09-19 DIAGNOSIS — R69 Illness, unspecified: Secondary | ICD-10-CM | POA: Diagnosis not present

## 2021-09-19 DIAGNOSIS — J449 Chronic obstructive pulmonary disease, unspecified: Secondary | ICD-10-CM

## 2021-09-19 DIAGNOSIS — E785 Hyperlipidemia, unspecified: Secondary | ICD-10-CM | POA: Diagnosis not present

## 2021-09-19 DIAGNOSIS — F17200 Nicotine dependence, unspecified, uncomplicated: Secondary | ICD-10-CM | POA: Diagnosis not present

## 2021-09-19 DIAGNOSIS — IMO0001 Reserved for inherently not codable concepts without codable children: Secondary | ICD-10-CM

## 2021-09-19 DIAGNOSIS — I10 Essential (primary) hypertension: Secondary | ICD-10-CM | POA: Diagnosis not present

## 2021-09-19 NOTE — Patient Instructions (Signed)
Medication Instructions:  ?No changes at this time. ? ?*If you need a refill on your cardiac medications before your next appointment, please call your pharmacy* ? ? ?Lab Work: ?Lipid & Liver panel. Please make sure NOT to eat or drink anything after midnight except sip of water with your pills. Go to [facility] and check in at registration.  ? ?If you have labs (blood work) drawn today and your tests are completely normal, you will receive your results only by: ?MyChart Message (if you have MyChart) OR ?A paper copy in the mail ?If you have any lab test that is abnormal or we need to change your treatment, we will call you to review the results. ? ? ?Testing/Procedures: ?None ? ? ?Follow-Up: ?At [facility], you and your health needs are our priority.  As part of our continuing mission to provide you with exceptional heart care, we have created designated Provider Care Teams.  These Care Teams include your primary Cardiologist (physician) and Advanced Practice Providers (APPs -  Physician Assistants and Nurse Practitioners) who all work together to provide you with the care you need, when you need it. ? ? ?Your next appointment:   ?6 month(s) ? ?The format for your next appointment:   ?In Person ? ?Provider:   ?Kate Sable, MD or Christell Faith, PA-C ?

## 2021-10-08 ENCOUNTER — Other Ambulatory Visit
Admission: RE | Admit: 2021-10-08 | Discharge: 2021-10-08 | Disposition: A | Discharge status: Home / Self Care | Payer: 59 | Attending: Physician Assistant | Admitting: Physician Assistant

## 2021-10-08 ENCOUNTER — Telehealth: Payer: Self-pay | Admitting: *Deleted

## 2021-10-08 DIAGNOSIS — E785 Hyperlipidemia, unspecified: Secondary | ICD-10-CM | POA: Diagnosis not present

## 2021-10-08 DIAGNOSIS — I10 Essential (primary) hypertension: Secondary | ICD-10-CM | POA: Diagnosis not present

## 2021-10-08 DIAGNOSIS — I2510 Atherosclerotic heart disease of native coronary artery without angina pectoris: Secondary | ICD-10-CM

## 2021-10-08 LAB — HEPATIC FUNCTION PANEL
ALT: 40 U/L (ref 0–44)
AST: 44 U/L — ABNORMAL HIGH (ref 15–41)
Albumin: 4.4 g/dL (ref 3.5–5.0)
Alkaline Phosphatase: 53 U/L (ref 38–126)
Bilirubin, Direct: 0.1 mg/dL (ref 0.0–0.2)
Indirect Bilirubin: 0.8 mg/dL (ref 0.3–0.9)
Total Bilirubin: 0.9 mg/dL (ref 0.3–1.2)
Total Protein: 8.1 g/dL (ref 6.5–8.1)

## 2021-10-08 LAB — LIPID PANEL
Cholesterol: 132 mg/dL (ref 0–200)
HDL: 35 mg/dL — ABNORMAL LOW (ref >40)
LDL Cholesterol: 59 mg/dL (ref 0–99)
Total CHOL/HDL Ratio: 3.8 RATIO
Triglycerides: 192 mg/dL — ABNORMAL HIGH (ref <150)
VLDL: 38 mg/dL (ref 0–40)

## 2021-10-08 NOTE — Telephone Encounter (Signed)
No answer/No voicemail box set up.  

## 2021-10-08 NOTE — Telephone Encounter (Signed)
-----   Message from Rise Mu, PA-C sent at 10/08/2021  3:08 PM EDT ----- ?Labs show 1 liver enzyme is mildly elevated that is a stable and consistent with prior readings going back at least 6 years ago.  LDL is well controlled.  Triglycerides are mildly elevated and consistent with most recent reading, though improved from historical readings dating back a couple years ago. ? ?Recommendations: ?-LDL is well controlled ?-Triglycerides remain elevated ?-If he is agreeable, we can start Vascepa 2 g twice daily and stop Lovaza with a follow-up fasting lipid panel and liver function in 2 months ?-If he does not want to start a new medication at this time, would recommend he work on cutting out sugary foods and drinks with a follow-up fasting lipid panel and liver function in 6 months ?

## 2021-10-09 NOTE — Telephone Encounter (Signed)
No answer/No voicemail box set up.  

## 2021-10-09 NOTE — Telephone Encounter (Signed)
No answer/Call could not be completed at this time.  ?

## 2021-10-11 ENCOUNTER — Other Ambulatory Visit: Payer: Self-pay | Admitting: Physician Assistant

## 2021-10-12 ENCOUNTER — Ambulatory Visit: Payer: 59 | Admitting: Family Medicine

## 2021-10-15 MED ORDER — ICOSAPENT ETHYL 1 G PO CAPS: 2.0000 g | ORAL_CAPSULE | Freq: Two times a day (BID) | ORAL | 0 refills | Status: DC

## 2021-10-15 NOTE — Telephone Encounter (Signed)
Reviewed results and recommendations with patient. He was agreeable to try new medication and stop the Lovaza. We also discussed repeat labs needed in 2 months. He verbalized understanding of our conversation with no further questions at this time.  ?

## 2021-10-26 ENCOUNTER — Encounter: Payer: Self-pay | Admitting: Family Medicine

## 2021-10-26 ENCOUNTER — Ambulatory Visit (INDEPENDENT_AMBULATORY_CARE_PROVIDER_SITE_OTHER): Payer: 59 | Admitting: Family Medicine

## 2021-10-26 ENCOUNTER — Other Ambulatory Visit: Payer: Self-pay | Admitting: Family Medicine

## 2021-10-26 VITALS — BP 136/84 | HR 85 | Ht 69.0 in | Wt 180.4 lb

## 2021-10-26 DIAGNOSIS — E1169 Type 2 diabetes mellitus with other specified complication: Secondary | ICD-10-CM

## 2021-10-26 DIAGNOSIS — R351 Nocturia: Secondary | ICD-10-CM

## 2021-10-26 DIAGNOSIS — Z122 Encounter for screening for malignant neoplasm of respiratory organs: Secondary | ICD-10-CM

## 2021-10-26 DIAGNOSIS — I10 Essential (primary) hypertension: Secondary | ICD-10-CM

## 2021-10-26 DIAGNOSIS — J449 Chronic obstructive pulmonary disease, unspecified: Secondary | ICD-10-CM

## 2021-10-26 DIAGNOSIS — Z0000 Encounter for general adult medical examination without abnormal findings: Secondary | ICD-10-CM

## 2021-10-26 DIAGNOSIS — Z720 Tobacco use: Secondary | ICD-10-CM

## 2021-10-26 NOTE — Progress Notes (Signed)
? ?Subjective:  ? ? Patient ID: Clifford Morse, male    DOB: 02-Aug-1960, 61 y.o.   MRN: [id number] ? ?Clifford Morse is a 61 y.o. male presenting on 10/26/2021 for Diabetes and Hypertension ? ? ?HPI ? ?CHRONIC HTN: ?Reports home BP normal range 120s ?Current Meds - Carvedilol '25mg'$  BID, Lisinopril-HCTZ 10-12.'5mg'$    ?Reports good compliance, took meds today. Tolerating well, w/o complaints. ?Denies CP, dyspnea, HA, edema, dizziness / lightheadedness ?  ?DM, Type 2 / Possible neuropathy complication ?Last A1c 6.8, previous 7-9 range ?He is doing well currently. ?CBGs: AM Fasting sugar avg < 150. No symptoms of hypoglycemia ?Meds: Metformin XR '750mg'$  daily in AM ?Currently on ACEi already ?Lifestyle: ?- Diet (still improving low carb options) ?Will go to [facility] when ready ?History of occasional foot stinging symptoms ?Denies hypoglycemia, polyuria, visual changes, numbness or tingling. ?  ?Chronic Pain Syndrome ?Chronic Hip / Knee pain ? ?  ?CAD ?S/p Left Heart Cath by Dr Fletcher Anon 06/19/20 ?He had another cardiac stent placed ?He is on Rosuvastatin '20mg'$ , labs upcoming ?  ?Centrilobular Emphysema / COPD ?Followed [facility] Pulmonology Dr Patsey Berthold ?Previously on Trelegy and then switched to trial on Breztri and did not gain benefit ?  ?  ?Health Maintenance: ?  ?PSA 0.51 negative. ? ? ?  10/26/2021  ?  9:53 AM 04/13/2021  ?  2:12 PM 04/26/2020  ?  6:42 AM  ?Depression screen PHQ 2/9  ?Decreased Interest '2 2 2  '$ ?Down, Depressed, Hopeless '1 1 2  '$ ?PHQ - 2 Score '3 3 4  '$ ?Altered sleeping '1 1 1  '$ ?Tired, decreased energy '3 3 3  '$ ?Change in appetite '3 2 1  '$ ?Feeling bad or failure about yourself  0 0 1  ?Trouble concentrating '3 2 1  '$ ?Moving slowly or fidgety/restless 0 0 0  ?Suicidal thoughts 1 0 0  ?PHQ-9 Score '14 11 11  '$ ?Difficult doing work/chores Somewhat difficult Somewhat difficult Not difficult at all  ? ? ?Social History  ? ?Tobacco Use  ? Smoking status: Every Day  ?  Packs/day: 2.00  ?  Years: 40.00  ?  Pack years: 80.00  ?   Types: Cigarettes  ? Smokeless tobacco: Former  ? Tobacco comments:  ?  1PPD   ?Vaping Use  ? Vaping Use: Never used  ?Substance Use Topics  ? Alcohol use: Yes  ?  Alcohol/week: 0.0 standard drinks  ?  Comment: rare  ? Drug use: No  ? ? ?Review of Systems ?Per HPI unless specifically indicated above ? ?   ?Objective:  ?  ?BP 136/84   Pulse 85   Ht '5\' 9"'$  (1.753 m)   Wt 180 lb 6.4 oz (81.8 kg)   SpO2 100%   BMI 26.64 kg/m?   ?Wt Readings from Last 3 Encounters:  ?10/26/21 180 lb 6.4 oz (81.8 kg)  ?09/19/21 187 lb 2 oz (84.9 kg)  ?04/13/21 183 lb 12.8 oz (83.4 kg)  ?  ?Physical Exam ?Vitals and nursing note reviewed.  ?Constitutional:   ?   General: He is not in acute distress. ?   Appearance: He is well-developed. He is not diaphoretic.  ?   Comments: Well-appearing, comfortable, cooperative  ?HENT:  ?   Head: Normocephalic and atraumatic.  ?Eyes:  ?   General:     ?   Right eye: No discharge.     ?   Left eye: No discharge.  ?   Conjunctiva/sclera: Conjunctivae normal.  ?Neck:  ?  Thyroid: No thyromegaly.  ?Cardiovascular:  ?   Rate and Rhythm: Normal rate and regular rhythm.  ?   Pulses: Normal pulses.  ?   Heart sounds: Normal heart sounds. No murmur heard. ?Pulmonary:  ?   Effort: Pulmonary effort is normal. No respiratory distress.  ?   Breath sounds: Normal breath sounds. No wheezing or rales.  ?Musculoskeletal:     ?   General: Normal range of motion.  ?   Cervical back: Normal range of motion and neck supple.  ?Lymphadenopathy:  ?   Cervical: No cervical adenopathy.  ?Skin: ?   General: Skin is warm and dry.  ?   Findings: No erythema or rash.  ?Neurological:  ?   Mental Status: He is alert and oriented to person, place, and time. Mental status is at baseline.  ?Psychiatric:     ?   Behavior: Behavior normal.  ?   Comments: Well groomed, good eye contact, normal speech and thoughts  ? ? ? ?Results for orders placed or performed during the hospital encounter of 10/08/21  ?Hepatic function panel  ?Result  Value Ref Range  ? Total Protein 8.1 6.5 - 8.1 g/dL  ? Albumin 4.4 3.5 - 5.0 g/dL  ? AST 44 (H) 15 - 41 U/L  ? ALT 40 0 - 44 U/L  ? Alkaline Phosphatase 53 38 - 126 U/L  ? Total Bilirubin 0.9 0.3 - 1.2 mg/dL  ? Bilirubin, Direct 0.1 0.0 - 0.2 mg/dL  ? Indirect Bilirubin 0.8 0.3 - 0.9 mg/dL  ?Lipid panel  ?Result Value Ref Range  ? Cholesterol 132 0 - 200 mg/dL  ? Triglycerides 192 (H) <150 mg/dL  ? HDL 35 (L) >40 mg/dL  ? Total CHOL/HDL Ratio 3.8 RATIO  ? VLDL 38 0 - 40 mg/dL  ? LDL Cholesterol 59 0 - 99 mg/dL  ? ?   ?Assessment & Plan:  ? ?Problem List Items Addressed This Visit   ? ? Type 2 diabetes mellitus with other specified complication ([place]) - Primary  ? Tobacco abuse  ? Relevant Orders  ? Ambulatory Referral Lung Cancer Screening [facility]  ? Stage 1 mild COPD by GOLD classification ([place])  ? Essential hypertension  ? ?Other Visit Diagnoses   ? ? Screening for lung cancer      ? Relevant Orders  ? Ambulatory Referral Lung Cancer Screening [facility]  ? ?  ?  ?T2DM ?Offer A1c lab, will defer until upcoming physical w/ all labs. ?Has been stable in goal ? ?Tobacco abuse ?LDCT ordered ? ?Emphysema ?Re-check pricing on Trelegy and in future we can set you up with our clinical pharmacist to help get discounted price if eligible through the company of the medicine. ? ?Recommend return to GI for Colonoscopy as recommended. When ready please call to schedule. ? ?We will check A1c next time with labs. ? ? ?Orders Placed This Encounter  ?Procedures  ? Ambulatory Referral Lung Cancer Screening [facility]  ?  Referral Priority:   Routine  ?  Referral Type:   Consultation  ?  Referral Reason:   Specialty Services Required  ?  Number of Visits Requested:   1  ? ? ? ?No orders of the defined types were placed in this encounter. ? ? ? ? ?Follow up plan: ?Return in about 6 months (around 04/27/2022) for 6 month fasting labs then 1 week later Annual Physical. ? ?Future labs ordered for  03/2022 ? ? ?Nobie Putnam, DO ?[facility]  [facility] ?[facility] ?10/26/2021, 10:01 AM ?

## 2021-10-26 NOTE — Patient Instructions (Addendum)
Thank you for coming to the office today. ? ?Re-check pricing on Trelegy and in future we can set you up with our clinical pharmacist to help get discounted price if eligible through the company of the medicine. ? ?Recommend return to GI for Colonoscopy as recommended. When ready please call to schedule. ? ?We will check A1c next time with labs. ? ?[facility]   ?Address: [address], [place], [place] [address] ?Phone: [phone]  ?Website: [email or web address] ? ?DUE for FASTING BLOOD WORK (no food or drink after midnight before the lab appointment, only water or coffee without cream/sugar on the morning of) ? ?SCHEDULE "Lab Only" visit in the morning at the clinic for lab draw in 6 MONTHS  ? ?- Make sure Lab Only appointment is at about 1 week before your next appointment, so that results will be available ? ?For Lab Results, once available within 2-3 days of blood draw, you can can log in to MyChart online to view your results and a brief explanation. Also, we can discuss results at next follow-up visit. ? ? ?Please schedule a Follow-up Appointment to: Return in about 6 months (around 04/27/2022) for 6 month fasting labs then 1 week later Annual Physical. ? ?If you have any other questions or concerns, please feel free to call the office or send a message through [place]. You may also schedule an earlier appointment if necessary. ? ?Additionally, you may be receiving a survey about your experience at our office within a few days to 1 week by e-mail or mail. We value your feedback. ? ?Nobie Putnam, DO ?[facility] ?

## 2021-11-05 ENCOUNTER — Ambulatory Visit (INDEPENDENT_AMBULATORY_CARE_PROVIDER_SITE_OTHER): Payer: 59 | Admitting: Cardiology

## 2021-11-05 ENCOUNTER — Encounter: Payer: Self-pay | Admitting: Cardiology

## 2021-11-05 VITALS — BP 140/72 | HR 100 | Ht 69.0 in | Wt 185.0 lb

## 2021-11-05 DIAGNOSIS — F17200 Nicotine dependence, unspecified, uncomplicated: Secondary | ICD-10-CM

## 2021-11-05 DIAGNOSIS — I2510 Atherosclerotic heart disease of native coronary artery without angina pectoris: Secondary | ICD-10-CM | POA: Diagnosis not present

## 2021-11-05 DIAGNOSIS — I10 Essential (primary) hypertension: Secondary | ICD-10-CM | POA: Diagnosis not present

## 2021-11-05 DIAGNOSIS — R072 Precordial pain: Secondary | ICD-10-CM

## 2021-11-05 DIAGNOSIS — E782 Mixed hyperlipidemia: Secondary | ICD-10-CM | POA: Diagnosis not present

## 2021-11-05 DIAGNOSIS — R69 Illness, unspecified: Secondary | ICD-10-CM | POA: Diagnosis not present

## 2021-11-05 MED ORDER — ISOSORBIDE MONONITRATE ER 30 MG PO TB24: 15.0000 mg | ORAL_TABLET | Freq: Every day | ORAL | 1 refills | Status: DC

## 2021-11-05 MED ORDER — PRALUENT 150 MG/ML ~~LOC~~ SOAJ: 1.0000 "pen " | SUBCUTANEOUS | 11 refills | Status: DC

## 2021-11-05 MED ORDER — EZETIMIBE 10 MG PO TABS: 10.0000 mg | ORAL_TABLET | Freq: Every day | ORAL | 3 refills | Status: DC

## 2021-11-05 NOTE — Patient Instructions (Addendum)
Medication Instructions:  ? ?Your physician has recommended you make the following change in your medication:  ? ?  STOP taking Rosuvastatin (Crestor). ? ?2.    START taking Isosorbide 15 MG (This will be half a tablet) once a day.  ? ?3.    START taking Zetia 10 MG once a day. ? ?4.    START taking Praluent 150 MG subcutaneously once every 14 days. ? ?*If you need a refill on your cardiac medications before your next appointment, please call your pharmacy* ? ? ?Lab Work: ? ?None ordered ? ?If you have labs (blood work) drawn today and your tests are completely normal, you will receive your results only by: ?MyChart Message (if you have MyChart) OR ?A paper copy in the mail ?If you have any lab test that is abnormal or we need to change your treatment, we will call you to review the results. ? ? ?Testing/Procedures: ? ?[facility] MYOVIEW    ? ?Your caregiver has ordered a Stress Test with nuclear imaging. The purpose of this test is to evaluate the blood supply to your heart muscle. This procedure is referred to as a "Non-Invasive Stress Test." This is because other than having an IV started in your vein, nothing is inserted or "invades" your body. Cardiac stress tests are done to find areas of poor blood flow to the heart by determining the extent of coronary artery disease (CAD). Some patients exercise on a treadmill, which naturally increases the blood flow to your heart, while others who are  unable to walk on a treadmill due to physical limitations have a pharmacologic/chemical stress agent called Lexiscan . This medicine will mimic walking on a treadmill by temporarily increasing your coronary blood flow.   ?   ?PLEASE REPORT TO [facility] ENTRANCE   ?THE VOLUNTEERS AT THE FIRST DESK WILL DIRECT YOU WHERE TO GO  ? ? ? ?*Please note: these test may take anywhere between 2-4 hours to complete  ? ?   ? ?Date of Procedure:_____________________________________  ? ?Arrival Time for  Procedure:______________________________  ? ? ?PLEASE NOTIFY THE OFFICE AT LEAST 24 HOURS IN ADVANCE IF YOU ARE UNABLE TO KEEP YOUR APPOINTMENT.  [phone]  ?PLEASE NOTIFY NUCLEAR MEDICINE AT [facility] AT LEAST 55 HOURS IN ADVANCE IF YOU ARE UNABLE TO KEEP YOUR APPOINTMENT. [phone]  ? ?   ? ? ? ?How to prepare for your Myoview test:  ? ?     ? ?_XX___:  Hold diabetes medication the morning of procedure: metFORMIN (GLUCOPHAGE-XR)  ? ? ?1. Do not eat or drink after midnight  ?2. No caffeine for 24 hours prior to test  ?3. No smoking 24 hours prior to test.  ?4. Unless instructed otherwise, Take your medication with a small sips of water.    ?5.         Ladies, please do not wear dresses. Skirts or pants are appropriate. Please wear a short sleeve shirt.  ?6. No perfume, cologne or lotion.  ?7. Wear comfortable walking shoes. No heels!  ? ? ? ? ?Follow-Up: ?At [facility], you and your health needs are our priority.  As part of our continuing mission to provide you with exceptional heart care, we have created designated Provider Care Teams.  These Care Teams include your primary Cardiologist (physician) and Advanced Practice Providers (APPs -  Physician Assistants and Nurse Practitioners) who all work together to provide you with the care you need, when you need it. ? ?We recommend  signing up for the patient portal called "MyChart".  Sign up information is provided on this After Visit Summary.  MyChart is used to connect with patients for Virtual Visits (Telemedicine).  Patients are able to view lab/test results, encounter notes, upcoming appointments, etc.  Non-urgent messages can be sent to your provider as well.   ?To learn more about what you can do with MyChart, go to [email or web address].   ? ?Your next appointment:   ?4-6 week(s) ? ?The format for your next appointment:   ?In Person ? ?Provider:   ? ?ONLY WITH ?Kate Sable, MD  ? ? ?Other Instructions ? ? ?Important Information About  [place] ? ? ? ? ? ? ?

## 2021-11-05 NOTE — Progress Notes (Signed)
?Cardiology Office Note:   ? ?Date:  11/05/2021  ? ?ID:  Clifford Morse, DOB 11-20-1960, MRN [id number] ? ?PCP:  Olin Hauser, DO  ?[facility] Cardiologist:  Kate Sable, MD  ?[facility] Electrophysiologist:  None  ? ?Referring MD: Nobie Putnam *  ? ?Chief Complaint  ?Patient presents with  ? Other  ?  Patient c/o "chest pain and SOB since having my last stent placed." Meds reviewed verbally with patient.   ? ? ? ?History of Present Illness:   ? ?Clifford Morse is a 61 y.o. male with a hx of MI/CAD (s/p DES to mid LAD 03/2013, recent mid to distal LAD stent 06/19/2020), hypertension, hyperlipidemia, current smoker x35+ years, COPD who presents for follow-up.  ? ?Patient complains of chest pain when he moves his normal.  He has a few small, try to mow his lawn 2 weeks ago and developed chest tightness similar to when he had his first stent placed.  Has not tried more ?Again.  States having occasional nonspecific chest discomfort over the past 2 weeks.  He still smokes, is working on quitting, hopefully this will be his last week.  He complains of muscle aches all his life, seems to have gotten worse over the past year or so.  Compliant with all medications as prescribed. ? ? ? ?Prior notes ?Had an MI in 2014, drug-eluting stent was placed to the LAD. ?Left heart cath 06/19/2020 patent mid LAD stent, mid to distal LAD 80% disease status post DES. Mild left circumflex and RCA disease,  ?Echo 05/24/2020 showed normal systolic and diastolic function, EF 60 to 65% ? ?Past Medical History:  ?Diagnosis Date  ? Allergy   ? Arthritis   ? Asthma   ? Chest pain on exertion   ? Diabetes mellitus, type 2 ([place])   ? GERD (gastroesophageal reflux disease)   ? Hyperlipidemia   ? Vertigo   ? with sinus issues  ? ? ?Past Surgical History:  ?Procedure Laterality Date  ? CARDIAC CATHETERIZATION  2014  ? stent  ? CORONARY STENT INTERVENTION  2014  ? CORONARY STENT INTERVENTION N/A 06/19/2020  ? Procedure:  CORONARY STENT INTERVENTION;  Surgeon: Wellington Hampshire, MD;  Location: [facility];  Service: Cardiovascular;  Laterality: N/A;  ? ESOPHAGOGASTRODUODENOSCOPY    ? HERNIA REPAIR    ? LEFT HEART CATH AND CORONARY ANGIOGRAPHY Left 06/19/2020  ? Procedure: LEFT HEART CATH AND CORONARY ANGIOGRAPHY;  Surgeon: Wellington Hampshire, MD;  Location: [facility];  Service: Cardiovascular;  Laterality: Left;  ? ? ?Current Medications: ?Current Meds  ?Medication Sig  ? albuterol (VENTOLIN HFA) 108 (90 Base) MCG/ACT inhaler Inhale 2 puffs into the lungs every 6 (six) hours as needed for wheezing or shortness of breath.  ? Alirocumab (PRALUENT) 150 MG/ML SOAJ Inject 1 pen. into the skin every 14 (fourteen) days.  ? aspirin 81 MG tablet Take 81 mg by mouth daily.  ? carvedilol (COREG) 25 MG tablet Take 1 tablet (25 mg total) by mouth 2 (two) times daily with a meal.  ? clopidogrel (PLAVIX) 75 MG tablet Take 1 tablet (75 mg total) by mouth daily.  ? cyclobenzaprine (FLEXERIL) 10 MG tablet Take 0.5-1 tablets (5-10 mg total) by mouth 3 (three) times daily as needed for muscle spasms.  ? DULoxetine (CYMBALTA) 60 MG capsule Take 1 capsule (60 mg total) by mouth daily.  ? ezetimibe (ZETIA) 10 MG tablet Take 1 tablet (10 mg total) by mouth  daily.  ? fenofibrate 160 MG tablet Take 1 tablet (160 mg total) by mouth daily.  ? fluticasone (FLONASE) 50 MCG/ACT nasal spray Place 1 spray into both nostrils daily as needed for allergies or rhinitis.  ? gabapentin (NEURONTIN) 600 MG tablet Take 1 tablet (600 mg total) by mouth at bedtime.  ? icosapent Ethyl (VASCEPA) 1 g capsule Take 2 capsules (2 g total) by mouth 2 (two) times daily for 90 doses.  ? isosorbide mononitrate (IMDUR) 30 MG 24 hr tablet Take 0.5 tablets (15 mg total) by mouth daily.  ? lisinopril-hydrochlorothiazide (ZESTORETIC) 10-12.5 MG tablet Take 1 tablet by mouth daily.  ? metFORMIN (GLUCOPHAGE-XR) 750 MG 24 hr tablet Take 1 tablet (750 mg total) by mouth daily  with breakfast.  ? pantoprazole (PROTONIX) 40 MG tablet Take 1 tablet (40 mg total) by mouth daily before breakfast.  ? pseudoephedrine-acetaminophen (TYLENOL SINUS) 30-500 MG TABS tablet Take 2 tablets by mouth 2 (two) times daily as needed (congestion).  ? traZODone (DESYREL) 100 MG tablet Take 1 tablet (100 mg total) by mouth at bedtime.  ? [DISCONTINUED] rosuvastatin (CRESTOR) 20 MG tablet Take 1 tablet by mouth once daily  ?  ? ?Allergies:   Crestor [rosuvastatin]  ? ?Social History  ? ?Socioeconomic History  ? Marital status: Married  ?  Spouse name: Marcie Bal   ? Number of children: 2  ? Years of education: Not on file  ? Highest education level: Not on file  ?Occupational History  ? Not on file  ?Tobacco Use  ? Smoking status: Every Day  ?  Packs/day: 2.00  ?  Years: 40.00  ?  Pack years: 80.00  ?  Types: Cigarettes  ? Smokeless tobacco: Former  ? Tobacco comments:  ?  1PPD   ?Vaping Use  ? Vaping Use: Never used  ?Substance and Sexual Activity  ? Alcohol use: Yes  ?  Alcohol/week: 0.0 standard drinks  ?  Comment: rare  ? Drug use: No  ? Sexual activity: Not on file  ?Other Topics Concern  ? Not on file  ?Social History Narrative  ? Lives at home with wife  ? ?Social Determinants of Health  ? ?Financial Resource Strain: Not on file  ?Food Insecurity: Not on file  ?Transportation Needs: Not on file  ?Physical Activity: Not on file  ?Stress: Not on file  ?Social Connections: Not on file  ?  ? ?Family History: ?The patient's family history includes Diabetes in his mother; Emphysema in his father. ? ?ROS:   ?Please see the history of present illness.    ? All other systems reviewed and are negative. ? ?EKGs/Labs/Other Studies Reviewed:   ? ?The following studies were reviewed today: ? ? ?EKG:  EKG is  ordered today.  The ekg ordered today demonstrates normal sinus rhythm, normal ECG. ? ?Recent Labs: ?04/06/2021: BUN 14; Creat 0.76; Hemoglobin 14.2; Platelets 260; Potassium 4.5; Sodium 136; TSH 0.96 ?10/08/2021: ALT 40   ?Recent Lipid Panel ?   ?Component Value Date/Time  ? CHOL 132 10/08/2021 1028  ? CHOL 163 06/23/2015 0933  ? CHOL 209 (H) 02/28/2013 0324  ? TRIG 192 (H) 10/08/2021 1028  ? TRIG 247 (H) 02/28/2013 0324  ? HDL 35 (L) 10/08/2021 1028  ? HDL 41 06/23/2015 0933  ? HDL 38 (L) 02/28/2013 0324  ? CHOLHDL 3.8 10/08/2021 1028  ? VLDL 38 10/08/2021 1028  ? VLDL 49 (H) 02/28/2013 0324  ? LDLCALC 59 10/08/2021 1028  ? LDLCALC 123 (H) 04/06/2021  0910  ? LDLCALC 122 (H) 02/28/2013 0324  ? ? ? ?Risk Assessment/Calculations:   ? ? ? ?Physical Exam:   ? ?VS:  BP 140/72 (BP Location: Left Arm, Patient Position: Sitting, Cuff Size: Normal)   Pulse 100   Ht '5\' 9"'$  (1.753 m)   Wt 185 lb (83.9 kg)   SpO2 95%   BMI 27.32 kg/m?    ? ?Wt Readings from Last 3 Encounters:  ?11/05/21 185 lb (83.9 kg)  ?10/26/21 180 lb 6.4 oz (81.8 kg)  ?09/19/21 187 lb 2 oz (84.9 kg)  ?  ? ?GEN:  Well nourished, well developed in no acute distress ?HEENT: Normal ?NECK: No JVD; No carotid bruits ?LYMPHATICS: No lymphadenopathy ?CARDIAC: RRR, no murmurs, rubs, gallops ?RESPIRATORY:  Clear to auscultation without rales, wheezing or rhonchi  ?ABDOMEN: Soft, non-tender, non-distended ?MUSCULOSKELETAL:  No edema; No deformity  ?SKIN: Warm and dry ?NEUROLOGIC:  Alert and oriented x 3 ?PSYCHIATRIC:  Normal affect  ? ?ASSESSMENT:   ? ?1. Coronary artery disease involving native coronary artery of native heart, unspecified whether angina present   ?2. Mixed hyperlipidemia   ?3. Primary hypertension   ?4. Smoking   ?5. Precordial pain   ? ?PLAN:   ? ?In order of problems listed above: ? ?Patient with history of CAD/MI s/p DES to LAD x 2.  Chest pain consistent with angina.  Start Imdur 15 mg daily, obtain [employer] continue aspirin, Plavix, Coreg.  Last echo with preserved ejection fraction.  ?Hyperlipidemia, elevated triglycerides, goal LDL less than 70.  Patient has myalgias.  Stop Crestor, start Zetia.  Continue Vascepa, fenofibrate.  Start PCSK9 if  insurance approves. ?hypertension, BP controlled.  Continue Coreg, lisinopril, HCTZ as prescribed. ?Current smoker, smoking cessation advised. ? ? ?Follow-up in 6 weeks ? ?Shared Decision Making/Informed Consen

## 2021-11-13 ENCOUNTER — Encounter
Admission: RE | Admit: 2021-11-13 | Discharge: 2021-11-13 | Disposition: A | Discharge status: Home / Self Care | Payer: 59 | Source: Ambulatory Visit | Attending: Cardiology | Admitting: Cardiology

## 2021-11-13 DIAGNOSIS — R072 Precordial pain: Secondary | ICD-10-CM | POA: Diagnosis not present

## 2021-11-13 MED ORDER — TECHNETIUM TC 99M TETROFOSMIN IV KIT
30.0000 | PACK | Freq: Once | INTRAVENOUS | Status: AC
  Administered 2021-11-13: 29.92 via INTRAVENOUS

## 2021-11-13 MED ORDER — REGADENOSON 0.4 MG/5ML IV SOLN
0.4000 mg | Freq: Once | INTRAVENOUS | Status: AC
  Administered 2021-11-13: 0.4 mg via INTRAVENOUS

## 2021-11-13 MED ORDER — TECHNETIUM TC 99M TETROFOSMIN IV KIT
9.8800 | PACK | Freq: Once | INTRAVENOUS | Status: AC | PRN
  Administered 2021-11-13: 9.88 via INTRAVENOUS

## 2021-11-14 LAB — NM MYOCAR MULTI W/SPECT W/WALL MOTION / EF
LV dias vol: 98 mL (ref 62–150)
LV sys vol: 31 mL
Nuc Stress EF: 68 %
Peak HR: 121 {beats}/min
Percent HR: 76 %
Rest HR: 82 {beats}/min
Rest Nuclear Isotope Dose: 9.9 mCi
SDS: 1
SRS: 1
SSS: 1
ST Depression (mm): 0 mm
Stress Nuclear Isotope Dose: 29.9 mCi
TID: 1.12

## 2021-11-30 ENCOUNTER — Encounter: Payer: Self-pay | Admitting: Family Medicine

## 2021-11-30 ENCOUNTER — Ambulatory Visit (INDEPENDENT_AMBULATORY_CARE_PROVIDER_SITE_OTHER): Payer: 59 | Admitting: Family Medicine

## 2021-11-30 VITALS — BP 130/78 | HR 86 | Ht 69.0 in | Wt 178.6 lb

## 2021-11-30 DIAGNOSIS — M25561 Pain in right knee: Secondary | ICD-10-CM

## 2021-11-30 DIAGNOSIS — G8929 Other chronic pain: Secondary | ICD-10-CM | POA: Diagnosis not present

## 2021-11-30 DIAGNOSIS — M25552 Pain in left hip: Secondary | ICD-10-CM | POA: Diagnosis not present

## 2021-11-30 DIAGNOSIS — G894 Chronic pain syndrome: Secondary | ICD-10-CM | POA: Diagnosis not present

## 2021-11-30 DIAGNOSIS — M25562 Pain in left knee: Secondary | ICD-10-CM

## 2021-11-30 DIAGNOSIS — M7918 Myalgia, other site: Secondary | ICD-10-CM

## 2021-11-30 DIAGNOSIS — M545 Low back pain, unspecified: Secondary | ICD-10-CM

## 2021-11-30 DIAGNOSIS — M25551 Pain in right hip: Secondary | ICD-10-CM | POA: Diagnosis not present

## 2021-11-30 NOTE — Progress Notes (Signed)
Subjective:    Patient ID: Clifford Morse, male    DOB: 07-22-1960, 61 y.o.   MRN: [id number]  Clifford Morse is a 61 y.o. male presenting on 11/30/2021 for Back Pain   HPI  Chronic pain syndrome Chronic Myofascial pain R Hip Pain Low Back Pain  Never seen pain specialist for this. He has had pain for years Admits all joints and all muscles hurting  Tried Gabapentin 697m nightly, Flexeril 132mTID PRN, Duloxetine 6053mChronic problem with R hip pain  Chronic Pain Syndrome Chronic Hip / Knee pain Reports he has had chronic pain for long time. He has had worsening difficulty adapting to generalized pain, especially in both hips and knees.  He takes occasional OTC Ibuprofen, not consecutive days  Asking about further testing and management. Fam history of rheumatological condition or advanced arthritis Father.      11/30/2021   11:41 AM 10/26/2021    9:53 AM 04/13/2021    2:12 PM  Depression screen PHQ 2/9  Decreased Interest '3 2 2  ' Down, Depressed, Hopeless '1 1 1  ' PHQ - 2 Score '4 3 3  ' Altered sleeping '1 1 1  ' Tired, decreased energy '3 3 3  ' Change in appetite '2 3 2  ' Feeling bad or failure about yourself  0 0 0  Trouble concentrating '3 3 2  ' Moving slowly or fidgety/restless 1 0 0  Suicidal thoughts 0 1 0  PHQ-9 Score '14 14 11  ' Difficult doing work/chores Extremely dIfficult Somewhat difficult Somewhat difficult    Social History   Tobacco Use   Smoking status: Every Day    Packs/day: 2.00    Years: 40.00    Pack years: 80.00    Types: Cigarettes   Smokeless tobacco: Former   Tobacco comments:    1PPD   Vaping Use   Vaping Use: Never used  Substance Use Topics   Alcohol use: Yes    Alcohol/week: 0.0 standard drinks    Comment: rare   Drug use: No    Review of Systems Per HPI unless specifically indicated above     Objective:    BP 130/78   Pulse 86   Ht '5\' 9"'  (1.753 m)   Wt 178 lb 9.6 oz (81 kg)   SpO2 99%   BMI 26.37 kg/m   Wt Readings from  Last 3 Encounters:  11/30/21 178 lb 9.6 oz (81 kg)  11/05/21 185 lb (83.9 kg)  10/26/21 180 lb 6.4 oz (81.8 kg)    Physical Exam Vitals and nursing note reviewed.  Constitutional:      General: He is not in acute distress.    Appearance: Normal appearance. He is well-developed. He is not diaphoretic.     Comments: Well-appearing, comfortable, cooperative  HENT:     Head: Normocephalic and atraumatic.  Eyes:     General:        Right eye: No discharge.        Left eye: No discharge.     Conjunctiva/sclera: Conjunctivae normal.  Cardiovascular:     Rate and Rhythm: Normal rate.  Pulmonary:     Effort: Pulmonary effort is normal.  Skin:    General: Skin is warm and dry.     Findings: No erythema or rash.  Neurological:     Mental Status: He is alert and oriented to person, place, and time.  Psychiatric:        Mood and Affect: Mood normal.  Behavior: Behavior normal.        Thought Content: Thought content normal.     Comments: Well groomed, good eye contact, normal speech and thoughts    I have personally reviewed the radiology report from 06/06/20.  CLINICAL DATA:  Right hip pain   EXAM: DG HIP (WITH OR WITHOUT PELVIS) 2-3V RIGHT   COMPARISON:  None.   FINDINGS: There is no evidence of hip fracture or dislocation. Hip joint space is maintained. There is some osteophytosis along the superolateral aspect of the acetabula bilaterally. Pelvic bony ring intact.   IMPRESSION: Mild degenerative changes of the bilateral hips.     Electronically Signed   By: Davina Poke D.O.   On: 06/07/2020 16:31  ------  I have personally reviewed the radiology report from 06/06/20.  CLINICAL DATA:  chronic bilateral joint and muscle pain for years, right hip and knee worse, no injury, rule out arthritis   EXAM: RIGHT KNEE - COMPLETE 4+ VIEW   COMPARISON:  02/05/2013.   FINDINGS: No evidence of fracture, dislocation, or joint effusion. No evidence of arthropathy  or other focal bone abnormality. No soft tissue swelling.   IMPRESSION: Negative.     Electronically Signed   By: Primitivo Gauze M.D.   On: 06/07/2020 16:29  Results for orders placed or performed during the hospital encounter of 11/13/21  NM Myocar Multi W/Spect W/Wall Motion / EF  Result Value Ref Range   Rest HR 82.0 bpm   Rest BP 163/86 mmHg   Peak HR 121 bpm   Peak BP 164/82 mmHg   Percent HR 76.0 %   ST Depression (mm) 0 mm   Rest Nuclear Isotope Dose 9.9 mCi   Stress Nuclear Isotope Dose 29.9 mCi   SSS 1.0    SRS 1.0    SDS 1.0    TID 1.12    LV sys vol 31.0 mL   LV dias vol 98.0 62 - 150 mL   Nuc Stress EF 68 %      Assessment & Plan:   Problem List Items Addressed This Visit     Chronic pain syndrome   Relevant Orders   Rheumatoid Factor   Sed Rate (ESR)   C-reactive protein   ANA   COMPLETE METABOLIC PANEL WITH GFR   CBC with Differential/Platelet   Cyclic citrul peptide antibody, IgG   Chronic myofascial pain - Primary   Relevant Orders   Rheumatoid Factor   Sed Rate (ESR)   C-reactive protein   ANA   COMPLETE METABOLIC PANEL WITH GFR   CBC with Differential/Platelet   Cyclic citrul peptide antibody, IgG   Other Visit Diagnoses     Bilateral chronic knee pain       Chronic hip pain, bilateral       Chronic bilateral low back pain without sciatica       Relevant Orders   DG Lumbar Spine Complete      Chronic pain Syndrome Chronic Myofascial Pain Multiple pain generators MSK including joint pain Last x-rays reviewed 2021 with minimal DJD or normal  Not due to statin as he has remained off and has not improved.  Continue current therapy Duloxetine, Gabapentin, Flexeril   Ordered X-ray Lumbar low back pain, come in walk in first come first serve next week Mon-Thurs 8am to 4pm. Will send results within 1-2 days.  Labs today extensive panel to investigate autoimmune, rheumatological labs. Once we get results will review and refer to  specialist.  If we find an issue on the labs we can refer to RHEUMATOLOGY  If negative labs we can refer to PHYSIATRY vs PAIN Management  Orders Placed This Encounter  Procedures   DG Lumbar Spine Complete    Standing Status:   Future    Standing Expiration Date:   12/01/2022    Order Specific Question:   Reason for Exam (SYMPTOM  OR DIAGNOSIS REQUIRED)    Answer:   chronic low back pain, history of lumbar degenerative disc    Order Specific Question:   Preferred imaging location?    Answer:   [facility]   Rheumatoid Factor   Sed Rate (ESR)   C-reactive protein   ANA   COMPLETE METABOLIC PANEL WITH GFR   CBC with Differential/Platelet   Cyclic citrul peptide antibody, IgG     No orders of the defined types were placed in this encounter.     Follow up plan: Return if symptoms worsen or fail to improve.   Nobie Putnam, [facility] 11/30/2021, 11:21 AM

## 2021-11-30 NOTE — Patient Instructions (Addendum)
Thank you for coming to the office today.  Ordered X-ray Lumbar low back pain, come in walk in first come first serve next week Mon-Thurs 8am to 4pm. Will send results within 1-2 days.  Labs today extensive panel to investigate autoimmune, rheumatological labs. Once we get results will review and refer to specialist.  If we find an issue on the labs we can refer to [facility] [place],  [place]  [id number] Get Driving Directions Main: [phone] Fax: [place], [place] [address] Hours: M-Th 8-5pm / F 8-12noon Phone: [phone] Fax: [phone]  Tauseef G. Dossie Der, MD Rheumatology [facility] [place], [place], [place], [place]  [phone]  -----------------------------------------------------  If rheumatology labs are NORMAL or negative.  [facility] [place], [place] [address] [phone]  [facility] [place], [place]  [phone]   Please schedule a Follow-up Appointment to: Return if symptoms worsen or fail to improve.  If you have any other questions or concerns, please feel free to call the office or send a message through [place]. You may also schedule an earlier appointment if necessary.  Additionally, you may be receiving a survey about your experience at our office within a few days to 1 week by e-mail or mail. We value your feedback.  Nobie Putnam, DO [place]

## 2021-12-03 ENCOUNTER — Ambulatory Visit
Admission: RE | Admit: 2021-12-03 | Discharge: 2021-12-03 | Disposition: A | Discharge status: Home / Self Care | Payer: 59 | Source: Ambulatory Visit | Attending: Family Medicine | Admitting: Family Medicine

## 2021-12-03 ENCOUNTER — Ambulatory Visit
Admission: RE | Admit: 2021-12-03 | Discharge: 2021-12-03 | Disposition: A | Discharge status: Home / Self Care | Payer: 59 | Attending: Family Medicine | Admitting: Family Medicine

## 2021-12-03 DIAGNOSIS — M545 Low back pain, unspecified: Secondary | ICD-10-CM | POA: Diagnosis not present

## 2021-12-03 DIAGNOSIS — M48061 Spinal stenosis, lumbar region without neurogenic claudication: Secondary | ICD-10-CM | POA: Diagnosis not present

## 2021-12-03 DIAGNOSIS — G8929 Other chronic pain: Secondary | ICD-10-CM | POA: Diagnosis not present

## 2021-12-03 LAB — CBC WITH DIFFERENTIAL/PLATELET
Absolute Monocytes: 531 cells/uL (ref 200–950)
Basophils Absolute: 108 cells/uL (ref 0–200)
Basophils Relative: 1.2 %
Eosinophils Absolute: 270 cells/uL (ref 15–500)
Eosinophils Relative: 3 %
HCT: 44.5 % (ref 38.5–50.0)
Hemoglobin: 15.4 g/dL (ref 13.2–17.1)
Lymphs Abs: 3150 cells/uL (ref 850–3900)
MCH: 31.2 pg (ref 27.0–33.0)
MCHC: 34.6 g/dL (ref 32.0–36.0)
MCV: 90.1 fL (ref 80.0–100.0)
MPV: 8.8 fL (ref 7.5–12.5)
Monocytes Relative: 5.9 %
Neutro Abs: 4941 cells/uL (ref 1500–7800)
Neutrophils Relative %: 54.9 %
Platelets: 301 10*3/uL (ref 140–400)
RBC: 4.94 10*6/uL (ref 4.20–5.80)
RDW: 11.9 % (ref 11.0–15.0)
Total Lymphocyte: 35 %
WBC: 9 10*3/uL (ref 3.8–10.8)

## 2021-12-03 LAB — COMPLETE METABOLIC PANEL WITH GFR
AG Ratio: 1.5 (calc) (ref 1.0–2.5)
ALT: 44 U/L (ref 9–46)
AST: 42 U/L — ABNORMAL HIGH (ref 10–35)
Albumin: 4.7 g/dL (ref 3.6–5.1)
Alkaline phosphatase (APISO): 55 U/L (ref 35–144)
BUN: 12 mg/dL (ref 7–25)
CO2: 27 mmol/L (ref 20–32)
Calcium: 9.8 mg/dL (ref 8.6–10.3)
Chloride: 97 mmol/L — ABNORMAL LOW (ref 98–110)
Creat: 0.88 mg/dL (ref 0.70–1.35)
Globulin: 3.1 g/dL (calc) (ref 1.9–3.7)
Glucose, Bld: 155 mg/dL — ABNORMAL HIGH (ref 65–139)
Potassium: 4.5 mmol/L (ref 3.5–5.3)
Sodium: 134 mmol/L — ABNORMAL LOW (ref 135–146)
Total Bilirubin: 0.5 mg/dL (ref 0.2–1.2)
Total Protein: 7.8 g/dL (ref 6.1–8.1)
eGFR: 98 mL/min/{1.73_m2} (ref >=60)

## 2021-12-03 LAB — RHEUMATOID FACTOR: Rheumatoid fact SerPl-aCnc: 36 IU/mL — ABNORMAL HIGH (ref <14)

## 2021-12-03 LAB — SEDIMENTATION RATE: Sed Rate: 2 mm/h (ref 0–20)

## 2021-12-03 LAB — ANA: Anti Nuclear Antibody (ANA): NEGATIVE

## 2021-12-03 LAB — CYCLIC CITRUL PEPTIDE ANTIBODY, IGG: Cyclic Citrullin Peptide Ab: <16 UNITS

## 2021-12-03 LAB — C-REACTIVE PROTEIN: CRP: 3.3 mg/L (ref <8.0)

## 2021-12-05 ENCOUNTER — Other Ambulatory Visit: Payer: Self-pay | Admitting: Family Medicine

## 2021-12-05 DIAGNOSIS — G8929 Other chronic pain: Secondary | ICD-10-CM

## 2021-12-05 DIAGNOSIS — R768 Other specified abnormal immunological findings in serum: Secondary | ICD-10-CM

## 2021-12-09 ENCOUNTER — Other Ambulatory Visit: Payer: Self-pay | Admitting: Physician Assistant

## 2021-12-13 ENCOUNTER — Other Ambulatory Visit: Payer: Self-pay | Admitting: Physician Assistant

## 2021-12-13 ENCOUNTER — Encounter: Payer: Self-pay | Admitting: Cardiology

## 2021-12-13 ENCOUNTER — Ambulatory Visit (INDEPENDENT_AMBULATORY_CARE_PROVIDER_SITE_OTHER): Payer: 59 | Admitting: Cardiology

## 2021-12-13 VITALS — BP 122/70 | HR 91 | Ht 69.0 in | Wt 185.0 lb

## 2021-12-13 DIAGNOSIS — E782 Mixed hyperlipidemia: Secondary | ICD-10-CM | POA: Diagnosis not present

## 2021-12-13 DIAGNOSIS — I10 Essential (primary) hypertension: Secondary | ICD-10-CM

## 2021-12-13 DIAGNOSIS — F17200 Nicotine dependence, unspecified, uncomplicated: Secondary | ICD-10-CM | POA: Diagnosis not present

## 2021-12-13 DIAGNOSIS — I2510 Atherosclerotic heart disease of native coronary artery without angina pectoris: Secondary | ICD-10-CM

## 2021-12-13 DIAGNOSIS — R69 Illness, unspecified: Secondary | ICD-10-CM | POA: Diagnosis not present

## 2021-12-13 MED ORDER — PRALUENT 150 MG/ML ~~LOC~~ SOAJ: 1.0000 "pen " | SUBCUTANEOUS | 11 refills | Status: DC

## 2021-12-13 MED ORDER — CLOPIDOGREL BISULFATE 75 MG PO TABS: 75.0000 mg | ORAL_TABLET | Freq: Every day | ORAL | 0 refills | Status: DC

## 2021-12-13 NOTE — Patient Instructions (Signed)
Medication Instructions:    START taking Praluent auto inject once every 14 days.  *If you need a refill on your cardiac medications before your next appointment, please call your pharmacy*   Lab Work:  Your physician recommends that you return for a FASTING lipid profile: in 4 months  - You will need to be fasting. Please do not have anything to eat or drink after midnight the morning you have the lab work. You may only have water or black coffee with no cream or sugar.   - Please go to the [facility]. You will check in at the front desk to the right as you walk into the atrium. Valet Parking is offered if needed. - No appointment needed. You may go any day between 7 am and 6 pm.     Follow-Up: At [facility], you and your health needs are our priority.  As part of our continuing mission to provide you with exceptional heart care, we have created designated Provider Care Teams.  These Care Teams include your primary Cardiologist (physician) and Advanced Practice Providers (APPs -  Physician Assistants and Nurse Practitioners) who all work together to provide you with the care you need, when you need it.  We recommend signing up for the patient portal called "MyChart".  Sign up information is provided on this After Visit Summary.  MyChart is used to connect with patients for Virtual Visits (Telemedicine).  Patients are able to view lab/test results, encounter notes, upcoming appointments, etc.  Non-urgent messages can be sent to your provider as well.   To learn more about what you can do with MyChart, go to [email or web address].    Your next appointment:   6 month(s)  The format for your next appointment:   In Person  Provider:   You may see Kate Sable, MD or one of the following Advanced Practice Providers on your designated Care Team:   Murray Hodgkins, NP Christell Faith, PA-C Cadence Kathlen Mody, [place]    Other Instructions   Important Information About  [place]

## 2021-12-13 NOTE — Progress Notes (Signed)
Cardiology Office Note:    Date:  12/13/2021   ID:  Clifford Morse, DOB 29-Feb-1961, MRN [id number]  PCP:  Olin Hauser, DO  [place] Cardiologist:  Kate Sable, MD  [place] Electrophysiologist:  None   Referring MD: Nobie Putnam *   Chief Complaint  Patient presents with   Follow-up    4-6 week follow up. Patient states that he is fine other than being real tired. Meds reviewed with patient.      History of Present Illness:    Clifford Morse is a 61 y.o. male with a hx of MI/CAD (s/p DES to mid LAD 03/2013, recent mid to distal LAD stent 06/19/2020), hypertension, hyperlipidemia, current smoker x35+ years, COPD who presents for follow-up.   Previously seen due to symptoms of chest pain.  Lexiscan Myoview was ordered to evaluate any ischemia, Myoview was low risk.  Imdur was started for antianginal benefit.  He states feeling well, denies chest pain.  Takes medications as prescribed.  Praluent was started due to statin allergies.  Has not started taking yet because he was not sure if to take medication.  He otherwise states feeling well from a cardiac perspective.  He still smokes.   Prior notes Had an MI in 2014, drug-eluting stent was placed to the LAD. Left heart cath 06/19/2020 patent mid LAD stent, mid to distal LAD 80% disease status post DES. Mild left circumflex and RCA disease,  Echo 05/24/2020 showed normal systolic and diastolic function, EF 60 to 65%  Past Medical History:  Diagnosis Date   Allergy    Arthritis    Asthma    Chest pain on exertion    Diabetes mellitus, type 2 (HCC)    GERD (gastroesophageal reflux disease)    Hyperlipidemia    Vertigo    with sinus issues    Past Surgical History:  Procedure Laterality Date   CARDIAC CATHETERIZATION  2014   stent   CORONARY STENT INTERVENTION  2014   CORONARY STENT INTERVENTION N/A 06/19/2020   Procedure: CORONARY STENT INTERVENTION;  Surgeon: Wellington Hampshire, MD;   Location: [place] [facility];  Service: Cardiovascular;  Laterality: N/A;   ESOPHAGOGASTRODUODENOSCOPY     HERNIA REPAIR     LEFT HEART CATH AND CORONARY ANGIOGRAPHY Left 06/19/2020   Procedure: LEFT HEART CATH AND CORONARY ANGIOGRAPHY;  Surgeon: Wellington Hampshire, MD;  Location: [place] [facility];  Service: Cardiovascular;  Laterality: Left;    Current Medications: Current Meds  Medication Sig   albuterol (VENTOLIN HFA) 108 (90 Base) MCG/ACT inhaler Inhale 2 puffs into the lungs every 6 (six) hours as needed for wheezing or shortness of breath.   aspirin 81 MG tablet Take 81 mg by mouth daily.   carvedilol (COREG) 25 MG tablet Take 1 tablet (25 mg total) by mouth 2 (two) times daily with a meal.   cyclobenzaprine (FLEXERIL) 10 MG tablet Take 0.5-1 tablets (5-10 mg total) by mouth 3 (three) times daily as needed for muscle spasms.   DULoxetine (CYMBALTA) 60 MG capsule Take 1 capsule (60 mg total) by mouth daily.   ezetimibe (ZETIA) 10 MG tablet Take 1 tablet (10 mg total) by mouth daily.   fenofibrate 160 MG tablet Take 1 tablet by mouth once daily   fluticasone (FLONASE) 50 MCG/ACT nasal spray Place 1 spray into both nostrils daily as needed for allergies or rhinitis.   gabapentin (NEURONTIN) 600 MG tablet Take 1 tablet (600 mg total) by mouth at  bedtime.   isosorbide mononitrate (IMDUR) 30 MG 24 hr tablet Take 0.5 tablets (15 mg total) by mouth daily.   lisinopril-hydrochlorothiazide (ZESTORETIC) 10-12.5 MG tablet Take 1 tablet by mouth daily.   metFORMIN (GLUCOPHAGE-XR) 750 MG 24 hr tablet Take 1 tablet (750 mg total) by mouth daily with breakfast.   pantoprazole (PROTONIX) 40 MG tablet Take 1 tablet (40 mg total) by mouth daily before breakfast.   pseudoephedrine-acetaminophen (TYLENOL SINUS) 30-500 MG TABS tablet Take 2 tablets by mouth 2 (two) times daily as needed (congestion).   traZODone (DESYREL) 100 MG tablet Take 1 tablet (100 mg total) by mouth at bedtime.   [DISCONTINUED]  clopidogrel (PLAVIX) 75 MG tablet Take 1 tablet (75 mg total) by mouth daily.     Allergies:   Crestor [rosuvastatin]   Social History   Socioeconomic History   Marital status: Married    Spouse name: Marcie Bal    Number of children: 2   Years of education: Not on file   Highest education level: Not on file  Occupational History   Not on file  Tobacco Use   Smoking status: Every Day    Packs/day: 2.00    Years: 40.00    Total pack years: 80.00    Types: Cigarettes   Smokeless tobacco: Former   Tobacco comments:    1PPD   Vaping Use   Vaping Use: Never used  Substance and Sexual Activity   Alcohol use: Yes    Alcohol/week: 0.0 standard drinks of alcohol    Comment: rare   Drug use: No   Sexual activity: Not on file  Other Topics Concern   Not on file  Social History Narrative   Lives at home with wife   Social Determinants of Health   Financial Resource Strain: Not on file  Food Insecurity: Not on file  Transportation Needs: Not on file  Physical Activity: Not on file  Stress: Not on file  Social Connections: Not on file     Family History: The patient's family history includes Diabetes in his mother; Emphysema in his father.  ROS:   Please see the history of present illness.     All other systems reviewed and are negative.  EKGs/Labs/Other Studies Reviewed:    The following studies were reviewed today:   EKG:  EKG not  ordered today.    Recent Labs: 04/06/2021: TSH 0.96 11/30/2021: ALT 44; BUN 12; Creat 0.88; Hemoglobin 15.4; Platelets 301; Potassium 4.5; Sodium 134  Recent Lipid Panel    Component Value Date/Time   CHOL 132 10/08/2021 1028   CHOL 163 06/23/2015 0933   CHOL 209 (H) 02/28/2013 0324   TRIG 192 (H) 10/08/2021 1028   TRIG 247 (H) 02/28/2013 0324   HDL 35 (L) 10/08/2021 1028   HDL 41 06/23/2015 0933   HDL 38 (L) 02/28/2013 0324   CHOLHDL 3.8 10/08/2021 1028   VLDL 38 10/08/2021 1028   VLDL 49 (H) 02/28/2013 0324   LDLCALC 59  10/08/2021 1028   LDLCALC 123 (H) 04/06/2021 0910   LDLCALC 122 (H) 02/28/2013 0324     Risk Assessment/Calculations:      Physical Exam:    VS:  BP 122/70 (BP Location: Left Arm, Patient Position: Sitting, Cuff Size: Normal)   Pulse 91   Ht '5\' 9"'$  (1.753 m)   Wt 185 lb (83.9 kg)   SpO2 96%   BMI 27.32 kg/m     Wt Readings from Last 3 Encounters:  12/13/21 185 lb (83.9  kg)  11/30/21 178 lb 9.6 oz (81 kg)  11/05/21 185 lb (83.9 kg)     GEN:  Well nourished, well developed in no acute distress HEENT: Normal NECK: No JVD; No carotid bruits CARDIAC: RRR, no murmurs, rubs, gallops RESPIRATORY:  Clear to auscultation without rales, wheezing or rhonchi  ABDOMEN: Soft, non-tender, non-distended MUSCULOSKELETAL:  No edema; No deformity  SKIN: Warm and dry NEUROLOGIC:  Alert and oriented x 3 PSYCHIATRIC:  Normal affect   ASSESSMENT:    1. Coronary artery disease involving native coronary artery of native heart, unspecified whether angina present   2. Mixed hyperlipidemia   3. Primary hypertension   4. Smoking    PLAN:    In order of problems listed above:  CAD/MI s/p DES to LAD x 2.  Myoview with no significant ischemia, low risk study.  Chest pain improved with Imdur.  Continue Imdur, continue aspirin, Plavix, Coreg.  Last echo with preserved ejection fraction.  Hyperlipidemia, elevated triglycerides, goal LDL less than 70.  Patient has myalgias with statin.  Advised to take Praluent as prescribed.  Continue Zetia, Vascepa, fenofibrate.  Repeat lipid panel in 4 months. hypertension, BP controlled.  Continue Coreg, lisinopril, HCTZ. Current smoker, smoking cessation advised.   Follow-up in 6 months  Medication Adjustments/Labs and Tests Ordered: Current medicines are reviewed at length with the patient today.  Concerns regarding medicines are outlined above.  Orders Placed This Encounter  Procedures   Lipid panel   Meds ordered this encounter  Medications    Alirocumab (PRALUENT) 150 MG/ML SOAJ    Sig: Inject 1 pen  into the skin every 14 (fourteen) days.    Dispense:  2 mL    Refill:  11   clopidogrel (PLAVIX) 75 MG tablet    Sig: Take 1 tablet (75 mg total) by mouth daily.    Dispense:  90 tablet    Refill:  0    Patient Instructions  Medication Instructions:    START taking Praluent auto inject once every 14 days.  *If you need a refill on your cardiac medications before your next appointment, please call your pharmacy*   Lab Work:  Your physician recommends that you return for a FASTING lipid profile: in 4 months  - You will need to be fasting. Please do not have anything to eat or drink after midnight the morning you have the lab work. You may only have water or black coffee with no cream or sugar.   - Please go to the [facility]. You will check in at the front desk to the right as you walk into the atrium. Valet Parking is offered if needed. - No appointment needed. You may go any day between 7 am and 6 pm.     Follow-Up: At [facility], you and your health needs are our priority.  As part of our continuing mission to provide you with exceptional heart care, we have created designated Provider Care Teams.  These Care Teams include your primary Cardiologist (physician) and Advanced Practice Providers (APPs -  Physician Assistants and Nurse Practitioners) who all work together to provide you with the care you need, when you need it.  We recommend signing up for the patient portal called "MyChart".  Sign up information is provided on this After Visit Summary.  MyChart is used to connect with patients for Virtual Visits (Telemedicine).  Patients are able to view lab/test results, encounter notes, upcoming appointments, etc.  Non-urgent messages can be sent  to your provider as well.   To learn more about what you can do with MyChart, go to [email or web address].    Your next appointment:   6 month(s)  The format for  your next appointment:   In Person  Provider:   You may see Kate Sable, MD or one of the following Advanced Practice Providers on your designated Care Team:   Murray Hodgkins, NP Christell Faith, PA-C Cadence Kathlen Mody, [place]    Other Instructions   Important Information About Sugar         Signed, Kate Sable, MD  12/13/2021 10:56 AM    [place]

## 2021-12-14 ENCOUNTER — Telehealth: Payer: Self-pay

## 2021-12-14 NOTE — Telephone Encounter (Signed)
Prior Authorization sent for Clifford Morse (Key: [id number]) [id number] Praluent '150MG'$ /ML auto-injectors Status: Sent To PlanCreated: June 15th, 2023 [phone] Sent: June 16th, 2023  Awaiting approval.

## 2021-12-18 ENCOUNTER — Telehealth: Payer: Self-pay | Admitting: *Deleted

## 2021-12-18 NOTE — Telephone Encounter (Signed)
-----   Message from Valora Corporal, RN sent at 10/15/2021  4:48 PM EDT ----- Lipid Liver

## 2021-12-18 NOTE — Telephone Encounter (Signed)
Patient due for Lipid & Liver panel to be done. Will send him My Chart message 6/20 and make sure he reads it.

## 2021-12-20 NOTE — Telephone Encounter (Addendum)
I have tried to follow up on the plan as in [email or web address] suggested. I was not successful getting through to the plan. Can you please take a look at this key: [id number] as we have not yet received a denial or approval?

## 2021-12-20 NOTE — Telephone Encounter (Signed)
I looked at the request and got this response:  "Our records indicate that this request has already been sent to the patient's insurance plan. To follow up, please call the plan directly."

## 2021-12-21 NOTE — Telephone Encounter (Addendum)
Faxed Labs and notes to the prior authorization department at [phone] for approval of Praluent 150 MG/ML Auto injectors.

## 2022-01-11 ENCOUNTER — Other Ambulatory Visit: Payer: Self-pay | Admitting: Family Medicine

## 2022-01-11 DIAGNOSIS — K219 Gastro-esophageal reflux disease without esophagitis: Secondary | ICD-10-CM

## 2022-01-11 NOTE — Telephone Encounter (Signed)
Requested Prescriptions  Pending Prescriptions Disp Refills  . pantoprazole (PROTONIX) 40 MG tablet [Pharmacy Med Name: Pantoprazole Sodium 40 MG Oral Tablet Delayed Release] 90 tablet 0    Sig: TAKE 1 TABLET BY MOUTH ONCE DAILY BEFORE BREAKFAST     Gastroenterology: Proton Pump Inhibitors Passed - 01/11/2022 12:46 PM      Passed - Valid encounter within last 12 months    Recent Outpatient Visits          1 month ago Chronic myofascial pain   [facility] [place], Devonne Doughty, DO   2 months ago Type 2 diabetes mellitus with other specified complication, without long-term current use of insulin ([place])   [facility], Devonne Doughty, DO   9 months ago Annual physical exam   [place], Devonne Doughty, DO   1 year ago Type 2 diabetes mellitus with other specified complication, without long-term current use of insulin [facility])   [facility], Devonne Doughty, DO   1 year ago Type 2 diabetes mellitus with other specified complication, without long-term current use of insulin ([place])   [place], Devonne Doughty, DO      Future Appointments            In 2 months Ag[place]-Et[place], Aaron Edelman, MD [facility], [facility]   In 3 months [place], Devonne Doughty, DO [facility], [place]   In 5 months Sharolyn Douglas, Clance Boll, NP [facility], [place]

## 2022-01-18 ENCOUNTER — Other Ambulatory Visit: Payer: Self-pay | Admitting: Physician Assistant

## 2022-01-18 ENCOUNTER — Other Ambulatory Visit: Payer: Self-pay | Admitting: *Deleted

## 2022-01-18 MED ORDER — ICOSAPENT ETHYL 1 G PO CAPS: 2.0000 g | ORAL_CAPSULE | Freq: Two times a day (BID) | ORAL | 2 refills | Status: DC

## 2022-01-21 ENCOUNTER — Other Ambulatory Visit: Payer: Self-pay | Admitting: Family Medicine

## 2022-01-21 DIAGNOSIS — M62830 Muscle spasm of back: Secondary | ICD-10-CM

## 2022-01-22 NOTE — Telephone Encounter (Signed)
Requested medication (s) are due for refill today - yes  Requested medication (s) are on the active medication list -yes  Future visit scheduled -yes  Last refill: 04/13/22 #60 2RF  Notes to clinic: non delegated Rx  Requested Prescriptions  Pending Prescriptions Disp Refills   cyclobenzaprine (FLEXERIL) 10 MG tablet [Pharmacy Med Name: Cyclobenzaprine HCl 10 MG Oral Tablet] 60 tablet 0    Sig: TAKE ONE-HALF TO ONE TABLET BY MOUTH THREE TIMES DAILY AS NEEDED FOR MUSCLE SPASM     Not Delegated - Analgesics:  Muscle Relaxants Failed - 01/21/2022 11:51 AM      Failed - This refill cannot be delegated      Passed - Valid encounter within last 6 months    Recent Outpatient Visits           1 month ago Chronic myofascial pain   [place], DO   2 months ago Type 2 diabetes mellitus with other specified complication, without long-term current use of insulin ([place])   [facility] [place], Devonne Doughty, DO   9 months ago Annual physical exam   [place], Devonne Doughty, DO   1 year ago Type 2 diabetes mellitus with other specified complication, without long-term current use of insulin ([place])   [facility] [place], Devonne Doughty, DO   1 year ago Type 2 diabetes mellitus with other specified complication, without long-term current use of insulin ([place])   [facility] [place], Devonne Doughty, DO       Future Appointments             In 2 months Agbor-Etang, Aaron Edelman, MD [facility], [facility]   In 3 months [place], Devonne Doughty, [facility], [place]   In 4 months Sharolyn Douglas, Clance Boll, NP [facility], [facility]               Requested Prescriptions  Pending Prescriptions Disp Refills   cyclobenzaprine (FLEXERIL) 10 MG tablet [Pharmacy Med Name: Cyclobenzaprine HCl 10 MG Oral Tablet] 60 tablet 0    Sig: TAKE  ONE-HALF TO ONE TABLET BY MOUTH THREE TIMES DAILY AS NEEDED FOR MUSCLE SPASM     Not Delegated - Analgesics:  Muscle Relaxants Failed - 01/21/2022 11:51 AM      Failed - This refill cannot be delegated      Passed - Valid encounter within last 6 months    Recent Outpatient Visits           1 month ago Chronic myofascial pain   [facility] [place], Devonne Doughty, DO   2 months ago Type 2 diabetes mellitus with other specified complication, without long-term current use of insulin ([place])   [facility], Devonne Doughty, DO   9 months ago Annual physical exam   [place], Devonne Doughty, DO   1 year ago Type 2 diabetes mellitus with other specified complication, without long-term current use of insulin [facility])   [facility] [place], Devonne Doughty, DO   1 year ago Type 2 diabetes mellitus with other specified complication, without long-term current use of insulin [facility])   [facility] [place], Devonne Doughty, DO       Future Appointments             In 2 months Agbor-Etang, Aaron Edelman, MD [facility], [place]   In 3 months [place],  Devonne Doughty, DO [facility], [place]   In 4 months Sharolyn Douglas, Clance Boll, NP [facility], [place]

## 2022-03-12 ENCOUNTER — Other Ambulatory Visit: Payer: Self-pay

## 2022-03-12 MED ORDER — FENOFIBRATE 160 MG PO TABS: 160.0000 mg | ORAL_TABLET | Freq: Every day | ORAL | 0 refills | Status: DC

## 2022-03-12 MED ORDER — CLOPIDOGREL BISULFATE 75 MG PO TABS: 75.0000 mg | ORAL_TABLET | Freq: Every day | ORAL | 0 refills | Status: DC

## 2022-03-25 ENCOUNTER — Encounter: Payer: Self-pay | Admitting: Cardiology

## 2022-03-25 ENCOUNTER — Ambulatory Visit: Payer: 59 | Attending: Cardiology | Admitting: Cardiology

## 2022-03-25 VITALS — BP 130/72 | HR 91 | Ht 69.0 in | Wt 178.6 lb

## 2022-03-25 DIAGNOSIS — R69 Illness, unspecified: Secondary | ICD-10-CM | POA: Diagnosis not present

## 2022-03-25 DIAGNOSIS — I2510 Atherosclerotic heart disease of native coronary artery without angina pectoris: Secondary | ICD-10-CM | POA: Diagnosis not present

## 2022-03-25 DIAGNOSIS — F17200 Nicotine dependence, unspecified, uncomplicated: Secondary | ICD-10-CM | POA: Diagnosis not present

## 2022-03-25 DIAGNOSIS — E782 Mixed hyperlipidemia: Secondary | ICD-10-CM | POA: Diagnosis not present

## 2022-03-25 DIAGNOSIS — I10 Essential (primary) hypertension: Secondary | ICD-10-CM | POA: Diagnosis not present

## 2022-03-25 MED ORDER — ROSUVASTATIN CALCIUM 20 MG PO TABS: 20.0000 mg | ORAL_TABLET | Freq: Every day | ORAL | Status: DC

## 2022-03-25 NOTE — Patient Instructions (Signed)
Medication Instructions:  Your physician recommends that you continue on your current medications as directed. Please refer to the Current Medication list given to you today.  *If you need a refill on your cardiac medications before your next appointment, please call your pharmacy*   Lab Work: Your physician recommends that you return for a FASTING lipid profile: in 4 weeks  Please have your lab drawn at the [facility]. No appt needed. Lab hours are Mon-Fri 7am-6pm. Stop at the Registration desk to check in.   If you have labs (blood work) drawn today and your tests are completely normal, you will receive your results only by: [place] (if you have MyChart) OR A paper copy in the mail If you have any lab test that is abnormal or we need to change your treatment, we will call you to review the results.   Testing/Procedures: None ordered   Follow-Up: At [facility], you and your health needs are our priority.  As part of our continuing mission to provide you with exceptional heart care, we have created designated Provider Care Teams.  These Care Teams include your primary Cardiologist (physician) and Advanced Practice Providers (APPs -  Physician Assistants and Nurse Practitioners) who all work together to provide you with the care you need, when you need it.  We recommend signing up for the patient portal called "MyChart".  Sign up information is provided on this After Visit Summary.  MyChart is used to connect with patients for Virtual Visits (Telemedicine).  Patients are able to view lab/test results, encounter notes, upcoming appointments, etc.  Non-urgent messages can be sent to your provider as well.   To learn more about what you can do with MyChart, go to [email or web address].    Your next appointment:   6 month(s)  The format for your next appointment:   In Person  Provider:   You may see Kate Sable, MD or one of the following Advanced  Practice Providers on your designated Care Team:   Murray Hodgkins, NP Christell Faith, PA-C Cadence Kathlen Mody, PA-C Gerrie Nordmann, NP    Other Instructions N/A  Important Information About Sugar

## 2022-03-25 NOTE — Progress Notes (Signed)
Cardiology Office Note:    Date:  03/25/2022   ID:  Clifford Morse, DOB 07-13-1960, MRN [id number]  PCP:  Olin Hauser, DO  [facility] Cardiologist:  Kate Sable, MD  [facility] Electrophysiologist:  None   Referring MD: Nobie Putnam *   Chief Complaint  Patient presents with   Follow-up    6 month follow up, no new Cardiac concerns     History of Present Illness:    Clifford Morse is a 61 y.o. male with a hx of MI/CAD (s/p DES to mid LAD 03/2013, recent mid to distal LAD stent 06/19/2020), hypertension, hyperlipidemia, current smoker x35+ years, COPD who presents for follow-up.   Being seen for CAD, hypertension, mixed hyperlipidemia.  Previously did not tolerate statins with muscle aches, Praluent and Zetia was prescribed, patient states not taking Praluent or Zetia.  Restarted Crestor 20 mg daily without any adverse effects.  Also states taking fenofibrate and Vascepa.  He denies chest pain, has not taken Imdur for some time now.  Overall feels well, doing okay, would like to exercise more.   Prior notes Had an MI in 2014, drug-eluting stent was placed to the LAD. Left heart cath 06/19/2020 patent mid LAD stent, mid to distal LAD 80% disease status post DES. Mild left circumflex and RCA disease,  Echo 05/24/2020 showed normal systolic and diastolic function, EF 60 to 65%  Past Medical History:  Diagnosis Date   Allergy    Arthritis    Asthma    Chest pain on exertion    Diabetes mellitus, type 2 (HCC)    GERD (gastroesophageal reflux disease)    Hyperlipidemia    Vertigo    with sinus issues    Past Surgical History:  Procedure Laterality Date   CARDIAC CATHETERIZATION  2014   stent   CORONARY STENT INTERVENTION  2014   CORONARY STENT INTERVENTION N/A 06/19/2020   Procedure: CORONARY STENT INTERVENTION;  Surgeon: Wellington Hampshire, MD;  Location: [facility];  Service: Cardiovascular;  Laterality: N/A;    ESOPHAGOGASTRODUODENOSCOPY     HERNIA REPAIR     LEFT HEART CATH AND CORONARY ANGIOGRAPHY Left 06/19/2020   Procedure: LEFT HEART CATH AND CORONARY ANGIOGRAPHY;  Surgeon: Wellington Hampshire, MD;  Location: [place] [facility];  Service: Cardiovascular;  Laterality: Left;    Current Medications: Current Meds  Medication Sig   albuterol (VENTOLIN HFA) 108 (90 Base) MCG/ACT inhaler Inhale 2 puffs into the lungs every 6 (six) hours as needed for wheezing or shortness of breath.   aspirin 81 MG tablet Take 81 mg by mouth daily.   carvedilol (COREG) 25 MG tablet Take 1 tablet (25 mg total) by mouth 2 (two) times daily with a meal.   clopidogrel (PLAVIX) 75 MG tablet Take 1 tablet (75 mg total) by mouth daily.   cyclobenzaprine (FLEXERIL) 10 MG tablet TAKE ONE-HALF TO ONE TABLET BY MOUTH THREE TIMES DAILY AS NEEDED FOR MUSCLE SPASM   DULoxetine (CYMBALTA) 60 MG capsule Take 1 capsule (60 mg total) by mouth daily.   fenofibrate 160 MG tablet Take 1 tablet (160 mg total) by mouth daily.   fluticasone (FLONASE) 50 MCG/ACT nasal spray Place 1 spray into both nostrils daily as needed for allergies or rhinitis.   gabapentin (NEURONTIN) 600 MG tablet Take 1 tablet (600 mg total) by mouth at bedtime.   icosapent Ethyl (VASCEPA) 1 g capsule Take 2 capsules (2 g total) by mouth 2 (two) times daily  for 180 doses.   lisinopril-hydrochlorothiazide (ZESTORETIC) 10-12.5 MG tablet Take 1 tablet by mouth daily.   metFORMIN (GLUCOPHAGE-XR) 750 MG 24 hr tablet Take 1 tablet (750 mg total) by mouth daily with breakfast.   pantoprazole (PROTONIX) 40 MG tablet TAKE 1 TABLET BY MOUTH ONCE DAILY BEFORE BREAKFAST   pseudoephedrine-acetaminophen (TYLENOL SINUS) 30-500 MG TABS tablet Take 2 tablets by mouth 2 (two) times daily as needed (congestion).   rosuvastatin (CRESTOR) 20 MG tablet Take 1 tablet (20 mg total) by mouth daily.   traZODone (DESYREL) 100 MG tablet Take 1 tablet (100 mg total) by mouth at bedtime.      Allergies:   Crestor [rosuvastatin]   Social History   Socioeconomic History   Marital status: Married    Spouse name: Marcie Bal    Number of children: 2   Years of education: Not on file   Highest education level: Not on file  Occupational History   Not on file  Tobacco Use   Smoking status: Every Day    Packs/day: 2.00    Years: 40.00    Total pack years: 80.00    Types: Cigarettes   Smokeless tobacco: Former   Tobacco comments:    1PPD   Vaping Use   Vaping Use: Never used  Substance and Sexual Activity   Alcohol use: Yes    Alcohol/week: 0.0 standard drinks of alcohol    Comment: rare   Drug use: No   Sexual activity: Not on file  Other Topics Concern   Not on file  Social History Narrative   Lives at home with wife   Social Determinants of Health   Financial Resource Strain: Not on file  Food Insecurity: Not on file  Transportation Needs: Not on file  Physical Activity: Not on file  Stress: Not on file  Social Connections: Not on file     Family History: The patient's family history includes Diabetes in his mother; Emphysema in his father.  ROS:   Please see the history of present illness.     All other systems reviewed and are negative.  EKGs/Labs/Other Studies Reviewed:    The following studies were reviewed today:   EKG:  EKG not  ordered today.    Recent Labs: 04/06/2021: TSH 0.96 11/30/2021: ALT 44; BUN 12; Creat 0.88; Hemoglobin 15.4; Platelets 301; Potassium 4.5; Sodium 134  Recent Lipid Panel    Component Value Date/Time   CHOL 132 10/08/2021 1028   CHOL 163 06/23/2015 0933   CHOL 209 (H) 02/28/2013 0324   TRIG 192 (H) 10/08/2021 1028   TRIG 247 (H) 02/28/2013 0324   HDL 35 (L) 10/08/2021 1028   HDL 41 06/23/2015 0933   HDL 38 (L) 02/28/2013 0324   CHOLHDL 3.8 10/08/2021 1028   VLDL 38 10/08/2021 1028   VLDL 49 (H) 02/28/2013 0324   LDLCALC 59 10/08/2021 1028   LDLCALC 123 (H) 04/06/2021 0910   LDLCALC 122 (H) 02/28/2013 0324      Risk Assessment/Calculations:      Physical Exam:    VS:  BP 130/72 (BP Location: Left Arm, Patient Position: Sitting, Cuff Size: Normal)   Pulse 91   Ht '5\' 9"'$  (1.753 m)   Wt 178 lb 9.6 oz (81 kg)   SpO2 95%   BMI 26.37 kg/m     Wt Readings from Last 3 Encounters:  03/25/22 178 lb 9.6 oz (81 kg)  12/13/21 185 lb (83.9 kg)  11/30/21 178 lb 9.6 oz (81 kg)  GEN:  Well nourished, well developed in no acute distress HEENT: Normal NECK: No JVD; No carotid bruits CARDIAC: RRR, no murmurs, rubs, gallops RESPIRATORY:  Clear to auscultation without rales, wheezing or rhonchi  ABDOMEN: Soft, non-tender, non-distended MUSCULOSKELETAL:  No edema; No deformity  SKIN: Warm and dry NEUROLOGIC:  Alert and oriented x 3 PSYCHIATRIC:  Normal affect   ASSESSMENT:    1. Coronary artery disease involving native coronary artery of native heart, unspecified whether angina present   2. Mixed hyperlipidemia   3. Primary hypertension   4. Smoking    PLAN:    In order of problems listed above:  CAD/MI s/p DES to LAD x 2.  Denies chest pain, last EF preserved.  Continue aspirin, Plavix, Coreg, Crestor, fenofibrate..  Hyperlipidemia, elevated triglycerides, goal LDL less than 70.  Check lipid panel 1 month.  Continue Vascepa, fenofibrate, Crestor 20 mg daily. hypertension, BP controlled.  Continue Coreg, lisinopril, HCTZ. Current smoker, smoking cessation advised.  Follow-up in 6 months  Medication Adjustments/Labs and Tests Ordered: Current medicines are reviewed at length with the patient today.  Concerns regarding medicines are outlined above.  Orders Placed This Encounter  Procedures   Lipid panel   EKG 12-Lead   Meds ordered this encounter  Medications   rosuvastatin (CRESTOR) 20 MG tablet    Sig: Take 1 tablet (20 mg total) by mouth daily.    Patient Instructions  Medication Instructions:  Your physician recommends that you continue on your current medications as  directed. Please refer to the Current Medication list given to you today.  *If you need a refill on your cardiac medications before your next appointment, please call your pharmacy*   Lab Work: Your physician recommends that you return for a FASTING lipid profile: in 4 weeks  Please have your lab drawn at the [facility]. No appt needed. Lab hours are Mon-Fri 7am-6pm. Stop at the Registration desk to check in.   If you have labs (blood work) drawn today and your tests are completely normal, you will receive your results only by: [place] (if you have MyChart) OR A paper copy in the mail If you have any lab test that is abnormal or we need to change your treatment, we will call you to review the results.   Testing/Procedures: None ordered   Follow-Up: At [facility], you and your health needs are our priority.  As part of our continuing mission to provide you with exceptional heart care, we have created designated Provider Care Teams.  These Care Teams include your primary Cardiologist (physician) and Advanced Practice Providers (APPs -  Physician Assistants and Nurse Practitioners) who all work together to provide you with the care you need, when you need it.  We recommend signing up for the patient portal called "MyChart".  Sign up information is provided on this After Visit Summary.  MyChart is used to connect with patients for Virtual Visits (Telemedicine).  Patients are able to view lab/test results, encounter notes, upcoming appointments, etc.  Non-urgent messages can be sent to your provider as well.   To learn more about what you can do with MyChart, go to [email or web address].    Your next appointment:   6 month(s)  The format for your next appointment:   In Person  Provider:   You may see Kate Sable, MD or one of the following Advanced Practice Providers on your designated Care Team:   Murray Hodgkins, NP Christell Faith, PA-C Cadence Kathlen Mody,  PA-C Gerrie Nordmann, NP    Other Instructions N/A  Important Information About Sugar         Signed, Kate Sable, MD  03/25/2022 5:17 PM    [place]

## 2022-04-03 ENCOUNTER — Ambulatory Visit: Payer: Self-pay

## 2022-04-03 NOTE — Patient Outreach (Signed)
  Care Coordination   Initial Visit Note   04/03/2022 Name: DYWANE PERUSKI MRN: [id number] DOB: 01/10/1961  KIRUBEL AJA is a 61 y.o. year old male who sees Olin Hauser, DO for primary care. I spoke with  [name] by phone today.  What matters to the patients health and wellness today?  The patient denies any needs at this time. Review of the goals of the program and if the patient needs resources in the future.    Goals Addressed             This Visit's Progress    COMPLETED: RNCM: Health and well being       Care Coordination Interventions: Evaluation of current treatment plan related to chronic conditions and patient's adherence to plan as established by provider Advised patient to call the [facility] for changes in conditions, new needs and questions Provided education to patient re: care coordination program and the available resources of the care coordination program Advised patient to discuss changes in chronic conditions with provider Assessed social determinant of health barriers           SDOH assessments and interventions completed:  Yes  SDOH Interventions Today    Flowsheet Row Most Recent Value  SDOH Interventions   Food Insecurity Interventions Intervention Not Indicated  Housing Interventions Intervention Not Indicated  Transportation Interventions Intervention Not Indicated  Utilities Interventions Intervention Not Indicated        Care Coordination Interventions Activated:  Yes  Care Coordination Interventions:  Yes, provided   Follow up plan: No further intervention required.   Encounter Outcome:  Pt. Visit Completed   Noreene Larsson RN, MSN, [place]  Mobile: [phone]

## 2022-04-03 NOTE — Patient Instructions (Signed)
Visit Information  Thank you for taking time to visit with me today. Please don't hesitate to contact me if I can be of assistance to you.   Following are the goals we discussed today:   Goals Addressed             This Visit's Progress    COMPLETED: RNCM: Health and well being       Care Coordination Interventions: Evaluation of current treatment plan related to chronic conditions and patient's adherence to plan as established by provider Advised patient to call the [facility] for changes in conditions, new needs and questions Provided education to patient re: care coordination program and the available resources of the care coordination program Advised patient to discuss changes in chronic conditions with provider Assessed social determinant of health barriers             Please call the care guide team at [phone] if you need to schedule an appointment.   If you are experiencing a Mental Health or [place] or need someone to talk to, please call the Suicide and Crisis Lifeline: 988 call the [place] National Suicide Prevention Lifeline: (251)370-6636 or TTY: 916-880-4835 TTY (905)260-5349) to talk to a trained counselor call 1-800-273-TALK (toll free, 24 hour hotline)  Patient verbalizes understanding of instructions and care plan provided today and agrees to view in [place]. Active MyChart status and patient understanding of how to access instructions and care plan via MyChart confirmed with patient.     No further follow up required: the patient denies any needs at this time. The patient knows how to reach the [facility].   Noreene Larsson RN, MSN, [facility]  Mobile: [phone]

## 2022-04-07 ENCOUNTER — Other Ambulatory Visit: Payer: Self-pay | Admitting: Physician Assistant

## 2022-04-07 DIAGNOSIS — I10 Essential (primary) hypertension: Secondary | ICD-10-CM

## 2022-04-12 ENCOUNTER — Ambulatory Visit: Payer: 59 | Attending: Internal Medicine | Admitting: Internal Medicine

## 2022-04-12 ENCOUNTER — Other Ambulatory Visit: Payer: Self-pay

## 2022-04-12 ENCOUNTER — Other Ambulatory Visit
Admission: RE | Admit: 2022-04-12 | Discharge: 2022-04-12 | Disposition: A | Discharge status: Home / Self Care | Payer: 59 | Attending: Cardiology | Admitting: Cardiology

## 2022-04-12 DIAGNOSIS — E782 Mixed hyperlipidemia: Secondary | ICD-10-CM | POA: Diagnosis not present

## 2022-04-12 LAB — LIPID PANEL
Cholesterol: 119 mg/dL (ref 0–200)
HDL: 30 mg/dL — ABNORMAL LOW (ref >40)
LDL Cholesterol: 39 mg/dL (ref 0–99)
Total CHOL/HDL Ratio: 4 RATIO
Triglycerides: 248 mg/dL — ABNORMAL HIGH (ref <150)
VLDL: 50 mg/dL — ABNORMAL HIGH (ref 0–40)

## 2022-04-12 MED ORDER — ROSUVASTATIN CALCIUM 40 MG PO TABS: 40.0000 mg | ORAL_TABLET | Freq: Every day | ORAL | 3 refills | Status: DC

## 2022-04-12 NOTE — Progress Notes (Signed)
See lipid lab result note.

## 2022-04-12 NOTE — Progress Notes (Deleted)
Office Visit Note  Patient: Clifford Morse             Date of Birth: 1960/11/30           MRN: [id number]             PCP: Olin Hauser, DO Referring: Nobie Putnam * Visit Date: 04/12/2022 Occupation: '@GUAROCC' @  Subjective:  No chief complaint on file.   History of Present Illness: Morse Clifford is a 61 y.o. male here for evaluation of chronic joint and myofascial pains associated with positive rheumatoid factor. He has had extensive screening serology that was negative for ANA or serum inflammatory markers. He has been prescribed multiple medications for symptom management including cymbalta, gabapentin, and flexeril for this. ***   Labs reviewed 11/2021 RF 36 CCP neg ESR 2 CRP 3.3 CBC wnl CMP AST 42  12/03/21 Xray lumbar spine IMPRESSION: 1. Mild multilevel degenerative disc disease. 2. Mild lower lumbar facet hypertrophy.   06/06/20 Xray right knee IMPRESSION: Negative.  06/06/20 Xray hips and pelvis b/l IMPRESSION: Mild degenerative changes of the bilateral hips.   Activities of Daily Living:  Patient reports morning stiffness for *** {minute/hour:19697}.   Patient {ACTIONS;DENIES/REPORTS:21021675::"Denies"} nocturnal pain.  Difficulty dressing/grooming: {ACTIONS;DENIES/REPORTS:21021675::"Denies"} Difficulty climbing stairs: {ACTIONS;DENIES/REPORTS:21021675::"Denies"} Difficulty getting out of chair: {ACTIONS;DENIES/REPORTS:21021675::"Denies"} Difficulty using hands for taps, buttons, cutlery, and/or writing: {ACTIONS;DENIES/REPORTS:21021675::"Denies"}  No Rheumatology ROS completed.   PMFS History:  Patient Active Problem List   Diagnosis Date Noted   Stage 1 mild COPD by GOLD classification ([place]) 04/14/2021   Stable angina 06/19/2020   Abnormal stress ECG    Chronic myofascial pain 06/06/2020   Psychophysiological insomnia 02/14/2020   Background diabetic retinopathy associated with type 2 diabetes mellitus ([place]) 11/13/2018   Anxiety  07/03/2018   Chronic pain syndrome 07/03/2018   Type 2 diabetes mellitus with other specified complication ([place]) 45/62/5638   Hyperlipidemia associated with type 2 diabetes mellitus ([place]) 05/28/2017   CAD (coronary artery disease) 03/21/2016   Skin lesion of face 12/07/2015   Seasonal allergies 12/07/2015   Tobacco abuse 06/20/2015   Major depressive disorder, recurrent, moderate ([place]) 06/20/2015   Fatty infiltration of liver 05/19/2015   GERD (gastroesophageal reflux disease) 05/19/2015   Essential hypertension 05/19/2015   Hx of cardiac catheterization 05/19/2015    Past Medical History:  Diagnosis Date   Allergy    Arthritis    Asthma    Chest pain on exertion    Diabetes mellitus, type 2 (HCC)    GERD (gastroesophageal reflux disease)    Hyperlipidemia    Vertigo    with sinus issues    Family History  Problem Relation Age of Onset   Emphysema Father    Diabetes Mother    Past Surgical History:  Procedure Laterality Date   CARDIAC CATHETERIZATION  2014   stent   CORONARY STENT INTERVENTION  2014   CORONARY STENT INTERVENTION N/A 06/19/2020   Procedure: CORONARY STENT INTERVENTION;  Surgeon: Wellington Hampshire, MD;  Location: [place] [facility];  Service: Cardiovascular;  Laterality: N/A;   ESOPHAGOGASTRODUODENOSCOPY     HERNIA REPAIR     LEFT HEART CATH AND CORONARY ANGIOGRAPHY Left 06/19/2020   Procedure: LEFT HEART CATH AND CORONARY ANGIOGRAPHY;  Surgeon: Wellington Hampshire, MD;  Location: [place] [facility];  Service: Cardiovascular;  Laterality: Left;   Social History   Social History Narrative   Lives at home with wife   Immunization History  Administered Date(s) Administered   Pneumococcal  Polysaccharide-23 07/01/2012   Tdap 07/01/2012     Objective: Vital Signs: There were no vitals taken for this visit.   Physical Exam   Musculoskeletal Exam: ***  CDAI Exam: CDAI Score: -- Patient Global: --; Provider Global: -- Swollen: --; Tender:  -- Joint Exam 04/12/2022   No joint exam has been documented for this visit   There is currently no information documented on the homunculus. Go to the Rheumatology activity and complete the homunculus joint exam.  Investigation: No additional findings.  Imaging: No results found.  Recent Labs: Lab Results  Component Value Date   WBC 9.0 11/30/2021   HGB 15.4 11/30/2021   PLT 301 11/30/2021   NA 134 (L) 11/30/2021   K 4.5 11/30/2021   CL 97 (L) 11/30/2021   CO2 27 11/30/2021   GLUCOSE 155 (H) 11/30/2021   BUN 12 11/30/2021   CREATININE 0.88 11/30/2021   BILITOT 0.5 11/30/2021   ALKPHOS 53 10/08/2021   AST 42 (H) 11/30/2021   ALT 44 11/30/2021   PROT 7.8 11/30/2021   ALBUMIN 4.4 10/08/2021   CALCIUM 9.8 11/30/2021   GFRAA 118 05/22/2020    Speciality Comments: No specialty comments available.  Procedures:  No procedures performed Allergies: Crestor [rosuvastatin]   Assessment / Plan:     Visit Diagnoses: No diagnosis found.  Orders: No orders of the defined types were placed in this encounter.  No orders of the defined types were placed in this encounter.   Face-to-face time spent with patient was *** minutes. Greater than 50% of time was spent in counseling and coordination of care.  Follow-Up Instructions: No follow-ups on file.   Collier Salina, MD  Note - This record has been created using [employer].  Chart creation errors have been sought, but may not always  have been located. Such creation errors do not reflect on  the standard of medical care.

## 2022-04-18 ENCOUNTER — Other Ambulatory Visit: Payer: Self-pay | Admitting: Family Medicine

## 2022-04-18 DIAGNOSIS — G8929 Other chronic pain: Secondary | ICD-10-CM

## 2022-04-18 DIAGNOSIS — G894 Chronic pain syndrome: Secondary | ICD-10-CM

## 2022-04-19 ENCOUNTER — Telehealth: Payer: Self-pay | Admitting: Cardiology

## 2022-04-19 MED ORDER — ROSUVASTATIN CALCIUM 40 MG PO TABS: 40.0000 mg | ORAL_TABLET | Freq: Every day | ORAL | 4 refills | Status: DC

## 2022-04-19 NOTE — Telephone Encounter (Signed)
*  STAT* If patient is at the pharmacy, call can be transferred to refill team.   1. Which medications need to be refilled? (please list name of each medication and dose if known)   rosuvastatin (CRESTOR) 40 MG tablet  2. Which pharmacy/location (including street and city if local pharmacy) is medication to be sent to?  [place] ([address] [place] [address]  3. Do they need a 30 day or 90 day supply?  30 day  Patient stated he is completely out of this medication.

## 2022-04-19 NOTE — Telephone Encounter (Signed)
Requested Prescriptions   Signed Prescriptions Disp Refills   rosuvastatin (CRESTOR) 40 MG tablet 30 tablet 4    Sig: Take 1 tablet (40 mg total) by mouth daily.    Authorizing Provider: Kate Sable    Ordering User: Raelene Bott, Charmayne Odell L

## 2022-04-19 NOTE — Telephone Encounter (Signed)
Requested Prescriptions  Pending Prescriptions Disp Refills  . gabapentin (NEURONTIN) 600 MG tablet [Pharmacy Med Name: Gabapentin 600 MG Oral Tablet] 90 tablet 2    Sig: TAKE 1 TABLET BY MOUTH AT BEDTIME     Neurology: Anticonvulsants - gabapentin Passed - 04/18/2022  6:27 PM      Passed - Cr in normal range and within 360 days    Creat  Date Value Ref Range Status  11/30/2021 0.88 0.70 - 1.35 mg/dL Final         Passed - Completed PHQ-2 or PHQ-9 in the last 360 days      Passed - Valid encounter within last 12 months    Recent Outpatient Visits          4 months ago Chronic myofascial pain   [place], DO   5 months ago Type 2 diabetes mellitus with other specified complication, without long-term current use of insulin ([place])   [place], Devonne Doughty, DO   1 year ago Annual physical exam   [facility] Olin Hauser, DO   1 year ago Type 2 diabetes mellitus with other specified complication, without long-term current use of insulin [facility])   [facility] [place], Devonne Doughty, DO   1 year ago Type 2 diabetes mellitus with other specified complication, without long-term current use of insulin ([place])   [place], Devonne Doughty, DO      Future Appointments            In 2 weeks [place], Devonne Doughty, DO [facility], [place]   In 5 months Kate Sable, MD [place]. [place]

## 2022-04-22 ENCOUNTER — Other Ambulatory Visit: Payer: Self-pay | Admitting: Family Medicine

## 2022-04-22 DIAGNOSIS — E1169 Type 2 diabetes mellitus with other specified complication: Secondary | ICD-10-CM

## 2022-04-22 DIAGNOSIS — F331 Major depressive disorder, recurrent, moderate: Secondary | ICD-10-CM

## 2022-04-23 NOTE — Telephone Encounter (Signed)
Requested Prescriptions  Pending Prescriptions Disp Refills  . DULoxetine (CYMBALTA) 60 MG capsule [Pharmacy Med Name: DULoxetine HCl 60 MG Oral Capsule Delayed Release Particles] 90 capsule 0    Sig: Take 1 capsule by mouth once daily     Psychiatry: Antidepressants - SNRI - duloxetine Passed - 04/22/2022  6:08 AM      Passed - Cr in normal range and within 360 days    Creat  Date Value Ref Range Status  11/30/2021 0.88 0.70 - 1.35 mg/dL Final         Passed - eGFR is 30 or above and within 360 days    GFR, Est African American  Date Value Ref Range Status  04/18/2020 113 > OR = 60 mL/min/1.61m Final   GFR calc Af Amer  Date Value Ref Range Status  05/22/2020 118 >59 mL/min/1.73 Final    Comment:    **In accordance with recommendations from the NKF-ASN Task force,**   [facility] is in the process of updating its eGFR calculation to the   2021 CKD-EPI creatinine equation that estimates kidney function   without a race variable.    GFR, Est Non African American  Date Value Ref Range Status  04/18/2020 97 > OR = 60 mL/min/1.736mFinal   GFR, Estimated  Date Value Ref Range Status  06/20/2020 >60 >60 mL/min Final    Comment:    (NOTE) Calculated using the CKD-EPI Creatinine Equation (2021)    eGFR  Date Value Ref Range Status  11/30/2021 98 > OR = 60 mL/min/1.7386minal    Comment:    The eGFR is based on the CKD-EPI 2021 equation. To calculate  the new eGFR from a previous Creatinine or Cystatin C result, go to https://www.kidney.org/professionals/ kdoqi/gfr%5Fcalculator          Passed - Completed PHQ-2 or PHQ-9 in the last 360 days      Passed - Last BP in normal range    BP Readings from Last 1 Encounters:  03/25/22 130/72         Passed - Valid encounter within last 6 months    Recent Outpatient Visits          4 months ago Chronic myofascial pain   SouWhitneyO   5 months ago Type 2 diabetes mellitus with  other specified complication, without long-term current use of insulin (HCCHaskell SouLincolnleDevonne DoughtyO   1 year ago Annual physical exam   [facility]   1 year ago Type 2 diabetes mellitus with other specified complication, without long-term current use of insulin ([facility]ought[facility]   1 year ago Type 2 diabetes mellitus with other specified complication, without long-term current use of insulin (HCCSchu[facility]leDevonne DoughtyO      Future Appointments            In 1 week KarParks RangerleDevonne DoughtyO [facility] SableD [facility]           . metFORMIN (GLUCOPHAGE-XR) 750 MG 24 hr tablet [Pharmacy Med Name: metFORMIN HCl ER 750 MG Oral Tablet Extended Release 24 Hour] 90 tablet 0    Sig: Take 1 tablet by mouth once daily with breakfast  Endocrinology:  Diabetes - Biguanides Failed - 04/22/2022  6:08 AM      Failed - HBA1C is between 0 and 7.9 and within 180 days    Hemoglobin A1C  Date Value Ref Range Status  02/27/2013 5.9 4.2 - 6.3 % Final    Comment:    The American Diabetes Association recommends that a primary goal of therapy should be <7% and that physicians should reevaluate the treatment regimen in patients with HbA1c values consistently >8%.    Hgb A1c MFr Bld  Date Value Ref Range Status  04/06/2021 6.8 (H) <5.7 % of total Hgb Final    Comment:    For someone without known diabetes, a hemoglobin A1c value of 6.5% or greater indicates that they may have  diabetes and this should be confirmed with a follow-up  test. . For someone with known diabetes, a value <7% indicates  that their diabetes is well controlled and a value  greater than or equal to 7% indicates suboptimal  control. A1c targets should be  individualized based on  duration of diabetes, age, comorbid conditions, and  other considerations. . Currently, no consensus exists regarding use of hemoglobin A1c for diagnosis of diabetes for children. .          Failed - B12 Level in normal range and within 720 days    No results found for: "VITAMINB12"       Passed - Cr in normal range and within 360 days    Creat  Date Value Ref Range Status  11/30/2021 0.88 0.70 - 1.35 mg/dL Final         Passed - eGFR in normal range and within 360 days    GFR, Est African American  Date Value Ref Range Status  04/18/2020 113 > OR = 60 mL/min/1.42m Final   GFR calc Af Amer  Date Value Ref Range Status  05/22/2020 118 >59 mL/min/1.73 Final    Comment:    **In accordance with recommendations from the NKF-ASN Task force,**   [facility] is in the process of updating its eGFR calculation to the   2021 CKD-EPI creatinine equation that estimates kidney function   without a race variable.    GFR, Est Non African American  Date Value Ref Range Status  04/18/2020 97 > OR = 60 mL/min/1.776mFinal   GFR, Estimated  Date Value Ref Range Status  06/20/2020 >60 >60 mL/min Final    Comment:    (NOTE) Calculated using the CKD-EPI Creatinine Equation (2021)    eGFR  Date Value Ref Range Status  11/30/2021 98 > OR = 60 mL/min/1.7378minal    Comment:    The eGFR is based on the CKD-EPI 2021 equation. To calculate  the new eGFR from a previous Creatinine or Cystatin C result, go to https://www.kidney.org/professionals/ kdoqi/gfr%5Fcalculator          Passed - Valid encounter within last 6 months    Recent Outpatient Visits          4 months ago Chronic myofascial pain   [place]O   5 months ago Type 2 diabetes mellitus with other specified complication, without long-term current use of insulin ([facility] SouCashiersO   1 year ago Annual physical  exam   SouStampsO   1 year ago Type 2 diabetes mellitus with other specified complication, without long-term current use of insulin (HCCCol[place]  [place], DO   1 year ago Type 2 diabetes mellitus with other specified complication, without long-term current use of insulin [facility])   [facility] Parks Ranger, Devonne Doughty, DO      Future Appointments            In 1 week Parks Ranger, Devonne Doughty, DO [facility] [place]   In 5 months Kate Sable, MD [place]           Passed - CBC within normal limits and completed in the last 12 months    WBC  Date Value Ref Range Status  11/30/2021 9.0 3.8 - 10.8 Thousand/uL Final   RBC  Date Value Ref Range Status  11/30/2021 4.94 4.20 - 5.80 Million/uL Final   Hemoglobin  Date Value Ref Range Status  11/30/2021 15.4 13.2 - 17.1 g/dL Final  05/22/2020 14.7 13.0 - 17.7 g/dL Final   HCT  Date Value Ref Range Status  11/30/2021 44.5 38.5 - 50.0 % Final   Hematocrit  Date Value Ref Range Status  05/22/2020 41.4 37.5 - 51.0 % Final   MCHC  Date Value Ref Range Status  11/30/2021 34.6 32.0 - 36.0 g/dL Final   [facility]  Date Value Ref Range Status  11/30/2021 31.2 27.0 - 33.0 pg Final   MCV  Date Value Ref Range Status  11/30/2021 90.1 80.0 - 100.0 fL Final  05/22/2020 88 79 - 97 fL Final  02/27/2013 91 80 - 100 fL Final   No results found for: "PLTCOUNTKUC", "LABPLAT", "POCPLA" RDW  Date Value Ref Range Status  11/30/2021 11.9 11.0 - 15.0 % Final  05/22/2020 11.7 11.6 - 15.4 % Final  02/27/2013 12.1 11.5 - 14.5 % Final

## 2022-04-25 ENCOUNTER — Other Ambulatory Visit: Payer: Self-pay

## 2022-04-25 DIAGNOSIS — E1169 Type 2 diabetes mellitus with other specified complication: Secondary | ICD-10-CM

## 2022-04-25 DIAGNOSIS — I10 Essential (primary) hypertension: Secondary | ICD-10-CM

## 2022-04-25 DIAGNOSIS — Z0000 Encounter for general adult medical examination without abnormal findings: Secondary | ICD-10-CM

## 2022-04-25 DIAGNOSIS — R351 Nocturia: Secondary | ICD-10-CM

## 2022-04-26 ENCOUNTER — Other Ambulatory Visit: Payer: 59

## 2022-04-26 DIAGNOSIS — E1169 Type 2 diabetes mellitus with other specified complication: Secondary | ICD-10-CM | POA: Diagnosis not present

## 2022-04-26 DIAGNOSIS — I10 Essential (primary) hypertension: Secondary | ICD-10-CM | POA: Diagnosis not present

## 2022-04-26 DIAGNOSIS — Z0000 Encounter for general adult medical examination without abnormal findings: Secondary | ICD-10-CM | POA: Diagnosis not present

## 2022-04-26 DIAGNOSIS — R351 Nocturia: Secondary | ICD-10-CM | POA: Diagnosis not present

## 2022-04-26 DIAGNOSIS — E785 Hyperlipidemia, unspecified: Secondary | ICD-10-CM | POA: Diagnosis not present

## 2022-04-27 LAB — CBC WITH DIFFERENTIAL/PLATELET
Absolute Monocytes: 484 cells/uL (ref 200–950)
Basophils Absolute: 74 cells/uL (ref 0–200)
Basophils Relative: 0.9 %
Eosinophils Absolute: 279 cells/uL (ref 15–500)
Eosinophils Relative: 3.4 %
HCT: 42.5 % (ref 38.5–50.0)
Hemoglobin: 14.9 g/dL (ref 13.2–17.1)
Lymphs Abs: 2911 cells/uL (ref 850–3900)
MCH: 31.6 pg (ref 27.0–33.0)
MCHC: 35.1 g/dL (ref 32.0–36.0)
MCV: 90.2 fL (ref 80.0–100.0)
MPV: 9.1 fL (ref 7.5–12.5)
Monocytes Relative: 5.9 %
Neutro Abs: 4453 cells/uL (ref 1500–7800)
Neutrophils Relative %: 54.3 %
Platelets: 267 10*3/uL (ref 140–400)
RBC: 4.71 10*6/uL (ref 4.20–5.80)
RDW: 11.6 % (ref 11.0–15.0)
Total Lymphocyte: 35.5 %
WBC: 8.2 10*3/uL (ref 3.8–10.8)

## 2022-04-27 LAB — HEMOGLOBIN A1C
Hgb A1c MFr Bld: 10.7 % of total Hgb — ABNORMAL HIGH (ref <5.7)
Mean Plasma Glucose: 260 mg/dL
eAG (mmol/L): 14.4 mmol/L

## 2022-04-27 LAB — COMPLETE METABOLIC PANEL WITH GFR
AG Ratio: 1.6 (calc) (ref 1.0–2.5)
ALT: 33 U/L (ref 9–46)
AST: 41 U/L — ABNORMAL HIGH (ref 10–35)
Albumin: 4.3 g/dL (ref 3.6–5.1)
Alkaline phosphatase (APISO): 71 U/L (ref 35–144)
BUN: 11 mg/dL (ref 7–25)
CO2: 26 mmol/L (ref 20–32)
Calcium: 9.2 mg/dL (ref 8.6–10.3)
Chloride: 99 mmol/L (ref 98–110)
Creat: 0.81 mg/dL (ref 0.70–1.35)
Globulin: 2.7 g/dL (calc) (ref 1.9–3.7)
Glucose, Bld: 231 mg/dL — ABNORMAL HIGH (ref 65–99)
Potassium: 4.3 mmol/L (ref 3.5–5.3)
Sodium: 134 mmol/L — ABNORMAL LOW (ref 135–146)
Total Bilirubin: 0.6 mg/dL (ref 0.2–1.2)
Total Protein: 7 g/dL (ref 6.1–8.1)
eGFR: 100 mL/min/{1.73_m2} (ref >=60)

## 2022-04-27 LAB — LIPID PANEL
Cholesterol: 100 mg/dL (ref <200)
HDL: 31 mg/dL — ABNORMAL LOW (ref >=40)
LDL Cholesterol (Calc): 42 mg/dL (calc)
Non-HDL Cholesterol (Calc): 69 mg/dL (calc) (ref <130)
Total CHOL/HDL Ratio: 3.2 (calc) (ref <5.0)
Triglycerides: 204 mg/dL — ABNORMAL HIGH (ref <150)

## 2022-04-27 LAB — PSA: PSA: 0.7 ng/mL (ref <=4.00)

## 2022-04-27 LAB — TSH: TSH: 0.91 mIU/L (ref 0.40–4.50)

## 2022-05-03 ENCOUNTER — Encounter: Payer: Self-pay | Admitting: Family Medicine

## 2022-05-03 ENCOUNTER — Ambulatory Visit (INDEPENDENT_AMBULATORY_CARE_PROVIDER_SITE_OTHER): Payer: 59 | Admitting: Family Medicine

## 2022-05-03 VITALS — BP 127/66 | HR 83 | Ht 69.0 in | Wt 175.2 lb

## 2022-05-03 DIAGNOSIS — M7918 Myalgia, other site: Secondary | ICD-10-CM | POA: Diagnosis not present

## 2022-05-03 DIAGNOSIS — Z0000 Encounter for general adult medical examination without abnormal findings: Secondary | ICD-10-CM

## 2022-05-03 DIAGNOSIS — F331 Major depressive disorder, recurrent, moderate: Secondary | ICD-10-CM

## 2022-05-03 DIAGNOSIS — E785 Hyperlipidemia, unspecified: Secondary | ICD-10-CM

## 2022-05-03 DIAGNOSIS — G8929 Other chronic pain: Secondary | ICD-10-CM | POA: Diagnosis not present

## 2022-05-03 DIAGNOSIS — J449 Chronic obstructive pulmonary disease, unspecified: Secondary | ICD-10-CM | POA: Diagnosis not present

## 2022-05-03 DIAGNOSIS — M545 Low back pain, unspecified: Secondary | ICD-10-CM

## 2022-05-03 DIAGNOSIS — E1169 Type 2 diabetes mellitus with other specified complication: Secondary | ICD-10-CM

## 2022-05-03 DIAGNOSIS — I25118 Atherosclerotic heart disease of native coronary artery with other forms of angina pectoris: Secondary | ICD-10-CM | POA: Diagnosis not present

## 2022-05-03 DIAGNOSIS — R69 Illness, unspecified: Secondary | ICD-10-CM | POA: Diagnosis not present

## 2022-05-03 DIAGNOSIS — G894 Chronic pain syndrome: Secondary | ICD-10-CM | POA: Diagnosis not present

## 2022-05-03 NOTE — Assessment & Plan Note (Signed)
Persistent depression Secondary anxiety, insomnia Failed Wellbutrin, Fluoxetine. Off Xanax  On duloxetine '60mg'$  Trazodone '100mg'$ 

## 2022-05-03 NOTE — Assessment & Plan Note (Signed)
A1c elevated to >10 now, poor adherence to lifestyle Complications - Hyperlipidemia and CAD, Retinopathy Strong fam history of DIABETES Not on GLP1  Encouraged by improved CBG  Plan:  1. Dose increase Metformin XR '750mg'$  x 2 = '1500mg'$  daily in AM 2. Continue ASA, ACEi, Statin  Future may reduce metformin to one daily if remains in low 6 range

## 2022-05-03 NOTE — Progress Notes (Signed)
Subjective:    Patient ID: Clifford Morse, male    DOB: 1961/04/02, 61 y.o.   MRN: [id number]  Clifford Morse is a 61 y.o. male presenting on 05/03/2022 for Annual Exam   HPI  Here for Annual Physical and Lab Review.  Chronic pain syndrome Chronic Myofascial pain R Hip Pain Low Back Pain   Never seen pain specialist for this. He has had pain for years Admits all joints and all muscles hurting   Tried Gabapentin 650m nightly, Flexeril 122mTID PRN, Duloxetine 6017m Chronic problem with R hip pain   Chronic Pain Syndrome Chronic Hip / Knee pain Reports he has had chronic pain for long time. He has had worsening difficulty adapting to generalized pain, especially in both hips and knees.   He takes occasional OTC Ibuprofen, not consecutive days   Asking about further testing and management. Fam history of rheumatological condition or advanced arthritis Father Prior labs negative except RF Interested in PhyHowardN: Reports home BP normal range 120s Current Meds - Carvedilol 51m35mD, Lisinopril-HCTZ 10-12.5mg 17meports good compliance, took meds today. Tolerating well, w/o complaints. Denies CP, dyspnea, HA, edema, dizziness / lightheadedness   DM, Type 2 / Possible neuropathy complication Previously A1c 6D1VNow A1c up to 10.7, he attributes this to multiple stressors and poor diet, inc sugar, sodas and higher sugar foods. He is doing well currently. CBGs: AM Fasting sugar avg < 150. No symptoms of hypoglycemia Meds: Metformin XR 750mg 29my in AM Currently on ACEi already Lifestyle: - Diet (still improving low carb options) [facility] History of occasional foot stinging symptoms Denies hypoglycemia, polyuria, visual changes, numbness or tingling.   HYPERLIPIDEMIA: - Reports concerns. Last lipid panel with elevated TG, cardiology raised his Statin therapy from 20 to 40mg d55mRosuvastatin - Currently taking Rosuvastatin 40mg,  21mrating well without side effects or myalgias  Chronic Pain Syndrome Chronic Hip / Knee pain     CAD S/p Left Heart Cath by Dr Arida 12Fletcher Anon1 He had another cardiac stent placed   Centrilobular Emphysema / COPD [facility] GonzalezPatsey Bertholdsly on Trelegy and then switched to trial on Breztri and did not gain benefit Lowest cost on Trelegy was $200 with financial assistance, he could not afford this     Health Maintenance:   PSA 0.70 negative.  Declines Vaccines     05/03/2022    8:55 AM 11/30/2021   11:41 AM 10/26/2021    9:53 AM  Depression screen PHQ 2/9  Decreased Interest _0 Down, Depressed, Hopeless _1 PHQ - 2 Score _2 Altered sleeping _3 Tired, decreased energy _4 Change in appetite _5 Feeling bad or failure about yourself  1 0 0  Trouble concentrating _6 Moving slowly or fidgety/restless 0 1 0  Suicidal thoughts 1 0 1  PHQ-9 Score _7 Difficult doing work/chores Very difficult Extremely dIfficult Somewhat difficult    Past Medical History:  Diagnosis Date   Allergy    Arthritis    Asthma    Chest pain on exertion    Diabetes mellitus, type 2 (HCC)    GERD (gastroesophageal reflux disease)    Hyperlipidemia    Vertigo    with sinus issues   Past Surgical History:  Procedure Laterality Date   CARDIAC CATHETERIZATION  2014   stent   CORONARY STENT INTERVENTION  2014   CORONARY STENT INTERVENTION N/A 06/19/2020   Procedure: CORONARY STENT INTERVENTION;  Surgeon: Wellington Hampshire, MD;  Location: [place] [facility];  Service: Cardiovascular;  Laterality: N/A;   ESOPHAGOGASTRODUODENOSCOPY     HERNIA REPAIR     LEFT HEART CATH AND CORONARY ANGIOGRAPHY Left 06/19/2020   Procedure: LEFT HEART CATH AND CORONARY ANGIOGRAPHY;  Surgeon: Wellington Hampshire, MD;  Location: [place] [facility];  Service: Cardiovascular;  Laterality: Left;   Social History   Socioeconomic History   Marital status:  Married    Spouse name: Marcie Bal    Number of children: 2   Years of education: Not on file   Highest education level: Not on file  Occupational History   Not on file  Tobacco Use   Smoking status: Every Day    Packs/day: 2.00    Years: 40.00    Total pack years: 80.00    Types: Cigarettes   Smokeless tobacco: Former   Tobacco comments:    1PPD   Vaping Use   Vaping Use: Never used  Substance and Sexual Activity   Alcohol use: Yes    Alcohol/week: 0.0 standard drinks of alcohol    Comment: rare   Drug use: No   Sexual activity: Not on file  Other Topics Concern   Not on file  Social History Narrative   Lives at home with wife   Social Determinants of Health   Financial Resource Strain: Not on file  Food Insecurity: No Food Insecurity (04/03/2022)   Hunger Vital Sign    Worried About Running Out of Food in the Last Year: Never true    Ran Out of Food in the Last Year: Never true  Transportation Needs: No Transportation Needs (04/03/2022)   PRAPARE - Hydrologist (Medical): No    Lack of Transportation (Non-Medical): No  Physical Activity: Not on file  Stress: Not on file  Social Connections: Not on file  Intimate Partner Violence: Not on file   Family History  Problem Relation Age of Onset   Emphysema Father    Diabetes Mother    Current Outpatient Medications on File Prior to Visit  Medication Sig   albuterol (VENTOLIN HFA) 108 (90 Base) MCG/ACT inhaler Inhale 2 puffs into the lungs every 6 (six) hours as needed for wheezing or shortness of breath.   aspirin 81 MG tablet Take 81 mg by mouth daily.   carvedilol (COREG) 25 MG tablet Take 1 tablet (25 mg total) by mouth 2 (two) times daily with a meal.   clopidogrel (PLAVIX) 75 MG tablet Take 1 tablet (75 mg total) by mouth daily.   cyclobenzaprine (FLEXERIL) 10 MG tablet TAKE ONE-HALF TO ONE TABLET BY MOUTH THREE TIMES DAILY AS NEEDED FOR MUSCLE SPASM   DULoxetine (CYMBALTA) 60 MG capsule  Take 1 capsule by mouth once daily   fenofibrate 160 MG tablet Take 1 tablet (160 mg total) by mouth daily.   fluticasone (FLONASE) 50 MCG/ACT nasal spray Place 1 spray into both nostrils daily as needed for allergies or rhinitis.   gabapentin (NEURONTIN) 600 MG tablet TAKE 1 TABLET BY MOUTH AT BEDTIME   lisinopril-hydrochlorothiazide (ZESTORETIC) 10-12.5 MG tablet Take 1 tablet by mouth once daily   pantoprazole (PROTONIX) 40 MG tablet TAKE 1 TABLET BY MOUTH ONCE DAILY BEFORE BREAKFAST   pseudoephedrine-acetaminophen (TYLENOL SINUS) 30-500 MG TABS tablet Take 2 tablets by mouth 2 (  two) times daily as needed (congestion).   rosuvastatin (CRESTOR) 40 MG tablet Take 1 tablet (40 mg total) by mouth daily.   traZODone (DESYREL) 100 MG tablet Take 1 tablet (100 mg total) by mouth at bedtime.   icosapent Ethyl (VASCEPA) 1 g capsule Take 2 capsules (2 g total) by mouth 2 (two) times daily for 180 doses.   metFORMIN (GLUCOPHAGE-XR) 750 MG 24 hr tablet Take 1 tablet (750 mg total) by mouth 2 (two) times daily with a meal.   No current facility-administered medications on file prior to visit.    Review of Systems  Constitutional:  Negative for activity change, appetite change, chills, diaphoresis, fatigue and fever.  HENT:  Negative for congestion and hearing loss.   Eyes:  Negative for visual disturbance.  Respiratory:  Negative for cough, chest tightness, shortness of breath and wheezing.   Cardiovascular:  Negative for chest pain, palpitations and leg swelling.  Gastrointestinal:  Negative for abdominal pain, constipation, diarrhea, nausea and vomiting.  Genitourinary:  Negative for dysuria, frequency and hematuria.  Musculoskeletal:  Positive for arthralgias and back pain. Negative for neck pain.  Skin:  Negative for rash.  Neurological:  Negative for dizziness, weakness, light-headedness, numbness and headaches.  Hematological:  Negative for adenopathy.  Psychiatric/Behavioral:  Positive for  dysphoric mood. Negative for behavioral problems and sleep disturbance.    Per HPI unless specifically indicated above      Objective:    BP 127/66   Pulse 83   Ht _0  (1.753 m)   Wt 175 lb 3.2 oz (79.5 kg)   SpO2 99%   BMI 25.87 kg/m   Wt Readings from Last 3 Encounters:  05/03/22 175 lb 3.2 oz (79.5 kg)  03/25/22 178 lb 9.6 oz (81 kg)  12/13/21 185 lb (83.9 kg)    Physical Exam Vitals and nursing note reviewed.  Constitutional:      General: He is not in acute distress.    Appearance: He is well-developed. He is not diaphoretic.     Comments: Well-appearing, comfortable, cooperative  HENT:     Head: Normocephalic and atraumatic.  Eyes:     General:        Right eye: No discharge.        Left eye: No discharge.     Conjunctiva/sclera: Conjunctivae normal.     Pupils: Pupils are equal, round, and reactive to light.  Neck:     Thyroid: No thyromegaly.  Cardiovascular:     Rate and Rhythm: Normal rate and regular rhythm.     Pulses: Normal pulses.     Heart sounds: Normal heart sounds. No murmur heard. Pulmonary:     Effort: Pulmonary effort is normal. No respiratory distress.     Breath sounds: Normal breath sounds. No wheezing or rales.  Abdominal:     General: Bowel sounds are normal. There is no distension.     Palpations: Abdomen is soft. There is no mass.     Tenderness: There is no abdominal tenderness.  Musculoskeletal:        General: No tenderness. Normal range of motion.     Cervical back: Normal range of motion and neck supple.     Comments: Upper / Lower Extremities: - Normal muscle tone, strength bilateral upper extremities 5/5, lower extremities 5/5  Lymphadenopathy:     Cervical: No cervical adenopathy.  Skin:    General: Skin is warm and dry.     Findings: No erythema or rash.  Neurological:  Mental Status: He is alert and oriented to person, place, and time.     Comments: Distal sensation intact to light touch all extremities   Psychiatric:        Mood and Affect: Mood normal.        Behavior: Behavior normal.        Thought Content: Thought content normal.     Comments: Well groomed, good eye contact, normal speech and thoughts     Diabetic Foot Exam - Simple   Simple Foot Form Diabetic Foot exam was performed with the following findings: Yes 05/03/2022  9:32 AM  Visual Inspection No deformities, no ulcerations, no other skin breakdown bilaterally: Yes Sensation Testing See comments: Yes Pulse Check Posterior Tibialis and Dorsalis pulse intact bilaterally: Yes Comments Mild reduced monofilament sensation      Results for orders placed or performed in visit on 04/25/22  TSH  Result Value Ref Range   TSH 0.91 0.40 - 4.50 mIU/L  PSA  Result Value Ref Range   PSA 0.70 < OR = 4.00 ng/mL  Hemoglobin A1c  Result Value Ref Range   Hgb A1c MFr Bld 10.7 (H) <5.7 % of total Hgb   Mean Plasma Glucose 260 mg/dL   eAG (mmol/L) 14.4 mmol/L  Lipid panel  Result Value Ref Range   Cholesterol 100 <200 mg/dL   HDL 31 (L) > OR = 40 mg/dL   Triglycerides 204 (H) <150 mg/dL   LDL Cholesterol (Calc) 42 mg/dL (calc)   Total CHOL/HDL Ratio 3.2 <5.0 (calc)   Non-HDL Cholesterol (Calc) 69 <130 mg/dL (calc)  CBC with Differential/Platelet  Result Value Ref Range   WBC 8.2 3.8 - 10.8 Thousand/uL   RBC 4.71 4.20 - 5.80 Million/uL   Hemoglobin 14.9 13.2 - 17.1 g/dL   HCT 42.5 38.5 - 50.0 %   MCV 90.2 80.0 - 100.0 fL   MCH 31.6 27.0 - 33.0 pg   MCHC 35.1 32.0 - 36.0 g/dL   RDW 11.6 11.0 - 15.0 %   Platelets 267 140 - 400 Thousand/uL   MPV 9.1 7.5 - 12.5 fL   Neutro Abs 4,453 1,500 - 7,800 cells/uL   Lymphs Abs 2,911 850 - 3,900 cells/uL   Absolute Monocytes 484 200 - 950 cells/uL   Eosinophils Absolute 279 15 - 500 cells/uL   Basophils Absolute 74 0 - 200 cells/uL   Neutrophils Relative % 54.3 %   Total Lymphocyte 35.5 %   Monocytes Relative 5.9 %   Eosinophils Relative 3.4 %   Basophils Relative 0.9 %   COMPLETE METABOLIC PANEL WITH GFR  Result Value Ref Range   Glucose, Bld 231 (H) 65 - 99 mg/dL   BUN 11 7 - 25 mg/dL   Creat 0.81 0.70 - 1.35 mg/dL   eGFR 100 > OR = 60 mL/min/1.9m   BUN/Creatinine Ratio SEE NOTE: 6 - 22 (calc)   Sodium 134 (L) 135 - 146 mmol/L   Potassium 4.3 3.5 - 5.3 mmol/L   Chloride 99 98 - 110 mmol/L   CO2 26 20 - 32 mmol/L   Calcium 9.2 8.6 - 10.3 mg/dL   Total Protein 7.0 6.1 - 8.1 g/dL   Albumin 4.3 3.6 - 5.1 g/dL   Globulin 2.7 1.9 - 3.7 g/dL (calc)   AG Ratio 1.6 1.0 - 2.5 (calc)   Total Bilirubin 0.6 0.2 - 1.2 mg/dL   Alkaline phosphatase (APISO) 71 35 - 144 U/L   AST 41 (H) 10 - 35 U/L  ALT 33 9 - 46 U/L      Assessment & Plan:   Problem List Items Addressed This Visit     Chronic myofascial pain   Relevant Orders   Ambulatory referral to Physical Medicine Rehab   Chronic pain syndrome   Relevant Orders   Ambulatory referral to Physical Medicine Rehab   Coronary artery disease of native artery of native heart with stable angina pectoris ([place])   Hyperlipidemia associated with type 2 diabetes mellitus ([place])    TG >200 LDL Controlled ASCVD with known CAD  Plan: 1. Continue Rosuvastatin 2. Continue DAPT Plavix + ASA 14m for secondary ASCVD risk reduction 3. Encourage improved lifestyle - low carb/cholesterol, reduce portion size, continue improving regular exercise      Relevant Medications   metFORMIN (GLUCOPHAGE-XR) 750 MG 24 hr tablet   Major depressive disorder, recurrent, moderate (HCC)    Persistent depression Secondary anxiety, insomnia Failed Wellbutrin, Fluoxetine. Off Xanax  On duloxetine 629mTrazodone 10057m    Stage 1 mild COPD by GOLD classification (HCCSardis  Without flare Previously followed by Pulm Off inhaler for maintenance      Type 2 diabetes mellitus with other specified complication (HCC)    A1cG6Vevated to >10 now, poor adherence to lifestyle Complications - Hyperlipidemia and CAD,  Retinopathy Strong fam history of DIABETES Not on GLP1  Encouraged by improved CBG  Plan:  1. Dose increase Metformin XR 750m72m2 = 1500mg2mly in AM 2. Continue ASA, ACEi, Statin  Future may reduce metformin to one daily if remains in low 6 range      Relevant Medications   metFORMIN (GLUCOPHAGE-XR) 750 MG 24 hr tablet   Other Relevant Orders   Microalbumin, urine   Other Visit Diagnoses     Annual physical exam    -  Primary   Chronic bilateral low back pain without sciatica       Relevant Orders   Ambulatory referral to Physical Medicine Rehab       Updated Health Maintenance information Reviewed recent lab results with patient Encouraged improvement to lifestyle with diet and exercise Goal of weight loss  Referral to Dr MoralAlba Destineiatry Unable to arrange Rheumatology referral previously  referral to PhysiDorchesterchronic back and joint pain [facility]. chronic myofascial pain, multiple muscles joints generalized, has known DDD / DJD lumbar spine, recent X-ray confirms, he has been on variety of alternative med options duloxetine, gabapentin, muscle relaxants, lab work up for rheumatology serology done, showed all negative except Rheumatoid Factor 36, rest was negative including ANA, CRP ESR Anti CCP. Considered Rheumatology but now requesting non rheumatological consult. With Physiatry   No orders of the defined types were placed in this encounter.    Follow up plan: Return in about 3 months (around 08/03/2022) for 3 month follow-up DM A1c, updates Physiatry.  [facility]aNobie [facility] 05/03/2022, 9:29 AM

## 2022-05-03 NOTE — Patient Instructions (Addendum)
Thank you for coming to the office today.  Referral  [facility]  Randa Evens, MD Physiatrist in [place], [place] Address: [address] [place], [place], [place] [address] Phone: [phone]  Recent Labs    04/26/22 0812  HGBA1C 10.7*   Double the Metformin XR '750mg'$  can take TWO at once with meal OR if you prefer, one twice a day with meals  Keep on the Crestor  All other labs look good, if we fix the blood sugar, that will fix the triglyceride  I recommend return for Colonoscopy in 2024. Please let me know by phone or message when you are ready for me to place the order.  Also if you need help with medication, I can refer you to our "Chronic Care Management Team" with pharmacist and nurse who can help get access to medications by submitting application.   Diet Recommendations for Diabetes   Starchy (carb) foods include: Bread, rice, pasta, potatoes, corn, crackers, bagels, muffins, all baked goods.   FRUITS - AVOID / LIMIT these HIGH sugar/carb fruits = Pineapple, Watermelon, Bananas - OKAY to EAT with these MEDIUM sugar/carb fruits = Citrus, Oranges, Grapes - PREFER to EAT these LOW sugar/carb fruits = Apples, Berries, Pears, Plums  Protein foods include: Meat, fish, poultry, eggs, dairy foods, and beans such as pinto and kidney beans (beans also provide carbohydrate).   1. Eat at least 3 meals and 1-2 snacks per day. Never go more than 4-5 hours while awake without eating.   2. Limit starchy foods to TWO per meal and ONE per snack. ONE portion of a starchy  food is equal to the following:   - ONE slice of bread (or its equivalent, such as half of a hamburger bun).   - 1/2 cup of a "scoopable" starchy food such as potatoes or rice.   - 1 OUNCE (28 grams) of starchy snacks (crackers or pretzels, look on label).   - 15 grams of carbohydrate as shown on food label.   3. Both lunch and dinner should include a protein food, a carb food, and vegetables.   -  Obtain twice as many veg's as protein or carbohydrate foods for both lunch and dinner.   - Try to keep frozen veg's on hand for a quick vegetable serving.     - Fresh or frozen veg's are best.   4. Breakfast should always include protein.      Please schedule a Follow-up Appointment to: Return in about 3 months (around 08/03/2022) for 3 month follow-up DM A1c, updates Physiatry.  If you have any other questions or concerns, please feel free to call the office or send a message through [place]. You may also schedule an earlier appointment if necessary.  Additionally, you may be receiving a survey about your experience at our office within a few days to 1 week by e-mail or mail. We value your feedback.  Nobie Putnam, DO [place]

## 2022-05-03 NOTE — Assessment & Plan Note (Signed)
Without flare Previously followed by Pulm Off inhaler for maintenance

## 2022-05-03 NOTE — Assessment & Plan Note (Signed)
TG >200 LDL Controlled ASCVD with known CAD  Plan: 1. Continue Rosuvastatin 2. Continue DAPT Plavix + ASA '81mg'$  for secondary ASCVD risk reduction 3. Encourage improved lifestyle - low carb/cholesterol, reduce portion size, continue improving regular exercise

## 2022-05-04 LAB — MICROALBUMIN, URINE: Microalb, Ur: 0.3 mg/dL

## 2022-05-14 ENCOUNTER — Other Ambulatory Visit: Payer: Self-pay | Admitting: Family Medicine

## 2022-05-14 DIAGNOSIS — F331 Major depressive disorder, recurrent, moderate: Secondary | ICD-10-CM

## 2022-05-14 DIAGNOSIS — F5104 Psychophysiologic insomnia: Secondary | ICD-10-CM

## 2022-05-14 NOTE — Telephone Encounter (Signed)
Requested Prescriptions  Pending Prescriptions Disp Refills   traZODone (DESYREL) 100 MG tablet [Pharmacy Med Name: traZODone HCl 100 MG Oral Tablet] 90 tablet 1    Sig: TAKE 1 TABLET BY MOUTH AT BEDTIME     Psychiatry: Antidepressants - Serotonin Modulator Passed - 05/14/2022  6:27 AM      Passed - Completed PHQ-2 or PHQ-9 in the last 360 days      Passed - Valid encounter within last 6 months    Recent Outpatient Visits           1 week ago Annual physical exam   [place], DO   5 months ago Chronic myofascial pain   [place], DO   6 months ago Type 2 diabetes mellitus with other specified complication, without long-term current use of insulin [facility])   [place], DO   1 year ago Annual physical exam   [facility] Olin Hauser, DO   1 year ago Type 2 diabetes mellitus with other specified complication, without long-term current use of insulin ([place])   [place], Devonne Doughty, DO       Future Appointments             In 4 months Agbor-Etang, Aaron Edelman, MD [place]. [place]

## 2022-05-15 ENCOUNTER — Telehealth: Payer: Self-pay | Admitting: Family Medicine

## 2022-05-15 ENCOUNTER — Other Ambulatory Visit: Payer: Self-pay

## 2022-05-15 DIAGNOSIS — E1169 Type 2 diabetes mellitus with other specified complication: Secondary | ICD-10-CM

## 2022-05-15 MED ORDER — METFORMIN HCL ER 750 MG PO TB24: 750.0000 mg | ORAL_TABLET | Freq: Two times a day (BID) | ORAL | 1 refills | Status: DC

## 2022-05-15 NOTE — Telephone Encounter (Signed)
metFORMIN (GLUCOPHAGE-XR) 750 MG 24 hr tablet 180 tablet 1 05/03/2022    Sig - Route: Take 1 tablet (750 mg total) by mouth 2 (two) times daily with a meal. - Oral   Class: Historical Med     This med was written on 11/3 and did not get sent. Please to  [place] ([address] Phone: [phone]  Fax: [phone]

## 2022-05-15 NOTE — Telephone Encounter (Signed)
Resent to [facility] [employer] on [address].

## 2022-05-20 ENCOUNTER — Other Ambulatory Visit: Payer: Self-pay | Admitting: Physical Medicine & Rehabilitation

## 2022-05-20 DIAGNOSIS — G8929 Other chronic pain: Secondary | ICD-10-CM | POA: Diagnosis not present

## 2022-05-20 DIAGNOSIS — M5441 Lumbago with sciatica, right side: Secondary | ICD-10-CM | POA: Diagnosis not present

## 2022-05-30 ENCOUNTER — Ambulatory Visit
Admission: RE | Admit: 2022-05-30 | Discharge: 2022-05-30 | Disposition: A | Discharge status: Home / Self Care | Payer: 59 | Source: Ambulatory Visit | Attending: Physical Medicine & Rehabilitation | Admitting: Physical Medicine & Rehabilitation

## 2022-05-30 DIAGNOSIS — G8929 Other chronic pain: Secondary | ICD-10-CM | POA: Insufficient documentation

## 2022-05-30 DIAGNOSIS — M5441 Lumbago with sciatica, right side: Secondary | ICD-10-CM | POA: Insufficient documentation

## 2022-05-30 DIAGNOSIS — M5136 Other intervertebral disc degeneration, lumbar region: Secondary | ICD-10-CM | POA: Diagnosis not present

## 2022-06-01 ENCOUNTER — Other Ambulatory Visit: Payer: Self-pay | Admitting: Physician Assistant

## 2022-06-05 DIAGNOSIS — G8929 Other chronic pain: Secondary | ICD-10-CM | POA: Diagnosis not present

## 2022-06-05 DIAGNOSIS — M5442 Lumbago with sciatica, left side: Secondary | ICD-10-CM | POA: Diagnosis not present

## 2022-06-05 DIAGNOSIS — M48062 Spinal stenosis, lumbar region with neurogenic claudication: Secondary | ICD-10-CM | POA: Diagnosis not present

## 2022-06-05 DIAGNOSIS — M5441 Lumbago with sciatica, right side: Secondary | ICD-10-CM | POA: Diagnosis not present

## 2022-06-07 ENCOUNTER — Other Ambulatory Visit: Payer: Self-pay | Admitting: Family Medicine

## 2022-06-07 DIAGNOSIS — I10 Essential (primary) hypertension: Secondary | ICD-10-CM

## 2022-06-07 DIAGNOSIS — I2510 Atherosclerotic heart disease of native coronary artery without angina pectoris: Secondary | ICD-10-CM

## 2022-06-07 NOTE — Telephone Encounter (Signed)
Requested Prescriptions  Pending Prescriptions Disp Refills   carvedilol (COREG) 25 MG tablet [Pharmacy Med Name: Carvedilol 25 MG Oral Tablet] 180 tablet 0    Sig: TAKE 1 TABLET BY MOUTH TWICE DAILY WITH A MEAL     Cardiovascular: Beta Blockers 3 Failed - 06/07/2022  6:14 AM      Failed - AST in normal range and within 360 days    AST  Date Value Ref Range Status  04/26/2022 41 (H) 10 - 35 U/L Final   SGOT(AST)  Date Value Ref Range Status  02/27/2013 40 (H) 15 - 37 Unit/L Final         Passed - Cr in normal range and within 360 days    Creat  Date Value Ref Range Status  04/26/2022 0.81 0.70 - 1.35 mg/dL Final         Passed - ALT in normal range and within 360 days    ALT  Date Value Ref Range Status  04/26/2022 33 9 - 46 U/L Final   SGPT (ALT)  Date Value Ref Range Status  02/27/2013 73 12 - 78 U/L Final         Passed - Last BP in normal range    BP Readings from Last 1 Encounters:  05/03/22 127/66         Passed - Last Heart Rate in normal range    Pulse Readings from Last 1 Encounters:  05/03/22 83         Passed - Valid encounter within last 6 months    Recent Outpatient Visits           1 month ago Annual physical exam   [facility], Devonne Doughty, DO   6 months ago Chronic myofascial pain   [place], DO   7 months ago Type 2 diabetes mellitus with other specified complication, without long-term current use of insulin [facility])   [place], DO   1 year ago Annual physical exam   [place], DO   1 year ago Type 2 diabetes mellitus with other specified complication, without long-term current use of insulin ([place])   [place], Devonne Doughty, DO       Future Appointments             In 3 months Agbor-Etang, Aaron Edelman, MD [place]. [place]

## 2022-06-11 ENCOUNTER — Other Ambulatory Visit: Payer: Self-pay | Admitting: Cardiology

## 2022-06-11 MED ORDER — CLOPIDOGREL BISULFATE 75 MG PO TABS: 75.0000 mg | ORAL_TABLET | Freq: Every day | ORAL | 2 refills | Status: DC

## 2022-06-12 ENCOUNTER — Other Ambulatory Visit: Payer: Self-pay | Admitting: Cardiology

## 2022-06-12 MED ORDER — FENOFIBRATE 160 MG PO TABS: 160.0000 mg | ORAL_TABLET | Freq: Every day | ORAL | 3 refills | Status: DC

## 2022-06-14 ENCOUNTER — Ambulatory Visit: Payer: 59 | Admitting: Nurse Practitioner

## 2022-06-26 DIAGNOSIS — M5442 Lumbago with sciatica, left side: Secondary | ICD-10-CM | POA: Diagnosis not present

## 2022-06-26 DIAGNOSIS — M5441 Lumbago with sciatica, right side: Secondary | ICD-10-CM | POA: Diagnosis not present

## 2022-06-26 DIAGNOSIS — M48062 Spinal stenosis, lumbar region with neurogenic claudication: Secondary | ICD-10-CM | POA: Diagnosis not present

## 2022-06-26 DIAGNOSIS — G8929 Other chronic pain: Secondary | ICD-10-CM | POA: Diagnosis not present

## 2022-07-03 ENCOUNTER — Other Ambulatory Visit: Payer: Self-pay | Admitting: Family Medicine

## 2022-07-03 DIAGNOSIS — K219 Gastro-esophageal reflux disease without esophagitis: Secondary | ICD-10-CM

## 2022-07-04 NOTE — Telephone Encounter (Signed)
Requested Prescriptions  Pending Prescriptions Disp Refills   pantoprazole (PROTONIX) 40 MG tablet [Pharmacy Med Name: Pantoprazole Sodium 40 MG Oral Tablet Delayed Release] 90 tablet 0    Sig: TAKE 1 TABLET BY MOUTH ONCE DAILY BEFORE BREAKFAST     Gastroenterology: Proton Pump Inhibitors Passed - 07/03/2022  5:28 PM      Passed - Valid encounter within last 12 months    Recent Outpatient Visits           2 months ago Annual physical exam   [facility] Olin Hauser, DO   7 months ago Chronic myofascial pain   [place], DO   8 months ago Type 2 diabetes mellitus with other specified complication, without long-term current use of insulin [facility])   [place], DO   1 year ago Annual physical exam   [facility] Olin Hauser, DO   1 year ago Type 2 diabetes mellitus with other specified complication, without long-term current use of insulin ([place])   [facility] [place], Devonne Doughty, DO       Future Appointments             In 2 months Agbor-Etang, Aaron Edelman, MD [place]. [place]

## 2022-07-13 ENCOUNTER — Other Ambulatory Visit: Payer: Self-pay | Admitting: Family Medicine

## 2022-07-13 DIAGNOSIS — F331 Major depressive disorder, recurrent, moderate: Secondary | ICD-10-CM

## 2022-07-15 NOTE — Telephone Encounter (Signed)
Requested Prescriptions  Pending Prescriptions Disp Refills   DULoxetine (CYMBALTA) 60 MG capsule [Pharmacy Med Name: DULoxetine HCl 60 MG Oral Capsule Delayed Release Particles] 90 capsule 0    Sig: Take 1 capsule by mouth once daily     Psychiatry: Antidepressants - SNRI - duloxetine Passed - 07/13/2022  6:50 AM      Passed - Cr in normal range and within 360 days    Creat  Date Value Ref Range Status  04/26/2022 0.81 0.70 - 1.35 mg/dL Final         Passed - eGFR is 30 or above and within 360 days    GFR, Est African American  Date Value Ref Range Status  04/18/2020 113 > OR = 60 mL/min/1.88m Final   GFR calc Af Amer  Date Value Ref Range Status  05/22/2020 118 >59 mL/min/1.73 Final    Comment:    **In accordance with recommendations from the NKF-ASN Task force,**   [facility] is in the process of updating its eGFR calculation to the   2021 CKD-EPI creatinine equation that estimates kidney function   without a race variable.    GFR, Est Non African American  Date Value Ref Range Status  04/18/2020 97 > OR = 60 mL/min/1.726mFinal   GFR, Estimated  Date Value Ref Range Status  06/20/2020 >60 >60 mL/min Final    Comment:    (NOTE) Calculated using the CKD-EPI Creatinine Equation (2021)    eGFR  Date Value Ref Range Status  04/26/2022 100 > OR = 60 mL/min/1.7361minal         Passed - Completed PHQ-2 or PHQ-9 in the last 360 days      Passed - Last BP in normal range    BP Readings from Last 1 Encounters:  05/03/22 127/66         Passed - Valid encounter within last 6 months    Recent Outpatient Visits           2 months [facility]   7 months ago Chronic myofascial pain   [place]   8 months ago Type 2 diabetes mellitus with other specified complication, without long-term current use of insulin [facility] [place]   1 year ago Annual physical exam   [facility]   1 year ago Type 2 diabetes mellitus with other specified complication, without long-term current use of insulin ([facility] [facility] [place] [facility]       Future Appointments             In 2 months Agbor-Etang, [place]aron EdelmanD [place]

## 2022-07-16 DIAGNOSIS — M5442 Lumbago with sciatica, left side: Secondary | ICD-10-CM | POA: Diagnosis not present

## 2022-07-16 DIAGNOSIS — M48062 Spinal stenosis, lumbar region with neurogenic claudication: Secondary | ICD-10-CM | POA: Diagnosis not present

## 2022-07-16 DIAGNOSIS — G8929 Other chronic pain: Secondary | ICD-10-CM | POA: Diagnosis not present

## 2022-07-16 DIAGNOSIS — M5441 Lumbago with sciatica, right side: Secondary | ICD-10-CM | POA: Diagnosis not present

## 2022-07-18 ENCOUNTER — Other Ambulatory Visit: Payer: Self-pay | Admitting: Family Medicine

## 2022-07-18 DIAGNOSIS — M62830 Muscle spasm of back: Secondary | ICD-10-CM

## 2022-07-18 NOTE — Telephone Encounter (Signed)
Requested medication (s) are due for refill today: yes  Requested medication (s) are on the active medication list: yes    Last refill: 01/22/22  #60  2 refills  Future visit scheduled no  Notes to clinic:Not delegated, please review. Thank you.  Requested Prescriptions  Pending Prescriptions Disp Refills   cyclobenzaprine (FLEXERIL) 10 MG tablet [Pharmacy Med Name: Cyclobenzaprine HCl 10 MG Oral Tablet] 60 tablet 0    Sig: TAKE ONE-HALF TO ONE TABLET BY MOUTH THREE TIMES DAILY AS NEEDED FOR MUSCLE SPASM     Not Delegated - Analgesics:  Muscle Relaxants Failed - 07/18/2022  6:36 AM      Failed - This refill cannot be delegated      Passed - Valid encounter within last 6 months    Recent Outpatient Visits           2 months ago Annual physical exam   [facility] Olin Hauser, DO   7 months ago Chronic myofascial pain   [place], DO   8 months ago Type 2 diabetes mellitus with other specified complication, without long-term current use of insulin [facility])   [place], DO   1 year ago Annual physical exam   [facility] Olin Hauser, DO   1 year ago Type 2 diabetes mellitus with other specified complication, without long-term current use of insulin ([place])   [facility] [place], Devonne Doughty, DO       Future Appointments             In 2 months Agbor-Etang, Aaron Edelman, MD [place]. [place]

## 2022-09-23 ENCOUNTER — Ambulatory Visit: Payer: 59 | Attending: Cardiology | Admitting: Cardiology

## 2022-09-24 ENCOUNTER — Encounter: Payer: Self-pay | Admitting: Cardiology

## 2022-09-26 ENCOUNTER — Ambulatory Visit: Payer: 59 | Attending: Cardiology | Admitting: Cardiology

## 2022-09-26 ENCOUNTER — Encounter: Payer: Self-pay | Admitting: Cardiology

## 2022-09-26 VITALS — BP 130/76 | HR 82 | Ht 69.0 in | Wt 178.0 lb

## 2022-09-26 DIAGNOSIS — I10 Essential (primary) hypertension: Secondary | ICD-10-CM | POA: Diagnosis not present

## 2022-09-26 DIAGNOSIS — F17200 Nicotine dependence, unspecified, uncomplicated: Secondary | ICD-10-CM

## 2022-09-26 DIAGNOSIS — E782 Mixed hyperlipidemia: Secondary | ICD-10-CM | POA: Diagnosis not present

## 2022-09-26 DIAGNOSIS — I2510 Atherosclerotic heart disease of native coronary artery without angina pectoris: Secondary | ICD-10-CM

## 2022-09-26 MED ORDER — ROSUVASTATIN CALCIUM 40 MG PO TABS: 40.0000 mg | ORAL_TABLET | Freq: Every day | ORAL | 3 refills | Status: DC

## 2022-09-26 MED ORDER — RANOLAZINE ER 500 MG PO TB12: 500.0000 mg | ORAL_TABLET | Freq: Two times a day (BID) | ORAL | 0 refills | Status: DC

## 2022-09-26 NOTE — Progress Notes (Signed)
Cardiology Office Note:    Date:  09/26/2022   ID:  Clifford Morse, DOB August 01, 1960, MRN [id number]  PCP:  Olin Hauser, DO  [place] Cardiologist:  Kate Sable, MD  [place] Electrophysiologist:  None   Referring MD: Nobie Putnam *   Chief Complaint  Patient presents with   Follow-up    6 month f/u.  Occasional SOBr and chest pain with activity.       History of Present Illness:    Clifford Morse is a 62 y.o. male with a hx of MI/CAD (s/p DES to mid LAD 03/2013, recent mid to distal LAD stent 06/19/2020), hypertension, hyperlipidemia, current smoker x35+ years, COPD who presents for follow-up.   He still smokes, has occasional chest pain when he overexerts himself.  Recent Myoview showed no significant ischemia.  He is compliant medications as prescribed.  Feels okay otherwise.  Considering Viagra/Cialis use in the near future.  Prior notes Leane Call 10/2021 no significant ischemia. Had an MI in 2014, drug-eluting stent was placed to the LAD. Left heart cath 06/19/2020 patent mid LAD stent, mid to distal LAD 80% disease status post DES. Mild left circumflex and RCA disease,  Echo 05/24/2020 showed normal systolic and diastolic function, EF 60 to 65%  Past Medical History:  Diagnosis Date   Allergy    Arthritis    Asthma    Chest pain on exertion    Diabetes mellitus, type 2 (HCC)    GERD (gastroesophageal reflux disease)    Hyperlipidemia    Vertigo    with sinus issues    Past Surgical History:  Procedure Laterality Date   CARDIAC CATHETERIZATION  2014   stent   CORONARY STENT INTERVENTION  2014   CORONARY STENT INTERVENTION N/A 06/19/2020   Procedure: CORONARY STENT INTERVENTION;  Surgeon: Wellington Hampshire, MD;  Location: [place] [facility];  Service: Cardiovascular;  Laterality: N/A;   ESOPHAGOGASTRODUODENOSCOPY     HERNIA REPAIR     LEFT HEART CATH AND CORONARY ANGIOGRAPHY Left 06/19/2020   Procedure: LEFT HEART  CATH AND CORONARY ANGIOGRAPHY;  Surgeon: Wellington Hampshire, MD;  Location: [place] [facility];  Service: Cardiovascular;  Laterality: Left;    Current Medications: Current Meds  Medication Sig   albuterol (VENTOLIN HFA) 108 (90 Base) MCG/ACT inhaler Inhale 2 puffs into the lungs every 6 (six) hours as needed for wheezing or shortness of breath.   aspirin 81 MG tablet Take 81 mg by mouth daily.   carvedilol (COREG) 25 MG tablet TAKE 1 TABLET BY MOUTH TWICE DAILY WITH A MEAL   clopidogrel (PLAVIX) 75 MG tablet Take 1 tablet (75 mg total) by mouth daily.   cyclobenzaprine (FLEXERIL) 10 MG tablet TAKE ONE-HALF TO ONE TABLET BY MOUTH THREE TIMES DAILY AS NEEDED FOR MUSCLE SPASM   DULoxetine (CYMBALTA) 60 MG capsule Take 1 capsule by mouth once daily   fenofibrate 160 MG tablet Take 1 tablet (160 mg total) by mouth daily.   fluticasone (FLONASE) 50 MCG/ACT nasal spray Place 1 spray into both nostrils daily as needed for allergies or rhinitis.   gabapentin (NEURONTIN) 300 MG capsule Take 300 mg by mouth 2 (two) times daily.   gabapentin (NEURONTIN) 600 MG tablet TAKE 1 TABLET BY MOUTH AT BEDTIME   icosapent Ethyl (VASCEPA) 1 g capsule Take 2 capsules by mouth twice daily   lisinopril-hydrochlorothiazide (ZESTORETIC) 10-12.5 MG tablet Take 1 tablet by mouth once daily   metFORMIN (GLUCOPHAGE-XR) 750 MG 24  hr tablet Take 1 tablet (750 mg total) by mouth 2 (two) times daily with a meal.   pantoprazole (PROTONIX) 40 MG tablet TAKE 1 TABLET BY MOUTH ONCE DAILY BEFORE BREAKFAST   pseudoephedrine-acetaminophen (TYLENOL SINUS) 30-500 MG TABS tablet Take 2 tablets by mouth 2 (two) times daily as needed (congestion).   ranolazine (RANEXA) 500 MG 12 hr tablet Take 1 tablet (500 mg total) by mouth 2 (two) times daily.   traZODone (DESYREL) 100 MG tablet TAKE 1 TABLET BY MOUTH AT BEDTIME   [DISCONTINUED] rosuvastatin (CRESTOR) 40 MG tablet Take 1 tablet (40 mg total) by mouth daily.     Allergies:   Crestor  [rosuvastatin]   Social History   Socioeconomic History   Marital status: Married    Spouse name: Marcie Bal    Number of children: 2   Years of education: Not on file   Highest education level: Not on file  Occupational History   Not on file  Tobacco Use   Smoking status: Every Day    Packs/day: 2.00    Years: 40.00    Additional pack years: 0.00    Total pack years: 80.00    Types: Cigarettes   Smokeless tobacco: Former   Tobacco comments:    1PPD   Vaping Use   Vaping Use: Never used  Substance and Sexual Activity   Alcohol use: Yes    Alcohol/week: 0.0 standard drinks of alcohol    Comment: rare   Drug use: No   Sexual activity: Not on file  Other Topics Concern   Not on file  Social History Narrative   Lives at home with wife   Social Determinants of Health   Financial Resource Strain: Not on file  Food Insecurity: No Food Insecurity (04/03/2022)   Hunger Vital Sign    Worried About Running Out of Food in the Last Year: Never true    Ran Out of Food in the Last Year: Never true  Transportation Needs: No Transportation Needs (04/03/2022)   PRAPARE - Hydrologist (Medical): No    Lack of Transportation (Non-Medical): No  Physical Activity: Not on file  Stress: Not on file  Social Connections: Not on file     Family History: The patient's family history includes Diabetes in his mother; Emphysema in his father.  ROS:   Please see the history of present illness.     All other systems reviewed and are negative.  EKGs/Labs/Other Studies Reviewed:    The following studies were reviewed today:   EKG:  EKG is ordered today.  EKG shows normal sinus rhythm, normal ECG  Recent Labs: 04/26/2022: ALT 33; BUN 11; Creat 0.81; Hemoglobin 14.9; Platelets 267; Potassium 4.3; Sodium 134; TSH 0.91  Recent Lipid Panel    Component Value Date/Time   CHOL 100 04/26/2022 0812   CHOL 163 06/23/2015 0933   CHOL 209 (H) 02/28/2013 0324   TRIG 204  (H) 04/26/2022 0812   TRIG 247 (H) 02/28/2013 0324   HDL 31 (L) 04/26/2022 0812   HDL 41 06/23/2015 0933   HDL 38 (L) 02/28/2013 0324   CHOLHDL 3.2 04/26/2022 0812   VLDL 50 (H) 04/12/2022 0858   VLDL 49 (H) 02/28/2013 0324   LDLCALC 42 04/26/2022 0812   LDLCALC 122 (H) 02/28/2013 0324     Risk Assessment/Calculations:      Physical Exam:    VS:  BP 130/76 (BP Location: Left Arm)   Pulse 82   Ht  5\' 9"  (1.753 m)   Wt 178 lb (80.7 kg)   SpO2 97%   BMI 26.29 kg/m     Wt Readings from Last 3 Encounters:  09/26/22 178 lb (80.7 kg)  05/03/22 175 lb 3.2 oz (79.5 kg)  03/25/22 178 lb 9.6 oz (81 kg)     GEN:  Well nourished, well developed in no acute distress HEENT: Normal NECK: No JVD; No carotid bruits CARDIAC: RRR, no murmurs, rubs, gallops RESPIRATORY:  Clear to auscultation without rales, wheezing or rhonchi  ABDOMEN: Soft, non-tender, non-distended MUSCULOSKELETAL:  No edema; No deformity  SKIN: Warm and dry NEUROLOGIC:  Alert and oriented x 3 PSYCHIATRIC:  Normal affect   ASSESSMENT:    1. Coronary artery disease involving native coronary artery of native heart, unspecified whether angina present   2. Mixed hyperlipidemia   3. Primary hypertension   4. Smoking    PLAN:    In order of problems listed above:  CAD/MI s/p DES to LAD x 2.  Chest pain with overexertion, recent Myoview with no significant ischemia.  EF 60 to 65%.  Start Ranexa 500 mg twice daily. Continue aspirin, Plavix, Coreg, Crestor, fenofibrate.  Avoiding nitrates due to plans for Cialis use.  Hyperlipidemia, LDL at goal, triglycerides improving. Continue low-cholesterol diet, Vascepa, fenofibrate, Crestor 20 mg daily. hypertension, BP controlled.  Continue Coreg, lisinopril, HCTZ. Current smoker, smoking cessation advised.  Follow-up in 2 months  Medication Adjustments/Labs and Tests Ordered: Current medicines are reviewed at length with the patient today.  Concerns regarding medicines are  outlined above.  Orders Placed This Encounter  Procedures   EKG 12-Lead   Meds ordered this encounter  Medications   rosuvastatin (CRESTOR) 40 MG tablet    Sig: Take 1 tablet (40 mg total) by mouth daily.    Dispense:  90 tablet    Refill:  3   ranolazine (RANEXA) 500 MG 12 hr tablet    Sig: Take 1 tablet (500 mg total) by mouth 2 (two) times daily.    Dispense:  180 tablet    Refill:  0    Patient Instructions  Medication Instructions:   START Ranexa - Take one tablet ( 500mg ) by mouth twice a day.   *If you need a refill on your cardiac medications before your next appointment, please call your pharmacy*   Lab Work:  None Ordered  If you have labs (blood work) drawn today and your tests are completely normal, you will receive your results only by: [place] (if you have MyChart) OR A paper copy in the mail If you have any lab test that is abnormal or we need to change your treatment, we will call you to review the results.   Testing/Procedures:  None Ordered    Follow-Up: At [facility], you and your health needs are our priority.  As part of our continuing mission to provide you with exceptional heart care, we have created designated Provider Care Teams.  These Care Teams include your primary Cardiologist (physician) and Advanced Practice Providers (APPs -  Physician Assistants and Nurse Practitioners) who all work together to provide you with the care you need, when you need it.  We recommend signing up for the patient portal called "MyChart".  Sign up information is provided on this After Visit Summary.  MyChart is used to connect with patients for Virtual Visits (Telemedicine).  Patients are able to view lab/test results, encounter notes, upcoming appointments, etc.  Non-urgent messages can be sent  to your provider as well.   To learn more about what you can do with MyChart, go to [email or web address].    Your next appointment:   2  month(s)  Provider:   You may see Kate Sable, MD or one of the following Advanced Practice Providers on your designated Care Team:   Murray Hodgkins, NP Christell Faith, PA-C Cadence Kathlen Mody, PA-C Gerrie Nordmann, NP    Signed, Kate Sable, MD  09/26/2022 11:37 AM    [place]

## 2022-09-26 NOTE — Patient Instructions (Signed)
Medication Instructions:   START Ranexa - Take one tablet ( 500mg ) by mouth twice a day.   *If you need a refill on your cardiac medications before your next appointment, please call your pharmacy*   Lab Work:  None Ordered  If you have labs (blood work) drawn today and your tests are completely normal, you will receive your results only by: [place] (if you have MyChart) OR A paper copy in the mail If you have any lab test that is abnormal or we need to change your treatment, we will call you to review the results.   Testing/Procedures:  None Ordered    Follow-Up: At [facility], you and your health needs are our priority.  As part of our continuing mission to provide you with exceptional heart care, we have created designated Provider Care Teams.  These Care Teams include your primary Cardiologist (physician) and Advanced Practice Providers (APPs -  Physician Assistants and Nurse Practitioners) who all work together to provide you with the care you need, when you need it.  We recommend signing up for the patient portal called "MyChart".  Sign up information is provided on this After Visit Summary.  MyChart is used to connect with patients for Virtual Visits (Telemedicine).  Patients are able to view lab/test results, encounter notes, upcoming appointments, etc.  Non-urgent messages can be sent to your provider as well.   To learn more about what you can do with MyChart, go to [email or web address].    Your next appointment:   2 month(s)  Provider:   You may see Kate Sable, MD or one of the following Advanced Practice Providers on your designated Care Team:   Murray Hodgkins, NP Christell Faith, PA-C Cadence Kathlen Mody, PA-C Gerrie Nordmann, NP

## 2022-09-29 ENCOUNTER — Other Ambulatory Visit: Payer: Self-pay | Admitting: Family Medicine

## 2022-09-29 DIAGNOSIS — K219 Gastro-esophageal reflux disease without esophagitis: Secondary | ICD-10-CM

## 2022-09-30 NOTE — Telephone Encounter (Signed)
Requested Prescriptions  Pending Prescriptions Disp Refills   pantoprazole (PROTONIX) 40 MG tablet [Pharmacy Med Name: Pantoprazole Sodium 40 MG Oral Tablet Delayed Release] 90 tablet 0    Sig: TAKE 1 TABLET BY MOUTH ONCE DAILY BEFORE BREAKFAST     Gastroenterology: Proton Pump Inhibitors Passed - 09/29/2022 12:46 PM      Passed - Valid encounter within last 12 months    Recent Outpatient Visits           5 months ago Annual physical exam   [facility] Olin Hauser, DO   10 months ago Chronic myofascial pain   [facility] Olin Hauser, DO   11 months ago Type 2 diabetes mellitus with other specified complication, without long-term current use of insulin [facility])   [place], DO   1 year ago Annual physical exam   [facility] [place], Devonne Doughty, DO   2 years ago Type 2 diabetes mellitus with other specified complication, without long-term current use of insulin [facility])   [place], [place], DO       Future Appointments             In 1 month Agbor-Etang, Aaron Edelman, MD [place] [facility]

## 2022-10-07 ENCOUNTER — Other Ambulatory Visit: Payer: Self-pay | Admitting: Family Medicine

## 2022-10-07 ENCOUNTER — Ambulatory Visit (INDEPENDENT_AMBULATORY_CARE_PROVIDER_SITE_OTHER): Payer: 59 | Admitting: Family Medicine

## 2022-10-07 VITALS — BP 120/70 | HR 79 | Resp 16 | Ht 69.0 in | Wt 179.8 lb

## 2022-10-07 DIAGNOSIS — F5104 Psychophysiologic insomnia: Secondary | ICD-10-CM | POA: Diagnosis not present

## 2022-10-07 DIAGNOSIS — E1169 Type 2 diabetes mellitus with other specified complication: Secondary | ICD-10-CM

## 2022-10-07 DIAGNOSIS — N529 Male erectile dysfunction, unspecified: Secondary | ICD-10-CM | POA: Diagnosis not present

## 2022-10-07 DIAGNOSIS — I2510 Atherosclerotic heart disease of native coronary artery without angina pectoris: Secondary | ICD-10-CM

## 2022-10-07 DIAGNOSIS — I10 Essential (primary) hypertension: Secondary | ICD-10-CM | POA: Diagnosis not present

## 2022-10-07 DIAGNOSIS — F331 Major depressive disorder, recurrent, moderate: Secondary | ICD-10-CM

## 2022-10-07 DIAGNOSIS — R41840 Attention and concentration deficit: Secondary | ICD-10-CM

## 2022-10-07 DIAGNOSIS — K219 Gastro-esophageal reflux disease without esophagitis: Secondary | ICD-10-CM

## 2022-10-07 DIAGNOSIS — M62830 Muscle spasm of back: Secondary | ICD-10-CM

## 2022-10-07 DIAGNOSIS — R351 Nocturia: Secondary | ICD-10-CM

## 2022-10-07 DIAGNOSIS — Z0000 Encounter for general adult medical examination without abnormal findings: Secondary | ICD-10-CM

## 2022-10-07 LAB — POCT GLYCOSYLATED HEMOGLOBIN (HGB A1C): Hemoglobin A1C: 8.2 % — AB (ref 4.0–5.6)

## 2022-10-07 MED ORDER — PANTOPRAZOLE SODIUM 40 MG PO TBEC: 40.0000 mg | DELAYED_RELEASE_TABLET | Freq: Every day | ORAL | 5 refills | Status: DC

## 2022-10-07 MED ORDER — CYCLOBENZAPRINE HCL 10 MG PO TABS: 10.0000 mg | ORAL_TABLET | Freq: Every day | ORAL | 5 refills | Status: DC

## 2022-10-07 MED ORDER — TADALAFIL 20 MG PO TABS: 20.0000 mg | ORAL_TABLET | ORAL | 3 refills | Status: AC | PRN

## 2022-10-07 MED ORDER — DULOXETINE HCL 60 MG PO CPEP: 60.0000 mg | ORAL_CAPSULE | Freq: Every day | ORAL | 5 refills | Status: DC

## 2022-10-07 MED ORDER — TRAZODONE HCL 100 MG PO TABS: 100.0000 mg | ORAL_TABLET | Freq: Every day | ORAL | 5 refills | Status: DC

## 2022-10-07 MED ORDER — CARVEDILOL 25 MG PO TABS: 25.0000 mg | ORAL_TABLET | Freq: Two times a day (BID) | ORAL | 5 refills | Status: DC

## 2022-10-07 NOTE — Progress Notes (Signed)
Subjective:    Patient ID: Clifford Morse, male    DOB: [birth year], 62 y.o.   MRN: [id number]  Clifford Morse is a 62 y.o. male presenting on 10/07/2022 for Diabetes and Hypertension   HPI  Chronic pain syndrome Chronic Myofascial pain R Hip Pain Low Back Pain  Tried Gabapentin 600mg  nightly, Flexeril 10mg  TID PRN, Duloxetine 60mg   Chronic problem with R hip pain  Chronic Hip / Knee pain Followed now by [facility] for pain management   CHRONIC HTN: Reports home BP normal range 120s Current Meds - Carvedilol 25mg  BID, Lisinopril-HCTZ 10-12.5mg    Reports good compliance, took meds today. Tolerating well, w/o complaints. Denies CP, dyspnea, HA, edema, dizziness / lightheadedness   DM, Type 2 / Possible neuropathy complication Prior A1c >10 He has improved his diet since last visit in 05/2022, we dose increased Metformin XR to 750mg  twice a day He is doing well currently. CBGs: Results 120-140 avg Meds: Metformin XR 750mg  TWICE a day with meal Currently on ACEi already Lifestyle: - Diet (still improving low carb options) Will go to [facility] when ready History of occasional foot stinging symptoms Denies hypoglycemia, polyuria, visual changes, numbness or tingling.   HYPERLIPIDEMIA: - Reports concerns. Last lipid panel with elevated TG, cardiology raised his Statin therapy from 20 to 40mg  dose Rosuvastatin - Currently taking Rosuvastatin 40mg , tolerating well without side effects or myalgias   Chronic Pain Syndrome Chronic Hip / Knee pain     CAD S/p Left Heart Cath by Dr Kirke Corin 06/19/20 He had another cardiac stent placed   Centrilobular Emphysema / COPD Followed by [facility] Dr Jayme Cloud Previously on Trelegy and then switched to trial on Breztri and did not gain benefit Lowest cost on Trelegy was $200 with financial assistance, he could not afford this       10/07/2022    3:57 PM 05/03/2022    8:55 AM 11/30/2021   11:41 AM  Depression screen PHQ 2/9   Decreased Interest 3 3 3   Down, Depressed, Hopeless 1 1 1   PHQ - 2 Score 4 4 4   Altered sleeping 2 2 1   Tired, decreased energy 3 3 3   Change in appetite 1 2 2   Feeling bad or failure about yourself  0 1 0  Trouble concentrating 0 3 3  Moving slowly or fidgety/restless 0 0 1  Suicidal thoughts 2 1 0  PHQ-9 Score 12 16 14   Difficult doing work/chores Not difficult at all Very difficult Extremely dIfficult    Social History   Tobacco Use   Smoking status: Every Day    Packs/day: 2.00    Years: 40.00    Additional pack years: 0.00    Total pack years: 80.00    Types: Cigarettes   Smokeless tobacco: Former   Tobacco comments:    1PPD   Vaping Use   Vaping Use: Never used  Substance Use Topics   Alcohol use: Yes    Alcohol/week: 0.0 standard drinks of alcohol    Comment: rare   Drug use: No    Review of Systems Per HPI unless specifically indicated above     Objective:    BP 120/70 (BP Location: Right Arm, Patient Position: Sitting, Cuff Size: Normal)   Pulse 79   Resp 16   Ht 5\' 9"  (1.753 m)   Wt 179 lb 12.8 oz (81.6 kg)   BMI 26.55 kg/m   Wt Readings from Last 3 Encounters:  10/07/22 179 lb 12.8  oz (81.6 kg)  09/26/22 178 lb (80.7 kg)  05/03/22 175 lb 3.2 oz (79.5 kg)    Physical Exam Vitals and nursing note reviewed.  Constitutional:      General: He is not in acute distress.    Appearance: Normal appearance. He is well-developed. He is not diaphoretic.     Comments: Well-appearing, comfortable, cooperative  HENT:     Head: Normocephalic and atraumatic.  Eyes:     General:        Right eye: No discharge.        Left eye: No discharge.     Conjunctiva/sclera: Conjunctivae normal.  Cardiovascular:     Rate and Rhythm: Normal rate.  Pulmonary:     Effort: Pulmonary effort is normal.  Skin:    General: Skin is warm and dry.     Findings: No erythema or rash.  Neurological:     Mental Status: He is alert and oriented to person, place, and time.   Psychiatric:        Mood and Affect: Mood normal.        Behavior: Behavior normal.        Thought Content: Thought content normal.     Comments: Well groomed, good eye contact, normal speech and thoughts    Results for orders placed or performed in visit on 10/07/22  POCT glycosylated hemoglobin (Hb A1C)  Result Value Ref Range   Hemoglobin A1C 8.2 (A) 4.0 - 5.6 %      Assessment & Plan:   Problem List Items Addressed This Visit     Essential hypertension    Controlled - Home BP readings limited Complication with known CAD    Plan:  1. Continue current BP regimen - Carvedilol 25mg  BID, Lisinoipril-HCTZ 10-12.5mg  daily 2. Encourage improved lifestyle - low sodium diet, regular exercise improve 3. May monitor BP outside office, bring readings to next visit, if persistently >140/90 or new symptoms notify office sooner      Relevant Medications   carvedilol (COREG) 25 MG tablet   tadalafil (CIALIS) 20 MG tablet   GERD (gastroesophageal reflux disease)   Relevant Medications   pantoprazole (PROTONIX) 40 MG tablet   Major depressive disorder, recurrent, moderate   Relevant Medications   DULoxetine (CYMBALTA) 60 MG capsule   traZODone (DESYREL) 100 MG tablet   Psychophysiological insomnia   Relevant Medications   traZODone (DESYREL) 100 MG tablet   Type 2 diabetes mellitus with other specified complication - Primary    A1c improved from 10.7 to 8.2 with lifestyle diet improvements and Metformin XR 750 TWICE A DAY dosing Complications - Hyperlipidemia and CAD, Retinopathy Strong fam history of DIABETES Not on GLP1  Encouraged by improved CBG  Plan:  1. Continue Metformin XR 750mg  TWICE A DAY dosing 2. Continue ASA, ACEi, Statin Urine micro      Relevant Orders   POCT glycosylated hemoglobin (Hb A1C) (Completed)   Urine Microalbumin w/creat. ratio   Other Visit Diagnoses     Coronary artery disease involving native coronary artery of native heart without angina  pectoris       Relevant Medications   carvedilol (COREG) 25 MG tablet   tadalafil (CIALIS) 20 MG tablet   Moderate episode of recurrent major depressive disorder       Relevant Medications   DULoxetine (CYMBALTA) 60 MG capsule   traZODone (DESYREL) 100 MG tablet   Back muscle spasm       Relevant Medications   cyclobenzaprine (FLEXERIL) 10  MG tablet   Inattention       Erectile dysfunction, unspecified erectile dysfunction type       Relevant Medications   tadalafil (CIALIS) 20 MG tablet       A1c improved from 10.7 to 8.2 with lifestyle diet improvements and Metformin XR 750 TWICE A DAY dosing  Refilled all meds for 30 day + 5 refills, we may come up 30 days short of your next apt but I can add extra in the future if yo need.  Inattention / Focus Suspected ADD Major depression moderate, inadequately treated Continue Duloxetine, Trazodone  Please check into this list for an available Mental Health provider for Psychology or Therapist who can do ADD Testing for Inattention, and we can help treat this going forward after they do their evaluation. If you find a place in network, I can submit a referral. Or let me know if you need help.  ED Generic Tadalafil Cialis printed w/ goodrx coupon 90 pills  Meds ordered this encounter  Medications   carvedilol (COREG) 25 MG tablet    Sig: Take 1 tablet (25 mg total) by mouth 2 (two) times daily with a meal.    Dispense:  60 tablet    Refill:  5    Change to 30 day with refills   DULoxetine (CYMBALTA) 60 MG capsule    Sig: Take 1 capsule (60 mg total) by mouth daily.    Dispense:  30 capsule    Refill:  5    Change to 30 day with refills   cyclobenzaprine (FLEXERIL) 10 MG tablet    Sig: Take 1 tablet (10 mg total) by mouth at bedtime.    Dispense:  30 tablet    Refill:  5    Change to 30 day with refills   pantoprazole (PROTONIX) 40 MG tablet    Sig: Take 1 tablet (40 mg total) by mouth daily before breakfast.    Dispense:  30  tablet    Refill:  5    Change to 30 day with refills   traZODone (DESYREL) 100 MG tablet    Sig: Take 1 tablet (100 mg total) by mouth at bedtime.    Dispense:  30 tablet    Refill:  5    Change to 30 day with refills   tadalafil (CIALIS) 20 MG tablet    Sig: Take 1 tablet (20 mg total) by mouth every other day as needed for erectile dysfunction.    Dispense:  90 tablet    Refill:  3      Follow up plan: Return in about 7 months (around 05/09/2023) for 7 month fasting lab only 1 week later Annual Physical.  Future labs ordered for 05/2023   Saralyn PilarAlexander Marlaya Turck, DO [facility] 10/07/2022, 4:03 PM

## 2022-10-07 NOTE — Patient Instructions (Addendum)
Thank you for coming to the office today.  Recent Labs    04/26/22 0812 10/07/22 1621  HGBA1C 10.7* 8.2*   Great job Keep on Metformin XR 750 twice a day Continue improving diet and lifestyle  Sugar readings are definitely improving.  Refilled all meds for 30 day + 5 refills, we may come up 30 days short of your next apt but I can add extra in the future if yo need.  Please check into this list for an available Mental Health provider for Psychology or Therapist who can do ADD Testing for Inattention, and we can help treat this going forward after they do their evaluation.  If you find a place in network, I can submit a referral. Or let me know if you need help.  Generic Tadalafil Cialis printed w/ goodrx coupon 90 pills   These offices have both PSYCHIATRY doctors and THERAPISTS  [facility], water quality Available) [place] [address] [place], [place] [address] Phone: [phone]  [facility] Address: [address], [place], [place] [address] [email or web address] Phone: [phone]  [facility]) Address: [address], [place], [place] [address] Hours: 8:30AM-5PM Phone: [phone]  [facility] (Adult, Peds, Geriatric, Counseling) [address] [place], [place] [address] Phone: [phone] Fax: [phone]  [facility] [address] [place], [place] [address] Phone: [phone]  [facility] (All ages) [address], [place] Kn[place], [id number] Phone: [phone] (Option 1) [email or web address]  ----------------------------------------------------------------- THERAPIST ONLY  (No Psychiatry)  [facility] [address] [place], [place] [address] [place] P: [phone]  [facility]) [facility]  Through Healing Therapy, [facility] [address] [place], [place] [address] [phone]  [facility]   Address: [address] [place], [place], [place] [address] Hours: Open today  9AM-7PM Phone: [phone]  [facility] [address] [facility]  - [facility] Address: [address] [place], [place] [address] Phone: [phone]  [facility] [place], [place] [address] Phone: [phone]  DUE for FASTING BLOOD WORK (no food or drink after midnight before the lab appointment, only water or coffee without cream/sugar on the morning of)  SCHEDULE "Lab Only" visit in the morning at the clinic for lab draw in 7 MONTHS   - Make sure Lab Only appointment is at about 1 week before your next appointment, so that results will be available  For Lab Results, once available within 2-3 days of blood draw, you can can log in to MyChart online to view your results and a brief explanation. Also, we can discuss results at next follow-up visit.    Please schedule a Follow-up Appointment to: Return in about 7 months (around 05/09/2023) for 7 month fasting lab only 1 week later Annual Physical.  If you have any other questions or concerns, please feel free to call the office or send a message through MyChart. You may also schedule an earlier appointment if necessary.  Additionally, you may be receiving a survey about your experience at our office within a few days to 1 week by e-mail or mail. We value your feedback.  Saralyn Pilar, DO [facility], [place]

## 2022-10-07 NOTE — Assessment & Plan Note (Signed)
Controlled - Home BP readings limited Complication with known CAD    Plan:  1. Continue current BP regimen - Carvedilol 25mg  BID, Lisinoipril-HCTZ 10-12.5mg  daily 2. Encourage improved lifestyle - low sodium diet, regular exercise improve 3. May monitor BP outside office, bring readings to next visit, if persistently >140/90 or new symptoms notify office sooner

## 2022-10-07 NOTE — Assessment & Plan Note (Signed)
A1c improved from 10.7 to 8.2 with lifestyle diet improvements and Metformin XR 750 TWICE A DAY dosing Complications - Hyperlipidemia and CAD, Retinopathy Strong fam history of DIABETES Not on GLP1  Encouraged by improved CBG  Plan:  1. Continue Metformin XR 750mg  TWICE A DAY dosing 2. Continue ASA, ACEi, Statin Urine micro

## 2022-10-08 LAB — MICROALBUMIN / CREATININE URINE RATIO
Creatinine, Urine: 80 mg/dL (ref 20–320)
Microalb Creat Ratio: 6 mg/g creat (ref <30)
Microalb, Ur: 0.5 mg/dL

## 2022-11-23 ENCOUNTER — Other Ambulatory Visit: Payer: Self-pay | Admitting: Physician Assistant

## 2022-11-23 ENCOUNTER — Other Ambulatory Visit: Payer: Self-pay | Admitting: Family Medicine

## 2022-11-23 DIAGNOSIS — I10 Essential (primary) hypertension: Secondary | ICD-10-CM

## 2022-11-23 DIAGNOSIS — E1169 Type 2 diabetes mellitus with other specified complication: Secondary | ICD-10-CM

## 2022-11-26 NOTE — Telephone Encounter (Signed)
Requested Prescriptions  Pending Prescriptions Disp Refills   metFORMIN (GLUCOPHAGE-XR) 750 MG 24 hr tablet [Pharmacy Med Name: metFORMIN HCl ER 750 MG Oral Tablet Extended Release 24 Hour] 180 tablet 1    Sig: TAKE 1 TABLET BY MOUTH TWICE DAILY WITH A MEAL     Endocrinology:  Diabetes - Biguanides Failed - 11/23/2022  7:30 AM      Failed - HBA1C is between 0 and 7.9 and within 180 days    Hemoglobin A1C  Date Value Ref Range Status  10/07/2022 8.2 (A) 4.0 - 5.6 % Final  02/27/2013 5.9 4.2 - 6.3 % Final    Comment:    The American Diabetes Association recommends that a primary goal of therapy should be <7% and that physicians should reevaluate the treatment regimen in patients with HbA1c values consistently >8%.    Hgb A1c MFr Bld  Date Value Ref Range Status  04/26/2022 10.7 (H) <5.7 % of total Hgb Final    Comment:    For someone without known diabetes, a hemoglobin A1c value of 6.5% or greater indicates that they may have  diabetes and this should be confirmed with a follow-up  test. . For someone with known diabetes, a value <7% indicates  that their diabetes is well controlled and a value  greater than or equal to 7% indicates suboptimal  control. A1c targets should be individualized based on  duration of diabetes, age, comorbid conditions, and  other considerations. . Currently, no consensus exists regarding use of hemoglobin A1c for diagnosis of diabetes for children. .          Failed - B12 Level in normal range and within 720 days    No results found for: "VITAMINB12"       Passed - Cr in normal range and within 360 days    Creat  Date Value Ref Range Status  04/26/2022 0.81 0.70 - 1.35 mg/dL Final   Creatinine, Urine  Date Value Ref Range Status  10/07/2022 80 20 - 320 mg/dL Final         Passed - eGFR in normal range and within 360 days    GFR, Est African American  Date Value Ref Range Status  04/18/2020 113 > OR = 60 mL/min/1.71m2 Final   GFR calc  Af Amer  Date Value Ref Range Status  05/22/2020 118 >59 mL/min/1.73 Final    Comment:    **In accordance with recommendations from the NKF-ASN Task force,**   [facility] is in the process of updating its eGFR calculation to the   2021 CKD-EPI creatinine equation that estimates kidney function   without a race variable.    GFR, Est Non African American  Date Value Ref Range Status  04/18/2020 97 > OR = 60 mL/min/1.61m2 Final   GFR, Estimated  Date Value Ref Range Status  06/20/2020 >60 >60 mL/min Final    Comment:    (NOTE) Calculated using the CKD-EPI Creatinine Equation (2021)    eGFR  Date Value Ref Range Status  04/26/2022 100 > OR = 60 mL/min/1.24m2 Final         Passed - Valid encounter within last 6 months    Recent Outpatient Visits           1 month ago Type 2 diabetes mellitus with other specified complication, without long-term current use of insulin [facility])   [place] [facility] [place], Clifford Neat, DO   6 months ago Annual physical exam   [facility] Smitty Cords, DO   12 months ago Chronic myofascial pain   [place] [facility] [place], Clifford Neat, DO   1 year ago Type 2 diabetes mellitus with other specified complication, without long-term current use of insulin [facility])   [place] [facility] [place], Clifford Neat, DO   1 year ago Annual physical exam   [place] [facility] Smitty Cords, DO       Future Appointments             Tomorrow Debbe Odea, MD [facility] [place]   In 5 months Althea Charon, Clifford Neat, DO [place] [facility]            Passed - CBC within normal limits and completed in the last 12 months    WBC  Date Value Ref Range Status  04/26/2022 8.2 3.8 - 10.8 Thousand/uL Final   RBC  Date Value Ref Range Status  04/26/2022 4.71 4.20 - 5.80  Million/uL Final   Hemoglobin  Date Value Ref Range Status  04/26/2022 14.9 13.2 - 17.1 g/dL Final  16/04/9603 54.0 13.0 - 17.7 g/dL Final   HCT  Date Value Ref Range Status  04/26/2022 42.5 38.5 - 50.0 % Final   Hematocrit  Date Value Ref Range Status  05/22/2020 41.4 37.5 - 51.0 % Final   MCHC  Date Value Ref Range Status  04/26/2022 35.1 32.0 - 36.0 g/dL Final   [facility]  Date Value Ref Range Status  04/26/2022 31.6 27.0 - 33.0 pg Final   MCV  Date Value Ref Range Status  04/26/2022 90.2 80.0 - 100.0 fL Final  05/22/2020 88 79 - 97 fL Final  02/27/2013 91 80 - 100 fL Final   No results found for: "PLTCOUNTKUC", "LABPLAT", "POCPLA" RDW  Date Value Ref Range Status  04/26/2022 11.6 11.0 - 15.0 % Final  05/22/2020 11.7 11.6 - 15.4 % Final  02/27/2013 12.1 11.5 - 14.5 % Final

## 2022-11-27 ENCOUNTER — Ambulatory Visit: Payer: 59 | Attending: Cardiology | Admitting: Cardiology

## 2022-11-27 ENCOUNTER — Encounter: Payer: Self-pay | Admitting: Cardiology

## 2022-11-27 VITALS — BP 132/74 | HR 86 | Ht 69.0 in | Wt 182.8 lb

## 2022-11-27 DIAGNOSIS — I10 Essential (primary) hypertension: Secondary | ICD-10-CM

## 2022-11-27 DIAGNOSIS — I2510 Atherosclerotic heart disease of native coronary artery without angina pectoris: Secondary | ICD-10-CM

## 2022-11-27 DIAGNOSIS — E782 Mixed hyperlipidemia: Secondary | ICD-10-CM

## 2022-11-27 NOTE — Patient Instructions (Signed)
Medication Instructions:   Your physician recommends that you continue on your current medications as directed. Please refer to the Current Medication list given to you today.  *If you need a refill on your cardiac medications before your next appointment, please call your pharmacy*   Lab Work:  None Ordered  If you have labs (blood work) drawn today and your tests are completely normal, you will receive your results only by: MyChart Message (if you have MyChart) OR A paper copy in the mail If you have any lab test that is abnormal or we need to change your treatment, we will call you to review the results.   Testing/Procedures:  None Ordered   Follow-Up: At [facility], you and your health needs are our priority.  As part of our continuing mission to provide you with exceptional heart care, we have created designated Provider Care Teams.  These Care Teams include your primary Cardiologist (physician) and Advanced Practice Providers (APPs -  Physician Assistants and Nurse Practitioners) who all work together to provide you with the care you need, when you need it.  We recommend signing up for the patient portal called "MyChart".  Sign up information is provided on this After Visit Summary.  MyChart is used to connect with patients for Virtual Visits (Telemedicine).  Patients are able to view lab/test results, encounter notes, upcoming appointments, etc.  Non-urgent messages can be sent to your provider as well.   To learn more about what you can do with MyChart, go to [email or web address].    Your next appointment:   6 month(s)  Provider:   You may see Brian Agbor-Etang, MD or one of the following Advanced Practice Providers on your designated Care Team:   Christopher Berge, NP Ryan Dunn, PA-C Cadence Furth, PA-C Sheri Hammock, NP 

## 2022-11-27 NOTE — Progress Notes (Signed)
Cardiology Office Note:    Date:  11/27/2022   ID:  Clifford Morse, DOB 01/23/1961, MRN [id number]  PCP:  Smitty Cords, DO  [facility] Cardiologist:  Debbe Odea, MD  [facility] Electrophysiologist:  None   Referring MD: Saralyn Pilar *   Chief Complaint  Patient presents with   Follow-up    No acute cardiac concerns     History of Present Illness:    Clifford Morse is a 62 y.o. male with a hx of MI/CAD (s/p DES to mid LAD 03/2013, recent mid to distal LAD stent 06/19/2020), hypertension, hyperlipidemia, current smoker x35+ years, COPD who presents for follow-up.   Started on Ranexa 500 mg twice daily due to symptoms of chest pain after last visit.  Compliant with medications as prescribed.  States his chest pain symptoms have resolved, feels well, no concerns at this time.  Prior notes [name] 10/2021 no significant ischemia. Had an MI in 2014, drug-eluting stent was placed to the LAD. Left heart cath 06/19/2020 patent mid LAD stent, mid to distal LAD 80% disease status post DES. Mild left circumflex and RCA disease,  Echo 05/24/2020 showed normal systolic and diastolic function, EF 60 to 65%  Past Medical History:  Diagnosis Date   Allergy    Arthritis    Asthma    Chest pain on exertion    Diabetes mellitus, type 2 (HCC)    GERD (gastroesophageal reflux disease)    Hyperlipidemia    Vertigo    with sinus issues    Past Surgical History:  Procedure Laterality Date   CARDIAC CATHETERIZATION  2014   stent   CORONARY STENT INTERVENTION  2014   CORONARY STENT INTERVENTION N/A 06/19/2020   Procedure: CORONARY STENT INTERVENTION;  Surgeon: Iran Ouch, MD;  Location: [facility] INVASIVE CV LAB;  Service: Cardiovascular;  Laterality: N/A;   ESOPHAGOGASTRODUODENOSCOPY     HERNIA REPAIR     LEFT HEART CATH AND CORONARY ANGIOGRAPHY Left 06/19/2020   Procedure: LEFT HEART CATH AND CORONARY ANGIOGRAPHY;  Surgeon: Iran Ouch,  MD;  Location: [facility] INVASIVE CV LAB;  Service: Cardiovascular;  Laterality: Left;    Current Medications: Current Meds  Medication Sig   albuterol (VENTOLIN HFA) 108 (90 Base) MCG/ACT inhaler Inhale 2 puffs into the lungs every 6 (six) hours as needed for wheezing or shortness of breath.   aspirin 81 MG tablet Take 81 mg by mouth daily.   carvedilol (COREG) 25 MG tablet Take 1 tablet (25 mg total) by mouth 2 (two) times daily with a meal.   clopidogrel (PLAVIX) 75 MG tablet Take 1 tablet (75 mg total) by mouth daily.   cyclobenzaprine (FLEXERIL) 10 MG tablet Take 1 tablet (10 mg total) by mouth at bedtime.   DULoxetine (CYMBALTA) 60 MG capsule Take 1 capsule (60 mg total) by mouth daily.   fenofibrate 160 MG tablet Take 1 tablet (160 mg total) by mouth daily.   fluticasone (FLONASE) 50 MCG/ACT nasal spray Place 1 spray into both nostrils daily as needed for allergies or rhinitis.   gabapentin (NEURONTIN) 600 MG tablet TAKE 1 TABLET BY MOUTH AT BEDTIME (Patient taking differently: Take 600 mg by mouth 3 (three) times daily.)   icosapent Ethyl (VASCEPA) 1 g capsule Take 2 capsules by mouth twice daily   lisinopril-hydrochlorothiazide (ZESTORETIC) 10-12.5 MG tablet Take 1 tablet by mouth once daily   metFORMIN (GLUCOPHAGE-XR) 750 MG 24 hr tablet TAKE 1 TABLET BY MOUTH TWICE DAILY WITH A  MEAL   pantoprazole (PROTONIX) 40 MG tablet Take 1 tablet (40 mg total) by mouth daily before breakfast.   pseudoephedrine-acetaminophen (TYLENOL SINUS) 30-500 MG TABS tablet Take 2 tablets by mouth 2 (two) times daily as needed (congestion).   ranolazine (RANEXA) 500 MG 12 hr tablet Take 1 tablet (500 mg total) by mouth 2 (two) times daily.   rosuvastatin (CRESTOR) 40 MG tablet Take 1 tablet (40 mg total) by mouth daily.   tadalafil (CIALIS) 20 MG tablet Take 1 tablet (20 mg total) by mouth every other day as needed for erectile dysfunction.   traZODone (DESYREL) 100 MG tablet Take 1 tablet (100 mg total) by  mouth at bedtime.     Allergies:   Crestor [rosuvastatin]   Social History   Socioeconomic History   Marital status: Married    Spouse name: Marylu Lund    Number of children: 2   Years of education: Not on file   Highest education level: Not on file  Occupational History   Not on file  Tobacco Use   Smoking status: Every Day    Packs/day: 2.00    Years: 40.00    Additional pack years: 0.00    Total pack years: 80.00    Types: Cigarettes   Smokeless tobacco: Former   Tobacco comments:    1PPD   Vaping Use   Vaping Use: Never used  Substance and Sexual Activity   Alcohol use: Yes    Alcohol/week: 0.0 standard drinks of alcohol    Comment: rare   Drug use: No   Sexual activity: Not on file  Other Topics Concern   Not on file  Social History Narrative   Lives at home with wife   Social Determinants of Health   Financial Resource Strain: Not on file  Food Insecurity: No Food Insecurity (04/03/2022)   Hunger Vital Sign    Worried About Running Out of Food in the Last Year: Never true    Ran Out of Food in the Last Year: Never true  Transportation Needs: No Transportation Needs (04/03/2022)   PRAPARE - Administrator, Civil Service (Medical): No    Lack of Transportation (Non-Medical): No  Physical Activity: Not on file  Stress: Not on file  Social Connections: Not on file     Family History: The patient's family history includes Diabetes in his mother; Emphysema in his father.  ROS:   Please see the history of present illness.     All other systems reviewed and are negative.  EKGs/Labs/Other Studies Reviewed:    The following studies were reviewed today:   EKG:  EKG is ordered today.  EKG shows normal sinus rhythm, normal ECG  Recent Labs: 04/26/2022: ALT 33; BUN 11; Creat 0.81; Hemoglobin 14.9; Platelets 267; Potassium 4.3; Sodium 134; TSH 0.91  Recent Lipid Panel    Component Value Date/Time   CHOL 100 04/26/2022 0812   CHOL 163 06/23/2015  0933   CHOL 209 (H) 02/28/2013 0324   TRIG 204 (H) 04/26/2022 0812   TRIG 247 (H) 02/28/2013 0324   HDL 31 (L) 04/26/2022 0812   HDL 41 06/23/2015 0933   HDL 38 (L) 02/28/2013 0324   CHOLHDL 3.2 04/26/2022 0812   VLDL 50 (H) 04/12/2022 0858   VLDL 49 (H) 02/28/2013 0324   LDLCALC 42 04/26/2022 0812   LDLCALC 122 (H) 02/28/2013 0324     Risk Assessment/Calculations:      Physical Exam:    VS:  BP 132/74 (  BP Location: Left Arm, Patient Position: Sitting)   Pulse 86   Ht 5\' 9"  (1.753 m)   Wt 182 lb 12.8 oz (82.9 kg)   SpO2 94%   BMI 26.99 kg/m     Wt Readings from Last 3 Encounters:  11/27/22 182 lb 12.8 oz (82.9 kg)  10/07/22 179 lb 12.8 oz (81.6 kg)  09/26/22 178 lb (80.7 kg)     GEN:  Well nourished, well developed in no acute distress HEENT: Normal NECK: No JVD; No carotid bruits CARDIAC: RRR, no murmurs, rubs, gallops RESPIRATORY:  Clear to auscultation without rales, wheezing or rhonchi  ABDOMEN: Soft, non-tender, non-distended MUSCULOSKELETAL:  No edema; No deformity  SKIN: Warm and dry NEUROLOGIC:  Alert and oriented x 3 PSYCHIATRIC:  Normal affect   ASSESSMENT:    1. Coronary artery disease involving native coronary artery of native heart, unspecified whether angina present   2. Mixed hyperlipidemia   3. Primary hypertension    PLAN:    In order of problems listed above:  CAD/MI s/p DES to LAD x 2 (2014).  Denies chest pain.Myoview 5/23 with no significant ischemia.  EF 60 to 65%.  Continue Ranexa 500 mg twice daily, aspirin, Plavix, Coreg, Crestor, fenofibrate.  Avoiding nitrates due to plans for Cialis use.  Hyperlipidemia, LDL at goal. Continue low-cholesterol diet, Vascepa, fenofibrate, Crestor 20 mg daily. hypertension, BP controlled.  Continue Coreg, lisinopril, HCTZ.   Follow-up in 6 months  Medication Adjustments/Labs and Tests Ordered: Current medicines are reviewed at length with the patient today.  Concerns regarding medicines are  outlined above.  No orders of the defined types were placed in this encounter.  No orders of the defined types were placed in this encounter.   Patient Instructions  Medication Instructions:   Your physician recommends that you continue on your current medications as directed. Please refer to the Current Medication list given to you today.  *If you need a refill on your cardiac medications before your next appointment, please call your pharmacy*   Lab Work:  None Ordered  If you have labs (blood work) drawn today and your tests are completely normal, you will receive your results only by: MyChart Message (if you have MyChart) OR A paper copy in the mail If you have any lab test that is abnormal or we need to change your treatment, we will call you to review the results.   Testing/Procedures:  None Ordered   Follow-Up: At [facility], you and your health needs are our priority.  As part of our continuing mission to provide you with exceptional heart care, we have created designated Provider Care Teams.  These Care Teams include your primary Cardiologist (physician) and Advanced Practice Providers (APPs -  Physician Assistants and Nurse Practitioners) who all work together to provide you with the care you need, when you need it.  We recommend signing up for the patient portal called "MyChart".  Sign up information is provided on this After Visit Summary.  MyChart is used to connect with patients for Virtual Visits (Telemedicine).  Patients are able to view lab/test results, encounter notes, upcoming appointments, etc.  Non-urgent messages can be sent to your provider as well.   To learn more about what you can do with MyChart, go to [email or web address].    Your next appointment:   6 month(s)  Provider:   You may see Debbe Odea, MD or one of the following Advanced Practice Providers on your designated Care Team:  Nicolasa Ducking, NP Eula Listen,  PA-C Cadence Fransico Michael, PA-C Charlsie Quest, NP    Signed, Debbe Odea, MD  11/27/2022 3:31 PM    [facility]

## 2023-01-07 ENCOUNTER — Other Ambulatory Visit: Payer: Self-pay

## 2023-01-07 MED ORDER — RANOLAZINE ER 500 MG PO TB12: 500.0000 mg | ORAL_TABLET | Freq: Two times a day (BID) | ORAL | 0 refills | Status: DC

## 2023-01-25 ENCOUNTER — Other Ambulatory Visit: Payer: Self-pay | Admitting: Family Medicine

## 2023-01-25 DIAGNOSIS — G894 Chronic pain syndrome: Secondary | ICD-10-CM

## 2023-01-25 DIAGNOSIS — G8929 Other chronic pain: Secondary | ICD-10-CM

## 2023-01-27 NOTE — Telephone Encounter (Signed)
Requested Prescriptions  Pending Prescriptions Disp Refills   gabapentin (NEURONTIN) 600 MG tablet [Pharmacy Med Name: Gabapentin 600 MG Oral Tablet] 90 tablet 0    Sig: TAKE 1 TABLET BY MOUTH AT BEDTIME     Neurology: Anticonvulsants - gabapentin Passed - 01/25/2023  9:43 AM      Passed - Cr in normal range and within 360 days    Creat  Date Value Ref Range Status  04/26/2022 0.81 0.70 - 1.35 mg/dL Final   Creatinine, Urine  Date Value Ref Range Status  10/07/2022 80 20 - 320 mg/dL Final         Passed - Completed PHQ-2 or PHQ-9 in the last 360 days      Passed - Valid encounter within last 12 months    Recent Outpatient Visits           3 months ago Type 2 diabetes mellitus with other specified complication, without long-term current use of insulin [facility])   [place] [facility], Netta Neat, DO   8 months ago Annual physical exam   [place] [facility] Smitty Cords, DO   1 year ago Chronic myofascial pain   [place] [facility] [place], Netta Neat, DO   1 year ago Type 2 diabetes mellitus with other specified complication, without long-term current use of insulin [facility])   [place] [facility] Smitty Cords, DO   1 year ago Annual physical exam   [place] [facility] Smitty Cords, DO       Future Appointments             In 3 months Althea Charon, Netta Neat, DO [place] [facility], [facility]   In 4 months Debbe Odea, MD [facility] at [facility]

## 2023-02-20 ENCOUNTER — Other Ambulatory Visit: Payer: Self-pay | Admitting: Family Medicine

## 2023-02-20 DIAGNOSIS — M62830 Muscle spasm of back: Secondary | ICD-10-CM

## 2023-02-21 NOTE — Telephone Encounter (Signed)
Requested medication (s) are due for refill today: yes  Requested medication (s) are on the active medication list: yes  Last refill:  10/07/22  Future visit scheduled: yes  Notes to clinic:  Unable to refill per protocol, cannot delegate.      Requested Prescriptions  Pending Prescriptions Disp Refills   cyclobenzaprine (FLEXERIL) 10 MG tablet [Pharmacy Med Name: Cyclobenzaprine HCl 10 MG Oral Tablet] 90 tablet 0    Sig: TAKE 1/2 TO 1 (ONE-HALF TO ONE) TABLET BY MOUTH THREE TIMES DAILY AS NEEDED FOR MUSCLE SPASM     Not Delegated - Analgesics:  Muscle Relaxants Failed - 02/20/2023  7:29 PM      Failed - This refill cannot be delegated      Passed - Valid encounter within last 6 months    Recent Outpatient Visits           4 months ago Type 2 diabetes mellitus with other specified complication, without long-term current use of insulin [facility])   [place] [facility] [place], Netta Neat, DO   9 months ago Annual physical exam   [place] [facility] Smitty Cords, DO   1 year ago Chronic myofascial pain   [place] [facility], Netta Neat, DO   1 year ago Type 2 diabetes mellitus with other specified complication, without long-term current use of insulin [facility])   [place] [facility] Smitty Cords, DO   1 year ago Annual physical exam   [place] [facility] Smitty Cords, DO       Future Appointments             In 2 months Althea Charon, Netta Neat, DO [place] [facility], [facility]   In 3 months Debbe Odea, MD [facility] at [facility]

## 2023-03-13 ENCOUNTER — Telehealth: Payer: Self-pay | Admitting: Cardiology

## 2023-03-13 ENCOUNTER — Other Ambulatory Visit (HOSPITAL_COMMUNITY): Payer: Self-pay

## 2023-03-13 ENCOUNTER — Telehealth: Payer: Self-pay

## 2023-03-13 MED ORDER — CLOPIDOGREL BISULFATE 75 MG PO TABS: 75.0000 mg | ORAL_TABLET | Freq: Every day | ORAL | 3 refills | Status: DC

## 2023-03-13 NOTE — Telephone Encounter (Signed)
*  STAT* If patient is at the pharmacy, call can be transferred to refill team.   1. Which medications need to be refilled? (please list name of each medication and dose if known) clopidogrel (PLAVIX) 75 MG tablet   2. Would you like to learn more about the convenience, safety, & potential cost savings by using the [facility]? No   3. Are you open to using the [facility] No   4. Which pharmacy/location (including street and city if local pharmacy) is medication to be sent to? [facility] - [place] (N), [place] - [address]   5. Do they need a 30 day or 90 day supply? 90 Day Supply

## 2023-03-13 NOTE — Telephone Encounter (Signed)
Pharmacy Patient Advocate Encounter   Received notification from CoverMyMeds that prior authorization for RANOLAZINE 500 MG ER TAB is required/requested.   Insurance verification completed.   The patient is insured through [facility] .   Per test claim: PA required; PA submitted to [facility] via CoverMyMeds Key/confirmation #/EOC [id number] Status is pending

## 2023-03-14 ENCOUNTER — Other Ambulatory Visit (HOSPITAL_COMMUNITY): Payer: Self-pay

## 2023-03-14 NOTE — Telephone Encounter (Signed)
Pharmacy Patient Advocate Encounter  Received notification from [facility] that Prior Authorization for RANOLAZINE has been APPROVED from 03/12/23 to 03/11/24. Ran test claim, Copay is $13.92. This test claim was processed through [facility]- copay amounts may vary at other pharmacies due to pharmacy/plan contracts, or as the patient moves through the different stages of their insurance plan.

## 2023-04-08 ENCOUNTER — Other Ambulatory Visit: Payer: Self-pay | Admitting: Family Medicine

## 2023-04-08 DIAGNOSIS — F331 Major depressive disorder, recurrent, moderate: Secondary | ICD-10-CM

## 2023-04-08 NOTE — Telephone Encounter (Signed)
Requested Prescriptions  Pending Prescriptions Disp Refills   DULoxetine (CYMBALTA) 60 MG capsule [Pharmacy Med Name: DULoxetine HCl 60 MG Oral Capsule Delayed Release Particles] 30 capsule 2    Sig: Take 1 capsule by mouth once daily     Psychiatry: Antidepressants - SNRI - duloxetine Failed - 04/08/2023  6:50 AM      Failed - Valid encounter within last 6 months    Recent Outpatient Visits           6 months ago Type 2 diabetes mellitus with other specified complication, without long-term current use of insulin [facility])   [place] [facility] [place], Netta Neat, DO   11 months ago Annual physical exam   [place] [facility] Smitty Cords, DO   1 year ago Chronic myofascial pain   [place] [facility] [place], Netta Neat, DO   1 year ago Type 2 diabetes mellitus with other specified complication, without long-term current use of insulin [facility])   [place] [facility] [place], Netta Neat, DO   1 year ago Annual physical exam   [place] [facility], Netta Neat, DO       Future Appointments             In 1 month Althea Charon, Netta Neat, DO [place] [facility], [facility]   In 1 month Debbe Odea, MD [facility] at [facility] - Cr in normal range and within 360 days    Creat  Date Value Ref Range Status  04/26/2022 0.81 0.70 - 1.35 mg/dL Final   Creatinine, Urine  Date Value Ref Range Status  10/07/2022 80 20 - 320 mg/dL Final         Passed - eGFR is 30 or above and within 360 days    GFR, Est African American  Date Value Ref Range Status  04/18/2020 113 > OR = 60 mL/min/1.23m2 Final   GFR calc Af Amer  Date Value Ref Range Status  05/22/2020 118 >59 mL/min/1.73 Final    Comment:    **In accordance with recommendations from the NKF-ASN Task force,**   [facility] is in the process of updating  its eGFR calculation to the   2021 CKD-EPI creatinine equation that estimates kidney function   without a race variable.    GFR, Est Non African American  Date Value Ref Range Status  04/18/2020 97 > OR = 60 mL/min/1.40m2 Final   GFR, Estimated  Date Value Ref Range Status  06/20/2020 >60 >60 mL/min Final    Comment:    (NOTE) Calculated using the CKD-EPI Creatinine Equation (2021)    eGFR  Date Value Ref Range Status  04/26/2022 100 > OR = 60 mL/min/1.35m2 Final         Passed - Completed PHQ-2 or PHQ-9 in the last 360 days      Passed - Last BP in normal range    BP Readings from Last 1 Encounters:  11/27/22 132/74

## 2023-04-14 ENCOUNTER — Other Ambulatory Visit: Payer: Self-pay

## 2023-04-14 MED ORDER — RANOLAZINE ER 500 MG PO TB12: 500.0000 mg | ORAL_TABLET | Freq: Two times a day (BID) | ORAL | 0 refills | Status: DC

## 2023-04-14 NOTE — Telephone Encounter (Signed)
Requested Prescriptions   Signed Prescriptions Disp Refills   ranolazine (RANEXA) 500 MG 12 hr tablet 180 tablet 0    Sig: Take 1 tablet (500 mg total) by mouth 2 (two) times daily.    Authorizing Provider: Debbe Odea    Ordering User: Guerry Minors

## 2023-04-22 ENCOUNTER — Other Ambulatory Visit: Payer: Self-pay | Admitting: Family Medicine

## 2023-04-22 DIAGNOSIS — G8929 Other chronic pain: Secondary | ICD-10-CM

## 2023-04-22 DIAGNOSIS — G894 Chronic pain syndrome: Secondary | ICD-10-CM

## 2023-04-23 NOTE — Telephone Encounter (Signed)
Requested Prescriptions  Pending Prescriptions Disp Refills   gabapentin (NEURONTIN) 600 MG tablet [Pharmacy Med Name: Gabapentin 600 MG Oral Tablet] 90 tablet 0    Sig: TAKE 1 TABLET BY MOUTH AT BEDTIME     Neurology: Anticonvulsants - gabapentin Failed - 04/22/2023  9:14 AM      Failed - Cr in normal range and within 360 days    Creat  Date Value Ref Range Status  04/26/2022 0.81 0.70 - 1.35 mg/dL Final   Creatinine, Urine  Date Value Ref Range Status  10/07/2022 80 20 - 320 mg/dL Final         Passed - Completed PHQ-2 or PHQ-9 in the last 360 days      Passed - Valid encounter within last 12 months    Recent Outpatient Visits           6 months ago Type 2 diabetes mellitus with other specified complication, without long-term current use of insulin [facility])   [place] [facility] Smitty Cords, DO   11 months ago Annual physical exam   [place] [facility] Smit[facility] C[facility], DO   1 year ago Chronic myofascial pain   [place] [facility] [place], Netta Neat, DO   1 year ago Type 2 diabetes mellitus with other specified complication, without long-term current use of insulin [facility])   [place] [facility] Smitty Cords, DO   2 years ago Annual physical exam   [place] [facility] Smitty Cords, DO       Future Appointments             In 3 weeks Althea Charon, Netta Neat, DO [place] [facility], [facility]   In 1 month Debbe Odea, MD [facility] at [facility]

## 2023-05-09 ENCOUNTER — Other Ambulatory Visit: Payer: 59

## 2023-05-11 ENCOUNTER — Other Ambulatory Visit: Payer: Self-pay | Admitting: Family Medicine

## 2023-05-11 DIAGNOSIS — F5104 Psychophysiologic insomnia: Secondary | ICD-10-CM

## 2023-05-11 DIAGNOSIS — F331 Major depressive disorder, recurrent, moderate: Secondary | ICD-10-CM

## 2023-05-13 NOTE — Telephone Encounter (Signed)
30 day courtesy supply given to last until appt on 05/23/2023.   Meets criteria except for OV.  Requested Prescriptions  Pending Prescriptions Disp Refills   traZODone (DESYREL) 100 MG tablet [Pharmacy Med Name: traZODone HCl 100 MG Oral Tablet] 30 tablet 0    Sig: TAKE 1 TABLET BY MOUTH AT BEDTIME     Psychiatry: Antidepressants - Serotonin Modulator Failed - 05/11/2023  7:35 PM      Failed - Valid encounter within last 6 months    Recent Outpatient Visits           7 months ago Type 2 diabetes mellitus with other specified complication, without long-term current use of insulin [facility])   [place] [facility] Althea Charon, Netta Neat, DO   1 year ago Annual physical exam   [place] [facility] Smitty Cords, DO   1 year ago Chronic myofascial pain   [place] [facility] [place], Netta Neat, DO   1 year ago Type 2 diabetes mellitus with other specified complication, without long-term current use of insulin [facility])   [place] [facility] S[facility] Cords, DO   2 years ago Annual physical exam   [place] [facility] Smitty Cords, DO       Future Appointments             In 1 week Althea Charon, Netta Neat, DO [place] [facility], [facility]   In 2 weeks Debbe Odea, MD [facility] at [facility] - Completed PHQ-2 or PHQ-9 in the last 360 days

## 2023-05-15 ENCOUNTER — Telehealth: Payer: Self-pay | Admitting: Cardiology

## 2023-05-15 MED ORDER — CLOPIDOGREL BISULFATE 75 MG PO TABS: 75.0000 mg | ORAL_TABLET | Freq: Every day | ORAL | 0 refills | Status: DC

## 2023-05-15 NOTE — Telephone Encounter (Signed)
Requested Prescriptions   Signed Prescriptions Disp Refills   clopidogrel (PLAVIX) 75 MG tablet 90 tablet 0    Sig: Take 1 tablet (75 mg total) by mouth daily.    Authorizing Provider: Debbe Odea    Ordering User: Kendrick Fries

## 2023-05-15 NOTE — Telephone Encounter (Signed)
 *  STAT* If patient is at the pharmacy, call can be transferred to refill team.   1. Which medications need to be refilled? (please list name of each medication and dose if known)   clopidogrel (PLAVIX) 75 MG tablet     2. Would you like to learn more about the convenience, safety, & potential cost savings by using the [facility]?    3. Are you open to using the [facility] (Type [facility].    4. Which pharmacy/location (including street and city if local pharmacy) is medication to be sent to?  [facility] - [place] (N), [place] - [address]     5. Do they need a 30 day or 90 day supply? 90 days

## 2023-05-16 ENCOUNTER — Encounter: Payer: 59 | Admitting: Family Medicine

## 2023-05-23 ENCOUNTER — Other Ambulatory Visit: Payer: 59

## 2023-05-23 DIAGNOSIS — E785 Hyperlipidemia, unspecified: Secondary | ICD-10-CM | POA: Diagnosis not present

## 2023-05-23 DIAGNOSIS — R351 Nocturia: Secondary | ICD-10-CM

## 2023-05-23 DIAGNOSIS — E1169 Type 2 diabetes mellitus with other specified complication: Secondary | ICD-10-CM

## 2023-05-23 DIAGNOSIS — I10 Essential (primary) hypertension: Secondary | ICD-10-CM

## 2023-05-23 DIAGNOSIS — I2510 Atherosclerotic heart disease of native coronary artery without angina pectoris: Secondary | ICD-10-CM

## 2023-05-23 DIAGNOSIS — Z0000 Encounter for general adult medical examination without abnormal findings: Secondary | ICD-10-CM | POA: Diagnosis not present

## 2023-05-24 LAB — CBC WITH DIFFERENTIAL/PLATELET
Absolute Lymphocytes: 3104 {cells}/uL (ref 850–3900)
Absolute Monocytes: 592 {cells}/uL (ref 200–950)
Basophils Absolute: 87 {cells}/uL (ref 0–200)
Basophils Relative: 0.9 %
Eosinophils Absolute: 252 {cells}/uL (ref 15–500)
Eosinophils Relative: 2.6 %
HCT: 44.5 % (ref 38.5–50.0)
Hemoglobin: 15 g/dL (ref 13.2–17.1)
MCH: 30.9 pg (ref 27.0–33.0)
MCHC: 33.7 g/dL (ref 32.0–36.0)
MCV: 91.6 fL (ref 80.0–100.0)
MPV: 9.3 fL (ref 7.5–12.5)
Monocytes Relative: 6.1 %
Neutro Abs: 5665 {cells}/uL (ref 1500–7800)
Neutrophils Relative %: 58.4 %
Platelets: 296 10*3/uL (ref 140–400)
RBC: 4.86 10*6/uL (ref 4.20–5.80)
RDW: 11.9 % (ref 11.0–15.0)
Total Lymphocyte: 32 %
WBC: 9.7 10*3/uL (ref 3.8–10.8)

## 2023-05-24 LAB — LIPID PANEL
Cholesterol: 101 mg/dL (ref <200)
HDL: 33 mg/dL — ABNORMAL LOW (ref >=40)
LDL Cholesterol (Calc): 42 mg/dL
Non-HDL Cholesterol (Calc): 68 mg/dL (ref <130)
Total CHOL/HDL Ratio: 3.1 (calc) (ref <5.0)
Triglycerides: 187 mg/dL — ABNORMAL HIGH (ref <150)

## 2023-05-24 LAB — HEMOGLOBIN A1C
Hgb A1c MFr Bld: 10.7 %{Hb} — ABNORMAL HIGH (ref <5.7)
Mean Plasma Glucose: 260 mg/dL
eAG (mmol/L): 14.4 mmol/L

## 2023-05-24 LAB — COMPLETE METABOLIC PANEL WITH GFR
AG Ratio: 1.7 (calc) (ref 1.0–2.5)
ALT: 27 U/L (ref 9–46)
AST: 22 U/L (ref 10–35)
Albumin: 4.5 g/dL (ref 3.6–5.1)
Alkaline phosphatase (APISO): 64 U/L (ref 35–144)
BUN: 13 mg/dL (ref 7–25)
CO2: 27 mmol/L (ref 20–32)
Calcium: 9.9 mg/dL (ref 8.6–10.3)
Chloride: 96 mmol/L — ABNORMAL LOW (ref 98–110)
Creat: 0.95 mg/dL (ref 0.70–1.35)
Globulin: 2.6 g/dL (ref 1.9–3.7)
Glucose, Bld: 245 mg/dL — ABNORMAL HIGH (ref 65–99)
Potassium: 4.8 mmol/L (ref 3.5–5.3)
Sodium: 135 mmol/L (ref 135–146)
Total Bilirubin: 0.6 mg/dL (ref 0.2–1.2)
Total Protein: 7.1 g/dL (ref 6.1–8.1)
eGFR: 90 mL/min/{1.73_m2} (ref >=60)

## 2023-05-24 LAB — PSA: PSA: 0.67 ng/mL (ref <=4.00)

## 2023-05-24 LAB — TSH: TSH: 1.05 m[IU]/L (ref 0.40–4.50)

## 2023-05-26 ENCOUNTER — Ambulatory Visit (INDEPENDENT_AMBULATORY_CARE_PROVIDER_SITE_OTHER): Payer: 59 | Admitting: Family Medicine

## 2023-05-26 ENCOUNTER — Encounter: Payer: Self-pay | Admitting: Family Medicine

## 2023-05-26 VITALS — BP 128/74 | Ht 69.0 in | Wt 176.0 lb

## 2023-05-26 DIAGNOSIS — Z720 Tobacco use: Secondary | ICD-10-CM

## 2023-05-26 DIAGNOSIS — E113299 Type 2 diabetes mellitus with mild nonproliferative diabetic retinopathy without macular edema, unspecified eye: Secondary | ICD-10-CM | POA: Diagnosis not present

## 2023-05-26 DIAGNOSIS — F331 Major depressive disorder, recurrent, moderate: Secondary | ICD-10-CM

## 2023-05-26 DIAGNOSIS — G894 Chronic pain syndrome: Secondary | ICD-10-CM

## 2023-05-26 DIAGNOSIS — I10 Essential (primary) hypertension: Secondary | ICD-10-CM

## 2023-05-26 DIAGNOSIS — M7918 Myalgia, other site: Secondary | ICD-10-CM | POA: Diagnosis not present

## 2023-05-26 DIAGNOSIS — J449 Chronic obstructive pulmonary disease, unspecified: Secondary | ICD-10-CM

## 2023-05-26 DIAGNOSIS — R195 Other fecal abnormalities: Secondary | ICD-10-CM | POA: Diagnosis not present

## 2023-05-26 DIAGNOSIS — E1169 Type 2 diabetes mellitus with other specified complication: Secondary | ICD-10-CM | POA: Diagnosis not present

## 2023-05-26 DIAGNOSIS — Z0000 Encounter for general adult medical examination without abnormal findings: Secondary | ICD-10-CM | POA: Diagnosis not present

## 2023-05-26 DIAGNOSIS — F419 Anxiety disorder, unspecified: Secondary | ICD-10-CM

## 2023-05-26 DIAGNOSIS — E785 Hyperlipidemia, unspecified: Secondary | ICD-10-CM

## 2023-05-26 MED ORDER — TIZANIDINE HCL 4 MG PO TABS: 4.0000 mg | ORAL_TABLET | Freq: Three times a day (TID) | ORAL | 5 refills | Status: AC | PRN

## 2023-05-26 MED ORDER — JARDIANCE 25 MG PO TABS: 25.0000 mg | ORAL_TABLET | Freq: Every day | ORAL | 2 refills | Status: DC

## 2023-05-26 NOTE — Progress Notes (Signed)
Subjective:    Patient ID: Clifford Morse, male    DOB: [birth year], 62 y.o.   MRN: [id number]  Clifford Morse is a 62 y.o. male presenting on 05/26/2023 for Annual Exam and Back Pain (X 3 weeks )   HPI  Discussed the use of AI scribe software for clinical note transcription with the patient, who gave verbal consent to proceed.  The patient, with a history of diabetes, presented for a routine follow-up visit. He reported struggling with blood sugar control, despite adherence to metformin therapy. The patient acknowledged dietary indiscretions as a potential contributing factor to his elevated blood glucose levels. He expressed a desire to manage his diabetes without making drastic dietary changes.  The patient also reported a recurrent issue with back pain, localized in the middle of the spine. The pain was described as being aggravated by movement, particularly after periods of sitting, and seemed to improve with rest. The patient noted that the pain had been ongoing for about three weeks and was in the same location as a previous episode. He had been taking Flexeril nightly for this issue, but reported no significant relief.  In addition to these concerns, the patient was due for colon cancer screening. He had previously completed a Cologuard test in 2021, but a subsequent colonoscopy had been delayed due to cardiac concerns. The patient expressed readiness to pursue the colonoscopy in the new year.  The patient also reported a history of smoking and was offered a lung cancer screening test, which he accepted. He had recently undergone an eye exam at an external facility, the results of which were not available at the time of the visit.  The patient's blood work results were reviewed during the visit. Kidney and liver function, blood counts, prostate levels, and thyroid function were all reported as normal. Cholesterol levels were satisfactory, with an LDL cholesterol of 42 and triglycerides in  the 180s. However, the patient's blood glucose level was elevated at 10.7, indicating poor diabetes control.     Chronic pain syndrome Chronic Myofascial pain R Hip Pain Low Back Pain  Tried Gabapentin 600mg  nightly, Flexeril 10mg  TID PRN, Duloxetine 60mg   Chronic problem with R hip pain  Chronic Hip / Knee pain Followed now by [facility] for pain management Mid back pain, chronic similar to last time, prolong sitting, and then goes to move active lifting turning twisting etc   CHRONIC HTN: Reports home BP normal range 120s Current Meds - Carvedilol 25mg  BID, Lisinopril-HCTZ 10-12.5mg    Reports good compliance, took meds today. Tolerating well, w/o complaints. Denies CP, dyspnea, HA, edema, dizziness / lightheadedness   DM, Type 2 / Possible neuropathy complication Last A1c 10.7 elevated CBGs: Results 120-140 avg Meds: Metformin XR 750mg  TWICE a day with meal Currently on ACEi already Lifestyle: - Diet (still improving low carb options) Request copy of last Diabetic Eye Exam Summer 2024 History of occasional foot stinging symptoms Denies hypoglycemia, polyuria, visual changes, numbness or tingling.  HYPERLIPIDEMIA: - Reports concerns. Last lipid panel with elevated TG, cardiology raised his Statin therapy from 20 to 40mg  dose Rosuvastatin - Currently taking Rosuvastatin 40mg , tolerating well without side effects or myalgias   Chronic Pain Syndrome Chronic Hip / Knee pain     CAD S/p Left Heart Cath by Dr Kirke Corin 06/19/20 He had another cardiac stent placed   Centrilobular Emphysema / COPD Followed by [facility] Dr Jayme Cloud Previously on Trelegy and then switched to trial on Breztri and did not  gain benefit Lowest cost on Trelegy was $200 with financial assistance, he could not afford this    Health Maintenance:  Referral to GI for coloscopy now 2+ years after prior abnormal cologuard  PSA 0.67 (negative)     05/26/2023    4:05 PM 10/07/2022    3:57 PM  05/03/2022    8:55 AM  Depression screen PHQ 2/9  Decreased Interest 2 3 3   Down, Depressed, Hopeless 2 1 1   PHQ - 2 Score 4 4 4   Altered sleeping 1 2 2   Tired, decreased energy 3 3 3   Change in appetite 3 1 2   Feeling bad or failure about yourself  1 0 1  Trouble concentrating 3 0 3  Moving slowly or fidgety/restless 0 0 0  Suicidal thoughts 1 2 1   PHQ-9 Score 16 12 16   Difficult doing work/chores Somewhat difficult Not difficult at all Very difficult       05/26/2023    4:05 PM 10/07/2022    3:57 PM 05/03/2022    8:56 AM 11/30/2021   11:42 AM  GAD 7 : Generalized Anxiety Score  Nervous, Anxious, on Edge 1 1 1 1   Control/stop worrying 1 1 1 1   Worry too much - different things 1 1 1 1   Trouble relaxing 3 1 1 3   Restless 1 1 1 3   Easily annoyed or irritable 1 1 1 1   Afraid - awful might happen 0 1 1 1   Total GAD 7 Score 8 7 7 11   Anxiety Difficulty Somewhat difficult Not difficult at all Somewhat difficult Somewhat difficult     Past Medical History:  Diagnosis Date   Allergy    Arthritis    Asthma    Chest pain on exertion    Diabetes mellitus, type 2 (HCC)    GERD (gastroesophageal reflux disease)    Hyperlipidemia    Vertigo    with sinus issues   Past Surgical History:  Procedure Laterality Date   CARDIAC CATHETERIZATION  2014   stent   CORONARY STENT INTERVENTION  2014   CORONARY STENT INTERVENTION N/A 06/19/2020   Procedure: CORONARY STENT INTERVENTION;  Surgeon: Iran Ouch, MD;  Location: [facility] INVASIVE CV LAB;  Service: Cardiovascular;  Laterality: N/A;   ESOPHAGOGASTRODUODENOSCOPY     HERNIA REPAIR     LEFT HEART CATH AND CORONARY ANGIOGRAPHY Left 06/19/2020   Procedure: LEFT HEART CATH AND CORONARY ANGIOGRAPHY;  Surgeon: Iran Ouch, MD;  Location: [facility] INVASIVE CV LAB;  Service: Cardiovascular;  Laterality: Left;   Social History   Socioeconomic History   Marital status: Married    Spouse name: Marylu Lund    Number of children: 2   Years  of education: Not on file   Highest education level: Not on file  Occupational History   Not on file  Tobacco Use   Smoking status: Every Day    Current packs/day: 2.00    Average packs/day: 2.0 packs/day for 40.0 years (80.0 ttl pk-yrs)    Types: Cigarettes   Smokeless tobacco: Former   Tobacco comments:    1PPD   Vaping Use   Vaping status: Never Used  Substance and Sexual Activity   Alcohol use: Yes    Alcohol/week: 0.0 standard drinks of alcohol    Comment: rare   Drug use: No   Sexual activity: Not on file  Other Topics Concern   Not on file  Social History Narrative   Lives at home with wife   Social  Determinants of Health   Financial Resource Strain: Medium Risk (05/26/2023)   Overall Financial Resource Strain (CARDIA)    Difficulty of Paying Living Expenses: Somewhat hard  Food Insecurity: No Food Insecurity (04/03/2022)   Hunger Vital Sign    Worried About Running Out of Food in the Last Year: Never true    Ran Out of Food in the Last Year: Never true  Transportation Needs: No Transportation Needs (04/03/2022)   PRAPARE - Administrator, Civil Service (Medical): No    Lack of Transportation (Non-Medical): No  Physical Activity: Unknown (05/26/2023)   Exercise Vital Sign    Days of Exercise per Week: 0 days    Minutes of Exercise per Session: Not on file  Stress: No Stress Concern Present (05/26/2023)   [employer] of Occupational Health - Occupational Stress Questionnaire    Feeling of Stress : Only a little  Social Connections: Socially Isolated (05/26/2023)   Social Connection and Isolation Panel [NHANES]    Frequency of Communication with Friends and Family: Once a week    Frequency of Social Gatherings with Friends and Family: Once a week    Attends Religious Services: Never    Database administrator or Organizations: No    Attends Banker Meetings: Never    Marital Status: Married  Catering manager Violence: Not At Risk  (05/26/2023)   Humiliation, Afraid, Rape, and Kick questionnaire    Fear of Current or Ex-Partner: No    Emotionally Abused: No    Physically Abused: No    Sexually Abused: No   Family History  Problem Relation Age of Onset   Emphysema Father    Diabetes Mother    Current Outpatient Medications on File Prior to Visit  Medication Sig   aspirin 81 MG tablet Take 81 mg by mouth daily.   clopidogrel (PLAVIX) 75 MG tablet Take 1 tablet (75 mg total) by mouth daily.   DULoxetine (CYMBALTA) 60 MG capsule Take 1 capsule by mouth once daily   fenofibrate 160 MG tablet Take 1 tablet (160 mg total) by mouth daily.   gabapentin (NEURONTIN) 600 MG tablet TAKE 1 TABLET BY MOUTH AT BEDTIME   icosapent Ethyl (VASCEPA) 1 g capsule Take 2 capsules by mouth twice daily   lisinopril-hydrochlorothiazide (ZESTORETIC) 10-12.5 MG tablet Take 1 tablet by mouth once daily   metFORMIN (GLUCOPHAGE-XR) 750 MG 24 hr tablet TAKE 1 TABLET BY MOUTH TWICE DAILY WITH A MEAL   pantoprazole (PROTONIX) 40 MG tablet Take 1 tablet (40 mg total) by mouth daily before breakfast.   pseudoephedrine-acetaminophen (TYLENOL SINUS) 30-500 MG TABS tablet Take 2 tablets by mouth 2 (two) times daily as needed (congestion).   ranolazine (RANEXA) 500 MG 12 hr tablet Take 1 tablet (500 mg total) by mouth 2 (two) times daily.   rosuvastatin (CRESTOR) 40 MG tablet Take 1 tablet (40 mg total) by mouth daily.   tadalafil (CIALIS) 20 MG tablet Take 1 tablet (20 mg total) by mouth every other day as needed for erectile dysfunction.   traZODone (DESYREL) 100 MG tablet TAKE 1 TABLET BY MOUTH AT BEDTIME   carvedilol (COREG) 25 MG tablet Take 1 tablet (25 mg total) by mouth 2 (two) times daily with a meal.   fluticasone (FLONASE) 50 MCG/ACT nasal spray Place 1 spray into both nostrils daily as needed for allergies or rhinitis. (Patient not taking: Reported on 05/26/2023)   No current facility-administered medications on file prior to visit.  Review of Systems  Constitutional:  Negative for activity change, appetite change, chills, diaphoresis, fatigue and fever.  HENT:  Negative for congestion and hearing loss.   Eyes:  Negative for visual disturbance.  Respiratory:  Negative for cough, chest tightness, shortness of breath and wheezing.   Cardiovascular:  Negative for chest pain, palpitations and leg swelling.  Gastrointestinal:  Negative for abdominal pain, constipation, diarrhea, nausea and vomiting.  Genitourinary:  Negative for dysuria, frequency and hematuria.  Musculoskeletal:  Negative for arthralgias and neck pain.  Skin:  Negative for rash.  Neurological:  Negative for dizziness, weakness, light-headedness, numbness and headaches.  Hematological:  Negative for adenopathy.  Psychiatric/Behavioral:  Negative for behavioral problems, dysphoric mood and sleep disturbance.    Per HPI unless specifically indicated above     Objective:    BP 128/74   Ht 5\' 9"  (1.753 m)   Wt 176 lb (79.8 kg)   BMI 25.99 kg/m   Wt Readings from Last 3 Encounters:  05/26/23 176 lb (79.8 kg)  11/27/22 182 lb 12.8 oz (82.9 kg)  10/07/22 179 lb 12.8 oz (81.6 kg)    Physical Exam Vitals and nursing note reviewed.  Constitutional:      General: He is not in acute distress.    Appearance: He is well-developed. He is not diaphoretic.     Comments: Well-appearing, comfortable, cooperative  HENT:     Head: Normocephalic and atraumatic.  Eyes:     General:        Right eye: No discharge.        Left eye: No discharge.     Conjunctiva/sclera: Conjunctivae normal.     Pupils: Pupils are equal, round, and reactive to light.  Neck:     Thyroid: No thyromegaly.  Cardiovascular:     Rate and Rhythm: Normal rate and regular rhythm.     Pulses: Normal pulses.     Heart sounds: Normal heart sounds. No murmur heard. Pulmonary:     Effort: Pulmonary effort is normal. No respiratory distress.     Breath sounds: Normal breath sounds. No  wheezing or rales.  Abdominal:     General: Bowel sounds are normal. There is no distension.     Palpations: Abdomen is soft. There is no mass.     Tenderness: There is no abdominal tenderness.  Musculoskeletal:        General: No tenderness. Normal range of motion.     Cervical back: Normal range of motion and neck supple.     Comments: Upper / Lower Extremities: - Normal muscle tone, strength bilateral upper extremities 5/5, lower extremities 5/5  Lymphadenopathy:     Cervical: No cervical adenopathy.  Skin:    General: Skin is warm and dry.     Findings: No erythema or rash.  Neurological:     Mental Status: He is alert and oriented to person, place, and time.     Comments: Distal sensation intact to light touch all extremities  Psychiatric:        Mood and Affect: Mood normal.        Behavior: Behavior normal.        Thought Content: Thought content normal.     Comments: Well groomed, good eye contact, normal speech and thoughts     Results for orders placed or performed in visit on 05/23/23  TSH  Result Value Ref Range   TSH 1.05 0.40 - 4.50 mIU/L  PSA  Result Value Ref Range   PSA  0.67 < OR = 4.00 ng/mL  Lipid panel  Result Value Ref Range   Cholesterol 101 <200 mg/dL   HDL 33 (L) > OR = 40 mg/dL   Triglycerides 161 (H) <150 mg/dL   LDL Cholesterol (Calc) 42 mg/dL (calc)   Total CHOL/HDL Ratio 3.1 <5.0 (calc)   Non-HDL Cholesterol (Calc) 68 <096 mg/dL (calc)  Hemoglobin E4V  Result Value Ref Range   Hgb A1c MFr Bld 10.7 (H) <5.7 % of total Hgb   Mean Plasma Glucose 260 mg/dL   eAG (mmol/L) 40.9 mmol/L  CBC with Differential/Platelet  Result Value Ref Range   WBC 9.7 3.8 - 10.8 Thousand/uL   RBC 4.86 4.20 - 5.80 Million/uL   Hemoglobin 15.0 13.2 - 17.1 g/dL   HCT 81.1 91.4 - 78.2 %   MCV 91.6 80.0 - 100.0 fL   MCH 30.9 27.0 - 33.0 pg   MCHC 33.7 32.0 - 36.0 g/dL   RDW 95.6 21.3 - 08.6 %   Platelets 296 140 - 400 Thousand/uL   MPV 9.3 7.5 - 12.5 fL    Neutro Abs 5,665 1,500 - 7,800 cells/uL   Absolute Lymphocytes 3,104 850 - 3,900 cells/uL   Absolute Monocytes 592 200 - 950 cells/uL   Eosinophils Absolute 252 15 - 500 cells/uL   Basophils Absolute 87 0 - 200 cells/uL   Neutrophils Relative % 58.4 %   Total Lymphocyte 32.0 %   Monocytes Relative 6.1 %   Eosinophils Relative 2.6 %   Basophils Relative 0.9 %  COMPLETE METABOLIC PANEL WITH GFR  Result Value Ref Range   Glucose, Bld 245 (H) 65 - 99 mg/dL   BUN 13 7 - 25 mg/dL   Creat 5.78 4.69 - 6.29 mg/dL   eGFR 90 > OR = 60 BM/WUX/3.24M0   BUN/Creatinine Ratio SEE NOTE: 6 - 22 (calc)   Sodium 135 135 - 146 mmol/L   Potassium 4.8 3.5 - 5.3 mmol/L   Chloride 96 (L) 98 - 110 mmol/L   CO2 27 20 - 32 mmol/L   Calcium 9.9 8.6 - 10.3 mg/dL   Total Protein 7.1 6.1 - 8.1 g/dL   Albumin 4.5 3.6 - 5.1 g/dL   Globulin 2.6 1.9 - 3.7 g/dL (calc)   AG Ratio 1.7 1.0 - 2.5 (calc)   Total Bilirubin 0.6 0.2 - 1.2 mg/dL   Alkaline phosphatase (APISO) 64 35 - 144 U/L   AST 22 10 - 35 U/L   ALT 27 9 - 46 U/L      Assessment & Plan:   Problem List Items Addressed This Visit     Anxiety   Background diabetic retinopathy associated with type 2 diabetes mellitus (HCC)   Relevant Medications   JARDIANCE 25 MG TABS tablet   Chronic myofascial pain   Relevant Medications   tiZANidine (ZANAFLEX) 4 MG tablet   Chronic pain syndrome   Relevant Medications   tiZANidine (ZANAFLEX) 4 MG tablet   Essential hypertension   Hyperlipidemia associated with type 2 diabetes mellitus (HCC)   Relevant Medications   JARDIANCE 25 MG TABS tablet   Major depressive disorder, recurrent, moderate (HCC)   Stage 1 mild COPD by GOLD classification (HCC)   Relevant Orders   Ambulatory Referral Lung Cancer Screening [facility]   Tobacco abuse   Relevant Orders   Ambulatory Referral Lung Cancer Screening [facility]   Type 2 diabetes mellitus with other specified complication (HCC)   Relevant  Medications   JARDIANCE 25 MG TABS  tablet   Other Visit Diagnoses     Annual physical exam    -  Primary   Positive colorectal cancer screening using Cologuard test       Relevant Orders   Ambulatory referral to Gastroenterology        Updated Health Maintenance information Reviewed recent lab results with patient Encouraged improvement to lifestyle with diet and exercise Goal of weight loss  Colon Cancer Screening Positive Cologuard in 2021, colonoscopy was deferred due to cardiac concerns. Patient is now ready to pursue colonoscopy. -Referral to GI for colonoscopy, to be scheduled after the new year.  Diabetes Mellitus Poorly controlled with HbA1c of 10.7 despite Metformin therapy. Patient has difficulty with dietary control. -Add Jardiance 25mg  daily to Metformin therapy. -Check insurance approval for Jardiance. -Follow-up in 4 months to assess efficacy of new medication.  Lung Cancer Screening Patient with smoking history is eligible for annual lung cancer screening. -Order low-dose CT lung cancer screening.  Musculoskeletal Back Pain Chronic back pain, likely secondary to arthritis, not well controlled with Flexeril. -Discontinue Flexeril. -Start Tizanidine as needed for muscle and back pain.  General Health Maintenance -Obtain diabetic eye exam results requested Prudencio Burly -Schedule follow-up appointment in late March 2025.      ____________________________________________________ Additional Rx Information (May be used for Prior Authorization if required)  Medication name and Strength: Jardiance 25mg   Primary Diagnosis and ICD10 Code: Type 2 Diabetes with other specified condition (E11.69) Secondary Diagnosis and ICD10 Code: n/a  Previous Failed Medications Metformin IR Metformin XR 500mg , 750mg  Quantity and Duration of New Medication: 30 pills for 30 days Additional Supporting Information: Note his insurance requires Step Therapy. He has to have  tried Metformin first, but this option is preferred now. ____________________________________________________     Orders Placed This Encounter  Procedures   Ambulatory referral to Gastroenterology    Referral Priority:   Routine    Referral Type:   Consultation    Referral Reason:   Specialty Services Required    Number of Visits Requested:   1   Ambulatory Referral Lung Cancer Screening [facility]    Referral Priority:   Routine    Referral Type:   Consultation    Referral Reason:   Specialty Services Required    Number of Visits Requested:   1    Meds ordered this encounter  Medications   JARDIANCE 25 MG TABS tablet    Sig: Take 1 tablet (25 mg total) by mouth daily before breakfast.    Dispense:  30 tablet    Refill:  2   tiZANidine (ZANAFLEX) 4 MG tablet    Sig: Take 1 tablet (4 mg total) by mouth every 8 (eight) hours as needed for muscle spasms.    Dispense:  90 tablet    Refill:  5     Follow up plan: Return in about 4 months (around 09/23/2023) for 4 month DM A1c.   Saralyn Pilar, DO [facility] 05/26/2023, 3:29 PM

## 2023-05-26 NOTE — Patient Instructions (Addendum)
Thank you for coming to the office today.  Low Dose CT lung CA Screening - they will call you to schedule.  Referral to GI for Colonoscopy screening, prefer 07/2023  Recent Labs    10/07/22 1621 05/23/23 0806  HGBA1C 8.2* 10.7*   Elevated blood sugar. We will add a 2nd pill to the Metformin, keep current dose  Add Jardiance 25mg  daily to your medication list. We will work on [employer] approval first.  If not approved or high cost, we can contact our pharmacist.  Switch from Flexeril over to Tizanidine instead for muscles and back pain. Your symptoms are mostly from arthritis of the spine   Please schedule a Follow-up Appointment to: Return in about 4 months (around 09/23/2023) for 4 month DM A1c.  If you have any other questions or concerns, please feel free to call the office or send a message through MyChart. You may also schedule an earlier appointment if necessary.  Additionally, you may be receiving a survey about your experience at our office within a few days to 1 week by e-mail or mail. We value your feedback.  Saralyn Pilar, DO [facility], [place]

## 2023-05-28 ENCOUNTER — Encounter: Payer: Self-pay | Admitting: Family Medicine

## 2023-06-02 ENCOUNTER — Encounter: Payer: Self-pay | Admitting: Cardiology

## 2023-06-02 ENCOUNTER — Other Ambulatory Visit: Payer: Self-pay

## 2023-06-02 ENCOUNTER — Ambulatory Visit: Payer: 59 | Attending: Cardiology | Admitting: Cardiology

## 2023-06-02 VITALS — BP 134/76 | HR 85 | Ht 69.0 in | Wt 174.4 lb

## 2023-06-02 DIAGNOSIS — I10 Essential (primary) hypertension: Secondary | ICD-10-CM

## 2023-06-02 DIAGNOSIS — F17200 Nicotine dependence, unspecified, uncomplicated: Secondary | ICD-10-CM | POA: Diagnosis not present

## 2023-06-02 DIAGNOSIS — I2510 Atherosclerotic heart disease of native coronary artery without angina pectoris: Secondary | ICD-10-CM

## 2023-06-02 DIAGNOSIS — E782 Mixed hyperlipidemia: Secondary | ICD-10-CM

## 2023-06-02 MED ORDER — LISINOPRIL-HYDROCHLOROTHIAZIDE 10-12.5 MG PO TABS: 1.0000 | ORAL_TABLET | Freq: Every day | ORAL | 2 refills | Status: DC

## 2023-06-02 MED ORDER — RANOLAZINE ER 500 MG PO TB12: 500.0000 mg | ORAL_TABLET | Freq: Two times a day (BID) | ORAL | 3 refills | Status: DC

## 2023-06-02 NOTE — Telephone Encounter (Signed)
Requested Prescriptions   Signed Prescriptions Disp Refills   lisinopril-hydrochlorothiazide (ZESTORETIC) 10-12.5 MG tablet 90 tablet 2    Sig: Take 1 tablet by mouth daily.    Authorizing Provider: Debbe Odea    Ordering User: Guerry Minors

## 2023-06-02 NOTE — Patient Instructions (Signed)
Medication Instructions:  No changes at this time.   *If you need a refill on your cardiac medications before your next appointment, please call your pharmacy*   Lab Work: None  If you have labs (blood work) drawn today and your tests are completely normal, you will receive your results only by: [facility] (if you have MyChart) OR A paper copy in the mail If you have any lab test that is abnormal or we need to change your treatment, we will call you to review the results.   Testing/Procedures: None   Follow-Up: At [facility], you and your health needs are our priority.  As part of our continuing mission to provide you with exceptional heart care, we have created designated Provider Care Teams.  These Care Teams include your primary Cardiologist (physician) and Advanced Practice Providers (APPs -  Physician Assistants and Nurse Practitioners) who all work together to provide you with the care you need, when you need it.   Your next appointment:   1 year(s)  Provider:   Kate Sable, MD

## 2023-06-02 NOTE — Progress Notes (Signed)
Cardiology Office Note:    Date:  06/02/2023   ID:  Clifford Morse, DOB [birth year], MRN [id number]  PCP:  Smitty Cords, DO  [facility] Cardiologist:  Debbe Odea, MD  [facility] Electrophysiologist:  None   Referring MD: Saralyn Pilar *   Chief Complaint  Patient presents with   Follow-up    Patient denies new or acute cardiac problems/concerns today.       History of Present Illness:    Clifford Morse is a 62 y.o. male with a hx of MI/CAD (s/p DES x 2 - mid LAD 03/2013, mid to distal LAD stent 06/19/2020), hypertension, hyperlipidemia, current smoker x35+ years, COPD who presents for follow-up.   Denies chest pain or shortness of breath.  Compliant with medicines as prescribed.  He still smokes.  Denies any cardiac concerns today.  Had some back pain with right shin numbness, plans to follow-up with PCP regarding this.   Prior notes [name] 10/2021 no significant ischemia. Had an MI in 2014, drug-eluting stent was placed to the LAD. Left heart cath 06/19/2020 patent mid LAD stent, mid to distal LAD 80% disease status post DES. Mild left circumflex and RCA disease,  Echo 05/24/2020 showed normal systolic and diastolic function, EF 60 to 65%  Past Medical History:  Diagnosis Date   Allergy    Arthritis    Asthma    Chest pain on exertion    Diabetes mellitus, type 2 (HCC)    GERD (gastroesophageal reflux disease)    Hyperlipidemia    Vertigo    with sinus issues    Past Surgical History:  Procedure Laterality Date   CARDIAC CATHETERIZATION  2014   stent   CORONARY STENT INTERVENTION  2014   CORONARY STENT INTERVENTION N/A 06/19/2020   Procedure: CORONARY STENT INTERVENTION;  Surgeon: Iran Ouch, MD;  Location: [facility] INVASIVE CV LAB;  Service: Cardiovascular;  Laterality: N/A;   ESOPHAGOGASTRODUODENOSCOPY     HERNIA REPAIR     LEFT HEART CATH AND CORONARY ANGIOGRAPHY Left 06/19/2020   Procedure: LEFT HEART CATH AND  CORONARY ANGIOGRAPHY;  Surgeon: Iran Ouch, MD;  Location: [facility] INVASIVE CV LAB;  Service: Cardiovascular;  Laterality: Left;    Current Medications: Current Meds  Medication Sig   aspirin 81 MG tablet Take 81 mg by mouth daily.   carvedilol (COREG) 25 MG tablet Take 1 tablet (25 mg total) by mouth 2 (two) times daily with a meal.   clopidogrel (PLAVIX) 75 MG tablet Take 1 tablet (75 mg total) by mouth daily.   DULoxetine (CYMBALTA) 60 MG capsule Take 1 capsule by mouth once daily   fenofibrate 160 MG tablet Take 1 tablet (160 mg total) by mouth daily.   gabapentin (NEURONTIN) 600 MG tablet TAKE 1 TABLET BY MOUTH AT BEDTIME   icosapent Ethyl (VASCEPA) 1 g capsule Take 2 capsules by mouth twice daily   JARDIANCE 25 MG TABS tablet Take 1 tablet (25 mg total) by mouth daily before breakfast.   lisinopril-hydrochlorothiazide (ZESTORETIC) 10-12.5 MG tablet Take 1 tablet by mouth once daily   metFORMIN (GLUCOPHAGE-XR) 750 MG 24 hr tablet TAKE 1 TABLET BY MOUTH TWICE DAILY WITH A MEAL   pantoprazole (PROTONIX) 40 MG tablet Take 1 tablet (40 mg total) by mouth daily before breakfast.   pseudoephedrine-acetaminophen (TYLENOL SINUS) 30-500 MG TABS tablet Take 2 tablets by mouth 2 (two) times daily as needed (congestion).   rosuvastatin (CRESTOR) 40 MG tablet Take 1 tablet (40 mg  total) by mouth daily.   tadalafil (CIALIS) 20 MG tablet Take 1 tablet (20 mg total) by mouth every other day as needed for erectile dysfunction.   tiZANidine (ZANAFLEX) 4 MG tablet Take 1 tablet (4 mg total) by mouth every 8 (eight) hours as needed for muscle spasms.   traZODone (DESYREL) 100 MG tablet TAKE 1 TABLET BY MOUTH AT BEDTIME   [DISCONTINUED] ranolazine (RANEXA) 500 MG 12 hr tablet Take 1 tablet (500 mg total) by mouth 2 (two) times daily.     Allergies:   Crestor [rosuvastatin]   Social History   Socioeconomic History   Marital status: Married    Spouse name: Marylu Lund    Number of children: 2   Years of  education: Not on file   Highest education level: Not on file  Occupational History   Not on file  Tobacco Use   Smoking status: Every Day    Current packs/day: 2.00    Average packs/day: 2.0 packs/day for 40.0 years (80.0 ttl pk-yrs)    Types: Cigarettes   Smokeless tobacco: Former   Tobacco comments:    1PPD   Vaping Use   Vaping status: Never Used  Substance and Sexual Activity   Alcohol use: Yes    Alcohol/week: 0.0 standard drinks of alcohol    Comment: rare   Drug use: No   Sexual activity: Not on file  Other Topics Concern   Not on file  Social History Narrative   Lives at home with wife   Social Determinants of Health   Financial Resource Strain: Medium Risk (05/26/2023)   Overall Financial Resource Strain (CARDIA)    Difficulty of Paying Living Expenses: Somewhat hard  Food Insecurity: No Food Insecurity (04/03/2022)   Hunger Vital Sign    Worried About Running Out of Food in the Last Year: Never true    Ran Out of Food in the Last Year: Never true  Transportation Needs: No Transportation Needs (04/03/2022)   PRAPARE - Administrator, Civil Service (Medical): No    Lack of Transportation (Non-Medical): No  Physical Activity: Unknown (05/26/2023)   Exercise Vital Sign    Days of Exercise per Week: 0 days    Minutes of Exercise per Session: Not on file  Stress: No Stress Concern Present (05/26/2023)   [employer] of Occupational Health - Occupational Stress Questionnaire    Feeling of Stress : Only a little  Social Connections: Socially Isolated (05/26/2023)   Social Connection and Isolation Panel [NHANES]    Frequency of Communication with Friends and Family: Once a week    Frequency of Social Gatherings with Friends and Family: Once a week    Attends Religious Services: Never    Database administrator or Organizations: No    Attends Engineer, structural: Never    Marital Status: Married     Family History: The patient's family  history includes Diabetes in his mother; Emphysema in his father.  ROS:   Please see the history of present illness.     All other systems reviewed and are negative.  EKGs/Labs/Other Studies Reviewed:    The following studies were reviewed today:   EKG Interpretation Date/Time:  Monday June 02 2023 15:09:57 EST Ventricular Rate:  85 PR Interval:  170 QRS Duration:  90 QT Interval:  374 QTC Calculation: 445 R Axis:   -40  Text Interpretation: Normal sinus rhythm Left axis deviation Confirmed by Debbe Odea ([id number]) on 06/02/2023 3:12:54 PM  Recent Labs: 05/23/2023: ALT 27; BUN 13; Creat 0.95; Hemoglobin 15.0; Platelets 296; Potassium 4.8; Sodium 135; TSH 1.05  Recent Lipid Panel    Component Value Date/Time   CHOL 101 05/23/2023 0806   CHOL 163 06/23/2015 0933   CHOL 209 (H) 02/28/2013 0324   TRIG 187 (H) 05/23/2023 0806   TRIG 247 (H) 02/28/2013 0324   HDL 33 (L) 05/23/2023 0806   HDL 41 06/23/2015 0933   HDL 38 (L) 02/28/2013 0324   CHOLHDL 3.1 05/23/2023 0806   VLDL 50 (H) 04/12/2022 0858   VLDL 49 (H) 02/28/2013 0324   LDLCALC 42 05/23/2023 0806   LDLCALC 122 (H) 02/28/2013 0324     Risk Assessment/Calculations:      Physical Exam:    VS:  BP 134/76 (BP Location: Left Arm, Patient Position: Sitting, Cuff Size: Large)   Pulse 85   Ht 5\' 9"  (1.753 m)   Wt 174 lb 6.4 oz (79.1 kg)   SpO2 95%   BMI 25.75 kg/m     Wt Readings from Last 3 Encounters:  06/02/23 174 lb 6.4 oz (79.1 kg)  05/26/23 176 lb (79.8 kg)  11/27/22 182 lb 12.8 oz (82.9 kg)     GEN:  Well nourished, well developed in no acute distress HEENT: Normal NECK: No JVD; No carotid bruits CARDIAC: RRR, no murmurs, rubs, gallops RESPIRATORY:  Clear to auscultation without rales, wheezing or rhonchi  ABDOMEN: Soft, non-tender, non-distended MUSCULOSKELETAL:  No edema; No deformity  SKIN: Warm and dry NEUROLOGIC:  Alert and oriented x 3 PSYCHIATRIC:  Normal affect   ASSESSMENT:     1. Coronary artery disease involving native coronary artery of native heart, unspecified whether angina present   2. Mixed hyperlipidemia   3. Primary hypertension   4. Smoking    PLAN:    In order of problems listed above:  CAD/MI s/p DES to LAD x 2 (2014).  Denies chest pain.  EF 60 to 65%.  Continue Ranexa 500 mg twice daily, aspirin, Plavix, Coreg, Crestor, fenofibrate.  Avoiding nitrates due to plans for Cialis use.  Hyperlipidemia, LDL at goal. Continue low-cholesterol diet, Vascepa, fenofibrate, Crestor 20 mg daily. hypertension, BP controlled.  Continue Coreg 25mg  bid, lisinopril 10mg  qd, hydrochlorothiazide 12.5mg  qd.  Follow-up in 12 months  Medication Adjustments/Labs and Tests Ordered: Current medicines are reviewed at length with the patient today.  Concerns regarding medicines are outlined above.  Orders Placed This Encounter  Procedures   EKG 12-Lead   Meds ordered this encounter  Medications   ranolazine (RANEXA) 500 MG 12 hr tablet    Sig: Take 1 tablet (500 mg total) by mouth 2 (two) times daily.    Dispense:  180 tablet    Refill:  3    Patient Instructions  Medication Instructions:  No changes at this time.   *If you need a refill on your cardiac medications before your next appointment, please call your pharmacy*   Lab Work: None  If you have labs (blood work) drawn today and your tests are completely normal, you will receive your results only by: MyChart Message (if you have MyChart) OR A paper copy in the mail If you have any lab test that is abnormal or we need to change your treatment, we will call you to review the results.   Testing/Procedures: None   Follow-Up: At [facility], you and your health needs are our priority.  As part of our continuing mission to provide you with exceptional  heart care, we have created designated Provider Care Teams.  These Care Teams include your primary Cardiologist (physician) and Advanced  Practice Providers (APPs -  Physician Assistants and Nurse Practitioners) who all work together to provide you with the care you need, when you need it.   Your next appointment:   1 year(s)  Provider:   Debbe Odea, MD    Signed, Debbe Odea, MD  06/02/2023 3:45 PM    [facility]

## 2023-06-06 ENCOUNTER — Other Ambulatory Visit: Payer: Self-pay | Admitting: Family Medicine

## 2023-06-06 DIAGNOSIS — E1169 Type 2 diabetes mellitus with other specified complication: Secondary | ICD-10-CM

## 2023-06-07 ENCOUNTER — Other Ambulatory Visit: Payer: Self-pay | Admitting: Family Medicine

## 2023-06-07 DIAGNOSIS — F5104 Psychophysiologic insomnia: Secondary | ICD-10-CM

## 2023-06-07 DIAGNOSIS — F331 Major depressive disorder, recurrent, moderate: Secondary | ICD-10-CM

## 2023-06-09 ENCOUNTER — Encounter: Payer: Self-pay | Admitting: *Deleted

## 2023-06-09 NOTE — Telephone Encounter (Signed)
Requested Prescriptions  Pending Prescriptions Disp Refills   metFORMIN (GLUCOPHAGE-XR) 750 MG 24 hr tablet [Pharmacy Med Name: metFORMIN HCl ER 750 MG Oral Tablet Extended Release 24 Hour] 180 tablet 1    Sig: TAKE 1 TABLET BY MOUTH TWICE DAILY WITH A MEAL     Endocrinology:  Diabetes - Biguanides Failed - 06/06/2023  6:52 AM      Failed - HBA1C is between 0 and 7.9 and within 180 days    Hemoglobin A1C  Date Value Ref Range Status  02/27/2013 5.9 4.2 - 6.3 % Final    Comment:    The American Diabetes Association recommends that a primary goal of therapy should be <7% and that physicians should reevaluate the treatment regimen in patients with HbA1c values consistently >8%.    Hgb A1c MFr Bld  Date Value Ref Range Status  05/23/2023 10.7 (H) <5.7 % of total Hgb Final    Comment:    For someone without known diabetes, a hemoglobin A1c value of 6.5% or greater indicates that they may have  diabetes and this should be confirmed with a follow-up  test. . For someone with known diabetes, a value <7% indicates  that their diabetes is well controlled and a value  greater than or equal to 7% indicates suboptimal  control. A1c targets should be individualized based on  duration of diabetes, age, comorbid conditions, and  other considerations. . Currently, no consensus exists regarding use of hemoglobin A1c for diagnosis of diabetes for children. .          Failed - B12 Level in normal range and within 720 days    No results found for: "VITAMINB12"       Passed - Cr in normal range and within 360 days    Creat  Date Value Ref Range Status  05/23/2023 0.95 0.70 - 1.35 mg/dL Final   Creatinine, Urine  Date Value Ref Range Status  10/07/2022 80 20 - 320 mg/dL Final         Passed - eGFR in normal range and within 360 days    GFR, Est African American  Date Value Ref Range Status  04/18/2020 113 > OR = 60 mL/min/1.17m2 Final   GFR calc Af Amer  Date Value Ref Range Status   05/22/2020 118 >59 mL/min/1.73 Final    Comment:    **In accordance with recommendations from the NKF-ASN Task force,**   [facility] is in the process of updating its eGFR calculation to the   2021 CKD-EPI creatinine equation that estimates kidney function   without a race variable.    GFR, Est Non African American  Date Value Ref Range Status  04/18/2020 97 > OR = 60 mL/min/1.17m2 Final   GFR, Estimated  Date Value Ref Range Status  06/20/2020 >60 >60 mL/min Final    Comment:    (NOTE) Calculated using the CKD-EPI Creatinine Equation (2021)    eGFR  Date Value Ref Range Status  05/23/2023 90 > OR = 60 mL/min/1.69m2 Final         Passed - Valid encounter within last 6 months    Recent Outpatient Visits           2 weeks ago Annual physical exam   [place] [facility] [place], Netta Neat, DO   8 months ago Type 2 diabetes mellitus with other specified complication, without long-term current use of insulin [facility])   [place] [facility] [place], Netta Neat, [place]  1 year ago Annual physical exam   [place] [facility] [place], Netta Neat, DO   1 year ago Chronic myofascial pain   [place] [facility] [place], Netta Neat, DO   1 year ago Type 2 diabetes mellitus with other specified complication, without long-term current use of insulin (HCC)   [place] [facility] [place], Netta Neat, DO       Future Appointments             In 3 months Althea Charon, Netta Neat, DO [place]            Passed - CBC within normal limits and completed in the last 12 months    WBC  Date Value Ref Range Status  05/23/2023 9.7 3.8 - 10.8 Thousand/uL Final   RBC  Date Value Ref Range Status  05/23/2023 4.86 4.20 - 5.80 Million/uL Final   Hemoglobin  Date Value Ref Range Status  05/23/2023 15.0 13.2 - 17.1 g/dL Final  16/04/9603  54.0 13.0 - 17.7 g/dL Final   HCT  Date Value Ref Range Status  05/23/2023 44.5 38.5 - 50.0 % Final   Hematocrit  Date Value Ref Range Status  05/22/2020 41.4 37.5 - 51.0 % Final   MCHC  Date Value Ref Range Status  05/23/2023 33.7 32.0 - 36.0 g/dL Final    Comment:    For adults, a slight decrease in the calculated MCHC value (in the range of 30 to 32 g/dL) is most likely not clinically significant; however, it should be interpreted with caution in correlation with other red cell parameters and the patient's clinical condition.    [facility]  Date Value Ref Range Status  05/23/2023 30.9 27.0 - 33.0 pg Final   MCV  Date Value Ref Range Status  05/23/2023 91.6 80.0 - 100.0 fL Final  05/22/2020 88 79 - 97 fL Final  02/27/2013 91 80 - 100 fL Final   No results found for: "PLTCOUNTKUC", "LABPLAT", "POCPLA" RDW  Date Value Ref Range Status  05/23/2023 11.9 11.0 - 15.0 % Final  05/22/2020 11.7 11.6 - 15.4 % Final  02/27/2013 12.1 11.5 - 14.5 % Final

## 2023-06-09 NOTE — Telephone Encounter (Signed)
Requested Prescriptions  Pending Prescriptions Disp Refills   traZODone (DESYREL) 100 MG tablet [Pharmacy Med Name: traZODone HCl 100 MG Oral Tablet] 90 tablet 1    Sig: TAKE 1 TABLET BY MOUTH AT BEDTIME     Psychiatry: Antidepressants - Serotonin Modulator Passed - 06/07/2023  9:25 AM      Passed - Completed PHQ-2 or PHQ-9 in the last 360 days      Passed - Valid encounter within last 6 months    Recent Outpatient Visits           2 weeks ago Annual physical exam   [facility] [place], Netta Neat, DO   8 months ago Type 2 diabetes mellitus with other specified complication, without long-term current use of insulin [facility])   [facility] Althea Charon, Netta Neat, DO   1 year ago Annual physical exam   [place] [facility] Smitty Cords, DO   1 year ago Chronic myofascial pain   [facility] [place], Netta Neat, DO   1 year ago Type 2 diabetes mellitus with other specified complication, without long-term current use of insulin [facility])   [place] [facility] Althea Charon, Netta Neat, DO       Future Appointments             In 3 months Althea Charon, Netta Neat, DO  [facility], [facility]

## 2023-06-11 ENCOUNTER — Other Ambulatory Visit: Payer: Self-pay

## 2023-06-11 DIAGNOSIS — F17210 Nicotine dependence, cigarettes, uncomplicated: Secondary | ICD-10-CM

## 2023-06-11 DIAGNOSIS — Z122 Encounter for screening for malignant neoplasm of respiratory organs: Secondary | ICD-10-CM

## 2023-06-11 DIAGNOSIS — Z87891 Personal history of nicotine dependence: Secondary | ICD-10-CM

## 2023-06-16 ENCOUNTER — Encounter: Payer: Self-pay | Admitting: Adult Health

## 2023-06-16 ENCOUNTER — Ambulatory Visit (INDEPENDENT_AMBULATORY_CARE_PROVIDER_SITE_OTHER): Payer: 59 | Admitting: Adult Health

## 2023-06-16 DIAGNOSIS — F17210 Nicotine dependence, cigarettes, uncomplicated: Secondary | ICD-10-CM

## 2023-06-16 NOTE — Progress Notes (Signed)
  Virtual Visit via Telephone Note  I connected with Clifford Morse , 06/16/23 9:07 AM by a telemedicine application and verified that I am speaking with the correct person using two identifiers.  Location: Patient: home Provider: home   I discussed the limitations of evaluation and management by telemedicine and the availability of in person appointments. The patient expressed understanding and agreed to proceed.   Shared Decision Making Visit Lung Cancer Screening Program [phone])   Eligibility: 62 y.o. Pack Years Smoking History Calculation =78 pack years  (# packs/per year x # years smoked) Recent History of coughing up blood  no Unexplained weight loss? no ( >Than 15 pounds within the last 6 months ) Prior History Lung / other cancer no (Diagnosis within the last 5 years already requiring surveillance chest CT Scans). Smoking Status Current Smoker   Visit Components: Discussion included one or more decision making aids. YES Discussion included risk/benefits of screening. YES Discussion included potential follow up diagnostic testing for abnormal scans. YES Discussion included meaning and risk of over diagnosis. YES Discussion included meaning and risk of False Positives. YES Discussion included meaning of total radiation exposure. YES  Counseling Included: Importance of adherence to annual lung cancer LDCT screening. YES Impact of comorbidities on ability to participate in the program. YES Ability and willingness to under diagnostic treatment. YES  Smoking Cessation Counseling: Current Smokers:  Discussed importance of smoking cessation. yes Information about tobacco cessation classes and interventions provided to patient. yes Patient provided with "ticket" for LDCT Scan. yes Symptomatic Patient. no Diagnosis Code: Tobacco Use Z72.0 Asymptomatic Patient yes  Counseling (Intermediate counseling: > three minutes counseling) O9629  Z12.2-Screening of respiratory  organs Z87.891-Personal history of nicotine dependence   [name] 06/16/23

## 2023-06-16 NOTE — Patient Instructions (Signed)

## 2023-06-19 ENCOUNTER — Other Ambulatory Visit: Payer: Self-pay

## 2023-06-19 MED ORDER — FENOFIBRATE 160 MG PO TABS: 160.0000 mg | ORAL_TABLET | Freq: Every day | ORAL | 3 refills | Status: DC

## 2023-06-20 ENCOUNTER — Ambulatory Visit: Payer: 59

## 2023-06-20 ENCOUNTER — Ambulatory Visit
Admission: RE | Admit: 2023-06-20 | Discharge: 2023-06-20 | Disposition: A | Discharge status: Home / Self Care | Payer: 59 | Source: Ambulatory Visit | Attending: Acute Care | Admitting: Acute Care

## 2023-06-20 DIAGNOSIS — Z87891 Personal history of nicotine dependence: Secondary | ICD-10-CM

## 2023-06-20 DIAGNOSIS — Z122 Encounter for screening for malignant neoplasm of respiratory organs: Secondary | ICD-10-CM

## 2023-06-20 DIAGNOSIS — F17210 Nicotine dependence, cigarettes, uncomplicated: Secondary | ICD-10-CM

## 2023-06-28 ENCOUNTER — Other Ambulatory Visit: Payer: Self-pay | Admitting: Family Medicine

## 2023-06-28 DIAGNOSIS — K219 Gastro-esophageal reflux disease without esophagitis: Secondary | ICD-10-CM

## 2023-07-03 NOTE — Telephone Encounter (Signed)
 Requested Prescriptions  Pending Prescriptions Disp Refills   pantoprazole  (PROTONIX ) 40 MG tablet [Pharmacy Med Name: Pantoprazole  Sodium 40 MG Oral Tablet Delayed Release] 30 tablet 11    Sig: TAKE 1 TABLET BY MOUTH ONCE DAILY BEFORE BREAKFAST     Gastroenterology: Proton Pump Inhibitors Passed - 07/03/2023 12:08 PM      Passed - Valid encounter within last 12 months    Recent Outpatient Visits           1 month ago Annual physical exam   [facility] [place], Marsa PARAS, DO   8 months ago Type 2 diabetes mellitus with other specified complication, without long-term current use of insulin [facility])   [place] [facility] [place], Marsa PARAS, DO   1 year ago Annual physical exam   [place] [facility] [place] Marsa PARAS, DO   1 year ago Chronic myofascial pain   [place] [facility] [place], Marsa PARAS, DO   1 year ago Type 2 diabetes mellitus with other specified complication, without long-term current use of insulin [facility])   [place] [facility] [place], Marsa PARAS, DO       Future Appointments             In 2 months Ed[place], Marsa PARAS, DO  [facility], [facility] [place]

## 2023-07-04 ENCOUNTER — Other Ambulatory Visit: Payer: Self-pay | Admitting: Family Medicine

## 2023-07-04 ENCOUNTER — Other Ambulatory Visit: Payer: Self-pay

## 2023-07-04 DIAGNOSIS — F17210 Nicotine dependence, cigarettes, uncomplicated: Secondary | ICD-10-CM

## 2023-07-04 DIAGNOSIS — Z87891 Personal history of nicotine dependence: Secondary | ICD-10-CM

## 2023-07-04 DIAGNOSIS — Z122 Encounter for screening for malignant neoplasm of respiratory organs: Secondary | ICD-10-CM

## 2023-07-04 DIAGNOSIS — F331 Major depressive disorder, recurrent, moderate: Secondary | ICD-10-CM

## 2023-07-07 NOTE — Telephone Encounter (Signed)
 Requested Prescriptions  Pending Prescriptions Disp Refills   DULoxetine  (CYMBALTA ) 60 MG capsule [Pharmacy Med Name: DULoxetine  HCl 60 MG Oral Capsule Delayed Release Particles] 90 capsule 1    Sig: Take 1 capsule by mouth once daily     Psychiatry: Antidepressants - SNRI - duloxetine  Passed - 07/07/2023  5:37 PM      Passed - Cr in normal range and within 360 days    Creat  Date Value Ref Range Status  05/23/2023 0.95 0.70 - 1.35 mg/dL Final   Creatinine, Urine  Date Value Ref Range Status  10/07/2022 80 20 - 320 mg/dL Final         Passed - eGFR is 30 or above and within 360 days    GFR, Est African American  Date Value Ref Range Status  04/18/2020 113 > OR = 60 mL/min/1.91m2 Final   GFR calc Af Amer  Date Value Ref Range Status  05/22/2020 118 >59 mL/min/1.73 Final    Comment:    **In accordance with recommendations from the NKF-ASN Task force,**   [facility] is in the process of updating its eGFR calculation to the   2021 CKD-EPI creatinine equation that estimates kidney function   without a race variable.    GFR, Est Non African American  Date Value Ref Range Status  04/18/2020 97 > OR = 60 mL/min/1.79m2 Final   GFR, Estimated  Date Value Ref Range Status  06/20/2020 >60 >60 mL/min Final    Comment:    (NOTE) Calculated using the CKD-EPI Creatinine Equation (2021)    eGFR  Date Value Ref Range Status  05/23/2023 90 > OR = 60 mL/min/1.37m2 Final         Passed - Completed PHQ-2 or PHQ-9 in the last 360 days      Passed - Last BP in normal range    BP Readings from Last 1 Encounters:  06/02/23 134/76         Passed - Valid encounter within last 6 months    Recent Outpatient Visits           1 month ago Annual physical exam   [place] [facility] [place], Marsa PARAS, DO   9 months ago Type 2 diabetes mellitus with other specified complication, without long-term current use of insulin [facility])   [facility]  [place], Marsa PARAS, DO   1 year ago Annual physical exam   [place] [place] Marsa PARAS, DO   1 year ago Chronic myofascial pain   [place] [facility] [place], Marsa PARAS, DO   1 year ago Type 2 diabetes mellitus with other specified complication, without long-term current use of insulin [facility])   [facility] [place], Marsa PARAS, DO       Future Appointments             In 2 months [place], Marsa PARAS, DO [place] [facility], [facility]

## 2023-07-24 ENCOUNTER — Other Ambulatory Visit: Payer: Self-pay | Admitting: Family Medicine

## 2023-07-24 DIAGNOSIS — I2510 Atherosclerotic heart disease of native coronary artery without angina pectoris: Secondary | ICD-10-CM

## 2023-07-24 DIAGNOSIS — I10 Essential (primary) hypertension: Secondary | ICD-10-CM

## 2023-07-25 NOTE — Telephone Encounter (Signed)
Requested Prescriptions  Pending Prescriptions Disp Refills   carvedilol (COREG) 25 MG tablet [Pharmacy Med Name: Carvedilol 25 MG Oral Tablet] 60 tablet 0    Sig: TAKE 1 TABLET BY MOUTH TWICE DAILY WITH A MEAL     Cardiovascular: Beta Blockers 3 Passed - 07/25/2023  9:34 AM      Passed - Cr in normal range and within 360 days    Creat  Date Value Ref Range Status  05/23/2023 0.95 0.70 - 1.35 mg/dL Final   Creatinine, Urine  Date Value Ref Range Status  10/07/2022 80 20 - 320 mg/dL Final         Passed - AST in normal range and within 360 days    AST  Date Value Ref Range Status  05/23/2023 22 10 - 35 U/L Final   SGOT(AST)  Date Value Ref Range Status  02/27/2013 40 (H) 15 - 37 Unit/L Final         Passed - ALT in normal range and within 360 days    ALT  Date Value Ref Range Status  05/23/2023 27 9 - 46 U/L Final   SGPT (ALT)  Date Value Ref Range Status  02/27/2013 73 12 - 78 U/L Final         Passed - Last BP in normal range    BP Readings from Last 1 Encounters:  06/02/23 134/76         Passed - Last Heart Rate in normal range    Pulse Readings from Last 1 Encounters:  06/02/23 85         Passed - Valid encounter within last 6 months    Recent Outpatient Visits           2 months ago Annual physical exam   [place] [facility], Netta Neat, DO   9 months ago Type 2 diabetes mellitus with other specified complication, without long-term current use of insulin [facility])   [place] [facility] [place], Netta Neat, DO   1 year ago Annual physical exam   [place] [facility] Smitty Cords, DO   1 year ago Chronic myofascial pain   [place] [facility] [place], Netta Neat, DO   1 year ago Type 2 diabetes mellitus with other specified complication, without long-term current use of insulin [facility])   [place] [facility] [place], Netta Neat, DO       Future Appointments             In 2 months Althea Charon, Netta Neat, DO [place] [facility], [facility]

## 2023-08-02 ENCOUNTER — Other Ambulatory Visit: Payer: Self-pay | Admitting: Family Medicine

## 2023-08-02 DIAGNOSIS — G8929 Other chronic pain: Secondary | ICD-10-CM

## 2023-08-02 DIAGNOSIS — G894 Chronic pain syndrome: Secondary | ICD-10-CM

## 2023-08-04 NOTE — Telephone Encounter (Signed)
Requested Prescriptions  Pending Prescriptions Disp Refills   gabapentin (NEURONTIN) 600 MG tablet [Pharmacy Med Name: Gabapentin 600 MG Oral Tablet] 90 tablet 0    Sig: TAKE 1 TABLET BY MOUTH AT BEDTIME     Neurology: Anticonvulsants - gabapentin Passed - 08/04/2023  1:46 PM      Passed - Cr in normal range and within 360 days    Creat  Date Value Ref Range Status  05/23/2023 0.95 0.70 - 1.35 mg/dL Final   Creatinine, Urine  Date Value Ref Range Status  10/07/2022 80 20 - 320 mg/dL Final         Passed - Completed PHQ-2 or PHQ-9 in the last 360 days      Passed - Valid encounter within last 12 months    Recent Outpatient Visits           2 months ago Annual physical exam   [place] [facility] [place], Netta Neat, DO   10 months ago Type 2 diabetes mellitus with other specified complication, without long-term current use of insulin [facility])   [place] [facility] [place], Netta Neat, DO   1 year ago Annual physical exam   [place] [facility] Smitty Cords, DO   1 year ago Chronic myofascial pain   [place] [facility] [place], Netta Neat, DO   1 year ago Type 2 diabetes mellitus with other specified complication, without long-term current use of insulin [facility])   [place] [facility] [place], Netta Neat, DO       Future Appointments             In 1 month Althea Charon, Netta Neat, DO  [facility], [facility]

## 2023-08-24 ENCOUNTER — Other Ambulatory Visit: Payer: Self-pay | Admitting: Family Medicine

## 2023-08-24 DIAGNOSIS — E1169 Type 2 diabetes mellitus with other specified complication: Secondary | ICD-10-CM

## 2023-08-26 DIAGNOSIS — M5442 Lumbago with sciatica, left side: Secondary | ICD-10-CM | POA: Diagnosis not present

## 2023-08-26 DIAGNOSIS — M5416 Radiculopathy, lumbar region: Secondary | ICD-10-CM | POA: Diagnosis not present

## 2023-08-26 DIAGNOSIS — M5441 Lumbago with sciatica, right side: Secondary | ICD-10-CM | POA: Diagnosis not present

## 2023-08-26 DIAGNOSIS — G8929 Other chronic pain: Secondary | ICD-10-CM | POA: Diagnosis not present

## 2023-08-26 NOTE — Telephone Encounter (Signed)
 Requested by interface surescripts. Future visit in 4 weeks.  Requested Prescriptions  Pending Prescriptions Disp Refills   JARDIANCE 25 MG TABS tablet [Pharmacy Med Name: Jardiance 25 MG Oral Tablet] 30 tablet 0    Sig: TAKE 1 TABLET BY MOUTH ONCE DAILY BEFORE BREAKFAST     Endocrinology:  Diabetes - SGLT2 Inhibitors Failed - 08/26/2023  9:31 AM      Failed - HBA1C is between 0 and 7.9 and within 180 days    Hemoglobin A1C  Date Value Ref Range Status  02/27/2013 5.9 4.2 - 6.3 % Final    Comment:    The American Diabetes Association recommends that a primary goal of therapy should be <7% and that physicians should reevaluate the treatment regimen in patients with HbA1c values consistently >8%.    Hgb A1c MFr Bld  Date Value Ref Range Status  05/23/2023 10.7 (H) <5.7 % of total Hgb Final    Comment:    For someone without known diabetes, a hemoglobin A1c value of 6.5% or greater indicates that they may have  diabetes and this should be confirmed with a follow-up  test. . For someone with known diabetes, a value <7% indicates  that their diabetes is well controlled and a value  greater than or equal to 7% indicates suboptimal  control. A1c targets should be individualized based on  duration of diabetes, age, comorbid conditions, and  other considerations. . Currently, no consensus exists regarding use of hemoglobin A1c for diagnosis of diabetes for children. .          Passed - Cr in normal range and within 360 days    Creat  Date Value Ref Range Status  05/23/2023 0.95 0.70 - 1.35 mg/dL Final   Creatinine, Urine  Date Value Ref Range Status  10/07/2022 80 20 - 320 mg/dL Final         Passed - eGFR in normal range and within 360 days    GFR, Est African American  Date Value Ref Range Status  04/18/2020 113 > OR = 60 mL/min/1.70m2 Final   GFR calc Af Amer  Date Value Ref Range Status  05/22/2020 118 >59 mL/min/1.73 Final    Comment:    **In accordance with  recommendations from the NKF-ASN Task force,**   [facility] is in the process of updating its eGFR calculation to the   2021 CKD-EPI creatinine equation that estimates kidney function   without a race variable.    GFR, Est Non African American  Date Value Ref Range Status  04/18/2020 97 > OR = 60 mL/min/1.4m2 Final   GFR, Estimated  Date Value Ref Range Status  06/20/2020 >60 >60 mL/min Final    Comment:    (NOTE) Calculated using the CKD-EPI Creatinine Equation (2021)    eGFR  Date Value Ref Range Status  05/23/2023 90 > OR = 60 mL/min/1.34m2 Final         Passed - Valid encounter within last 6 months    Recent Outpatient Visits           3 months ago Annual physical exam   [place] [facility] [place], Netta Neat, DO   10 months ago Type 2 diabetes mellitus with other specified complication, without long-term current use of insulin [facility])   [place] [facility] [place] Cords, DO   1 year ago Annual physical exam   [place] [facility] [place], Netta Neat, DO   1  year ago Chronic myofascial pain   [place] [facility] [place], Netta Neat, DO   1 year ago Type 2 diabetes mellitus with other specified complication, without long-term current use of insulin [facility])   [place] [facility] [place], Netta Neat, DO       Future Appointments             In 4 weeks Althea Charon, Netta Neat, DO [place] [facility], [facility]

## 2023-09-03 ENCOUNTER — Other Ambulatory Visit: Payer: Self-pay | Admitting: Family Medicine

## 2023-09-03 DIAGNOSIS — I10 Essential (primary) hypertension: Secondary | ICD-10-CM

## 2023-09-03 DIAGNOSIS — I2510 Atherosclerotic heart disease of native coronary artery without angina pectoris: Secondary | ICD-10-CM

## 2023-09-03 NOTE — Telephone Encounter (Signed)
 Requested Prescriptions  Pending Prescriptions Disp Refills   carvedilol (COREG) 25 MG tablet [Pharmacy Med Name: Carvedilol 25 MG Oral Tablet] 180 tablet 0    Sig: TAKE 1 TABLET BY MOUTH TWICE DAILY WITH A MEAL     Cardiovascular: Beta Blockers 3 Passed - 09/03/2023  4:59 PM      Passed - Cr in normal range and within 360 days    Creat  Date Value Ref Range Status  05/23/2023 0.95 0.70 - 1.35 mg/dL Final   Creatinine, Urine  Date Value Ref Range Status  10/07/2022 80 20 - 320 mg/dL Final         Passed - AST in normal range and within 360 days    AST  Date Value Ref Range Status  05/23/2023 22 10 - 35 U/L Final   SGOT(AST)  Date Value Ref Range Status  02/27/2013 40 (H) 15 - 37 Unit/L Final         Passed - ALT in normal range and within 360 days    ALT  Date Value Ref Range Status  05/23/2023 27 9 - 46 U/L Final   SGPT (ALT)  Date Value Ref Range Status  02/27/2013 73 12 - 78 U/L Final         Passed - Last BP in normal range    BP Readings from Last 1 Encounters:  06/02/23 134/76         Passed - Last Heart Rate in normal range    Pulse Readings from Last 1 Encounters:  06/02/23 85         Passed - Valid encounter within last 6 months    Recent Outpatient Visits           3 months ago Annual physical exam   [place] [facility] [place], Netta Neat, DO   11 months ago Type 2 diabetes mellitus with other specified complication, without long-term current use of insulin [facility])   [place] [facility] [place], Netta Neat, DO   1 year ago Annual physical exam   [place] [facility] Smitty Cords, DO   1 year ago Chronic myofascial pain   [place] [facility] [place], Netta Neat, DO   1 year ago Type 2 diabetes mellitus with other specified complication, without long-term current use of insulin [facility])   [place] [facility] [place], Netta Neat, DO       Future Appointments             In 2 weeks Althea Charon, Netta Neat, DO  [facility], [facility]

## 2023-09-23 ENCOUNTER — Ambulatory Visit: Payer: Self-pay | Admitting: Family Medicine

## 2023-09-26 ENCOUNTER — Encounter: Payer: Self-pay | Admitting: Family Medicine

## 2023-09-26 ENCOUNTER — Ambulatory Visit (INDEPENDENT_AMBULATORY_CARE_PROVIDER_SITE_OTHER): Admitting: Family Medicine

## 2023-09-26 VITALS — BP 124/68 | HR 85 | Ht 69.0 in | Wt 169.0 lb

## 2023-09-26 DIAGNOSIS — I10 Essential (primary) hypertension: Secondary | ICD-10-CM

## 2023-09-26 DIAGNOSIS — E1169 Type 2 diabetes mellitus with other specified complication: Secondary | ICD-10-CM

## 2023-09-26 DIAGNOSIS — J449 Chronic obstructive pulmonary disease, unspecified: Secondary | ICD-10-CM | POA: Diagnosis not present

## 2023-09-26 DIAGNOSIS — I2510 Atherosclerotic heart disease of native coronary artery without angina pectoris: Secondary | ICD-10-CM | POA: Diagnosis not present

## 2023-09-26 DIAGNOSIS — Z7984 Long term (current) use of oral hypoglycemic drugs: Secondary | ICD-10-CM

## 2023-09-26 DIAGNOSIS — E785 Hyperlipidemia, unspecified: Secondary | ICD-10-CM

## 2023-09-26 LAB — POCT GLYCOSYLATED HEMOGLOBIN (HGB A1C): Hemoglobin A1C: 7.8 % — AB (ref 4.0–5.6)

## 2023-09-26 MED ORDER — ROSUVASTATIN CALCIUM 40 MG PO TABS: 40.0000 mg | ORAL_TABLET | Freq: Every day | ORAL | 3 refills | Status: AC

## 2023-09-26 MED ORDER — JARDIANCE 25 MG PO TABS: 25.0000 mg | ORAL_TABLET | Freq: Every day | ORAL | 3 refills | Status: AC

## 2023-09-26 NOTE — Progress Notes (Signed)
 Subjective:    Patient ID: Clifford Morse, male    DOB: December 08, 1960, 64 y.o.   MRN: [id number]  Clifford Morse is a 63 y.o. male presenting on 09/26/2023 for Diabetes   HPI  Discussed the use of AI scribe software for clinical note transcription with the patient, who gave verbal consent to proceed.  History of Present Illness   Clifford Morse is a 63 year old male with type 2 diabetes mellitus who presents for follow-up on blood sugar management.  Gradually worsening Anxiety He reports experiencing increased anxiety since around Christmas, with episodes triggered by stressors such as upcoming appointments. He recalls using duloxetine for mood but does not remember specific anxiety medications. He is currently managing without additional medication but is open to revisiting treatment if symptoms worsen.  Past history failure bupropion  Chronic pain syndrome Chronic Myofascial pain R Hip Pain Low Back Pain  Tried Gabapentin 600mg  nightly, Flexeril 10mg  TID PRN, Duloxetine 60mg   Chronic problem with R hip pain  Chronic Hip / Knee pain Followed now by [facility] for pain management Mid back pain, chronic similar to last time, prolong sitting, and then goes to move active lifting turning twisting etc   CHRONIC HTN: Controlled Current Meds - Carvedilol 25mg  BID, Lisinopril-HCTZ 10-12.5mg    Reports good compliance, took meds today. Tolerating well, w/o complaints. Denies CP, dyspnea, HA, edema, dizziness / lightheadedness   DM, Type 2 / Possible neuropathy complication A1c down to 7.8, improvement from >10 CBGs: reviewed Meds: Jardiance 25mg  daily, Metformin XR 750mg  TWICE a day with meal Currently on ACEi already Lifestyle: - Diet (still improving low carb options) Request copy of last Diabetic Eye Exam Summer 2024 History of occasional foot stinging symptoms Denies hypoglycemia, polyuria, visual changes, numbness or tingling.   HYPERLIPIDEMIA: - Reports concerns. Last lipid  panel with elevated TG, cardiology raised his Statin therapy from 20 to 40mg  dose Rosuvastatin - Currently taking Rosuvastatin 40mg , tolerating well without side effects or myalgias   Chronic Pain Syndrome Chronic Hip / Knee pain     CAD S/p Left Heart Cath by Dr Kirke Corin 06/19/20 He had another cardiac stent placed   Centrilobular Emphysema / COPD [facility] Pulmonology Dr Jayme Cloud Previously on Trelegy and then switched to trial on Breztri and did not gain benefit Lowest cost on Trelegy was $200 with financial assistance, he could not afford this Not on maintenance      09/26/2023    2:32 PM 05/26/2023    4:05 PM 10/07/2022    3:57 PM  Depression screen PHQ 2/9  Decreased Interest 2 2 3   Down, Depressed, Hopeless 1 2 1   PHQ - 2 Score 3 4 4   Altered sleeping 1 1 2   Tired, decreased energy 3 3 3   Change in appetite 2 3 1   Feeling bad or failure about yourself  1 1 0  Trouble concentrating 3 3 0  Moving slowly or fidgety/restless 1 0 0  Suicidal thoughts 0 1 2  PHQ-9 Score 14 16 12   Difficult doing work/chores Very difficult Somewhat difficult Not difficult at all       09/26/2023    2:33 PM 05/26/2023    4:05 PM 10/07/2022    3:57 PM 05/03/2022    8:56 AM  GAD 7 : Generalized Anxiety Score  Nervous, Anxious, on Edge 2 1 1 1   Control/stop worrying 1 1 1 1   Worry too much - different things 1 1 1 1   Trouble  relaxing 2 3 1 1   Restless 1 1 1 1   Easily annoyed or irritable 1 1 1 1   Afraid - awful might happen 2 0 1 1  Total GAD 7 Score 10 8 7 7   Anxiety Difficulty Very difficult Somewhat difficult Not difficult at all Somewhat difficult    Social History   Tobacco Use   Smoking status: Every Day    Current packs/day: 2.00    Average packs/day: 2.0 packs/day for 40.0 years (80.0 ttl pk-yrs)    Types: Cigarettes   Smokeless tobacco: Former   Tobacco comments:    1PPD   Vaping Use   Vaping status: Never Used  Substance Use Topics   Alcohol use: Yes     Alcohol/week: 0.0 standard drinks of alcohol    Comment: rare   Drug use: No    Review of Systems Per HPI unless specifically indicated above     Objective:    BP 124/68 (BP Location: Left Arm, Patient Position: Sitting, Cuff Size: Normal)   Pulse 85   Ht 5\' 9"  (1.753 m)   Wt 169 lb (76.7 kg)   SpO2 97%   BMI 24.96 kg/m   Wt Readings from Last 3 Encounters:  09/26/23 169 lb (76.7 kg)  06/02/23 174 lb 6.4 oz (79.1 kg)  05/26/23 176 lb (79.8 kg)    Physical Exam Vitals and nursing note reviewed.  Constitutional:      General: He is not in acute distress.    Appearance: He is well-developed. He is not diaphoretic.     Comments: Well-appearing, comfortable, cooperative  HENT:     Head: Normocephalic and atraumatic.  Eyes:     General:        Right eye: No discharge.        Left eye: No discharge.     Conjunctiva/sclera: Conjunctivae normal.  Neck:     Thyroid: No thyromegaly.  Cardiovascular:     Rate and Rhythm: Normal rate and regular rhythm.     Pulses: Normal pulses.     Heart sounds: Normal heart sounds. No murmur heard. Pulmonary:     Effort: Pulmonary effort is normal. No respiratory distress.     Breath sounds: Normal breath sounds. No wheezing or rales.  Musculoskeletal:        General: Normal range of motion.     Cervical back: Normal range of motion and neck supple.  Lymphadenopathy:     Cervical: No cervical adenopathy.  Skin:    General: Skin is warm and dry.     Findings: No erythema or rash.  Neurological:     Mental Status: He is alert and oriented to person, place, and time. Mental status is at baseline.  Psychiatric:        Behavior: Behavior normal.     Comments: Well groomed, good eye contact, normal speech and thoughts     I have personally reviewed the radiology report from 07/03/23 on LDCT.   CLINICAL DATA:  70 pack-year smoking history/current smoker.   EXAM: CT CHEST WITHOUT CONTRAST LOW-DOSE FOR LUNG CANCER SCREENING    TECHNIQUE: Multidetector CT imaging of the chest was performed following the standard protocol without IV contrast.   RADIATION DOSE REDUCTION: This exam was performed according to the departmental dose-optimization program which includes automated exposure control, adjustment of the mA and/or kV according to patient size and/or use of iterative reconstruction technique.   COMPARISON:  02/10/2013 diagnostic chest CT.  No prior screening CT.  FINDINGS: Cardiovascular: Aortic atherosclerosis. Tortuous descending thoracic aorta. Normal heart size, without pericardial effusion. Left main and 3 vessel coronary artery calcification.   Mediastinum/Nodes: No mediastinal or hilar adenopathy, given limitations of unenhanced CT.   Lungs/Pleura: No pleural fluid. Moderate centrilobular emphysema. Anteromedial inferior right upper lobe scarring.   Smoking related respiratory bronchiolitis. Left lower lobe scarring.   Left upper lobe pulmonary nodules of maximally 2.2 mm.   Upper Abdomen: Moderate hepatic steatosis. Caudate and lateral segment left liver lobe enlargement.   Suspect subtle gallstones including on 63/2. Normal imaged portions of the spleen, pancreas, adrenal glands, kidneys. Proximal gastric underdistention.   Musculoskeletal: No acute osseous abnormality.   IMPRESSION: 1. Lung-RADS 1, negative. Continue annual screening with low-dose chest CT without contrast in 12 months. 2. Hepatic steatosis. Possible mild cirrhosis. Correlate with risk factors. 3. Probable cholelithiasis. 4. Aortic Atherosclerosis (ICD10-I70.0) and Emphysema (ICD10-J43.9). Coronary artery atherosclerosis.     Electronically Signed   By: Jeronimo Greaves M.D.   On: 07/03/2023 14:10  Recent Labs    10/07/22 1621 05/23/23 0806 09/26/23 1438  HGBA1C 8.2* 10.7* 7.8*    Results for orders placed or performed in visit on 09/26/23  POCT HgB A1C   Collection Time: 09/26/23  2:38 PM  Result Value  Ref Range   Hemoglobin A1C 7.8 (A) 4.0 - 5.6 %   HbA1c POC (<> result, manual entry)     HbA1c, POC (prediabetic range)     HbA1c, POC (controlled diabetic range)        Assessment & Plan:   Problem List Items Addressed This Visit     Essential hypertension   Relevant Medications   rosuvastatin (CRESTOR) 40 MG tablet   Hyperlipidemia associated with type 2 diabetes mellitus (HCC)   Relevant Medications   rosuvastatin (CRESTOR) 40 MG tablet   JARDIANCE 25 MG TABS tablet   Stage 1 mild COPD by GOLD classification (HCC)   Type 2 diabetes mellitus with other specified complication (HCC) - Primary   Relevant Medications   rosuvastatin (CRESTOR) 40 MG tablet   JARDIANCE 25 MG TABS tablet   Other Relevant Orders   POCT HgB A1C (Completed)   Urine Microalbumin w/creat. ratio   Other Visit Diagnoses       Coronary artery disease involving native coronary artery of native heart without angina pectoris       Relevant Medications   rosuvastatin (CRESTOR) 40 MG tablet     Long term current use of oral hypoglycemic drug            Type 2 Diabetes Mellitus Improved glycemic control with reduced A1c 10.7 to 7.8. Tolerating Jardiance and metformin. Discussed Jardiance-related UTI risk. Insurance coverage for [employer] may vary. - Refill Jardiance 25mg  - Perform annual urine test for kidney function. / Urine microalbumin  Hyperlipidemia Controlled - Refill rosuvastatin 40 mg.  Chronic Obstructive Pulmonary Disease (COPD) Stage 1 mild COPD. No inhaler use needed currently. Improved on Trelegy but not covered - Consider inhaler if symptoms worsen. Reviewed high cost. Previously evaluated by clinical pharmacy  Fatty Liver Disease On imaging Mild fatty liver. No specific treatment needed beyond current management.  Anxiety Increased anxiety since Christmas. Managed with duloxetine. Prefers monitoring symptoms. - Monitor anxiety symptoms. Past failure on Wellbutrin. Consider Buspar  or other option - Consider treatment if panic attacks develop.        Orders Placed This Encounter  Procedures   Urine Microalbumin w/creat. ratio   POCT HgB A1C  Meds ordered this encounter  Medications   rosuvastatin (CRESTOR) 40 MG tablet    Sig: Take 1 tablet (40 mg total) by mouth daily.    Dispense:  90 tablet    Refill:  3   JARDIANCE 25 MG TABS tablet    Sig: Take 1 tablet (25 mg total) by mouth daily before breakfast.    Dispense:  90 tablet    Refill:  3    Follow up plan: Return in about 4 months (around 01/26/2024) for 4 month DM A1c, Anxiety, COPD Updates.   Saralyn Pilar, DO [facility] 09/26/2023, 2:54 PM

## 2023-09-26 NOTE — Patient Instructions (Addendum)
 Thank you for coming to the office today.  Recent Labs    10/07/22 1621 05/23/23 0806 09/26/23 1438  HGBA1C 8.2* 10.7* 7.8*   Contact back if need help with the anxiety  Great sugar result  Keep on medicines, refilled today Jardiance Rosuvastatin  Consider inhalers in future.   Please schedule a Follow-up Appointment to: Return in about 4 months (around 01/26/2024) for 4 month DM A1c, Anxiety, COPD Updates.  If you have any other questions or concerns, please feel free to call the office or send a message through MyChart. You may also schedule an earlier appointment if necessary.  Additionally, you may be receiving a survey about your experience at our office within a few days to 1 week by e-mail or mail. We value your feedback.  Saralyn Pilar, DO [facility], [place]

## 2023-09-27 LAB — MICROALBUMIN / CREATININE URINE RATIO
Creatinine, Urine: 61 mg/dL (ref 20–320)
Microalb Creat Ratio: 13 mg/g{creat} (ref <30)
Microalb, Ur: 0.8 mg/dL

## 2023-10-01 ENCOUNTER — Encounter: Payer: Self-pay | Admitting: Family Medicine

## 2023-11-12 ENCOUNTER — Other Ambulatory Visit: Payer: Self-pay | Admitting: Family Medicine

## 2023-11-12 DIAGNOSIS — G894 Chronic pain syndrome: Secondary | ICD-10-CM

## 2023-11-12 DIAGNOSIS — G8929 Other chronic pain: Secondary | ICD-10-CM

## 2023-11-13 NOTE — Telephone Encounter (Signed)
 Requested Prescriptions  Pending Prescriptions Disp Refills   gabapentin  (NEURONTIN ) 600 MG tablet [Pharmacy Med Name: GABAPENTIN  600MG     TAB] 90 tablet 0    Sig: TAKE 1 TABLET BY MOUTH AT BEDTIME     Neurology: Anticonvulsants - gabapentin  Failed - 11/13/2023  2:26 PM      Failed - Valid encounter within last 12 months    Recent Outpatient Visits           1 month ago Type 2 diabetes mellitus with other specified complication, without long-term current use of insulin [facility])   [place] [facility] [place], Kayleen Party, DO              Passed - Cr in normal range and within 360 days    Creat  Date Value Ref Range Status  05/23/2023 0.95 0.70 - 1.35 mg/dL Final   Creatinine, Urine  Date Value Ref Range Status  09/26/2023 61 20 - 320 mg/dL Final         Passed - Completed PHQ-2 or PHQ-9 in the last 360 days

## 2023-11-27 ENCOUNTER — Other Ambulatory Visit: Payer: Self-pay | Admitting: Family Medicine

## 2023-11-27 DIAGNOSIS — I2510 Atherosclerotic heart disease of native coronary artery without angina pectoris: Secondary | ICD-10-CM

## 2023-11-27 DIAGNOSIS — I10 Essential (primary) hypertension: Secondary | ICD-10-CM

## 2023-11-28 NOTE — Telephone Encounter (Signed)
 Requested Prescriptions  Pending Prescriptions Disp Refills   carvedilol  (COREG ) 25 MG tablet [Pharmacy Med Name: Carvedilol  25 MG Oral Tablet] 180 tablet 0    Sig: TAKE 1 TABLET BY MOUTH TWICE DAILY WITH A MEAL     Cardiovascular: Beta Blockers 3 Failed - 11/28/2023  4:55 PM      Failed - Valid encounter within last 6 months    Recent Outpatient Visits           2 months ago Type 2 diabetes mellitus with other specified complication, without long-term current use of insulin [facility])   [place] [facility] [place], Kayleen Party, DO              Passed - Cr in normal range and within 360 days    Creat  Date Value Ref Range Status  05/23/2023 0.95 0.70 - 1.35 mg/dL Final   Creatinine, Urine  Date Value Ref Range Status  09/26/2023 61 20 - 320 mg/dL Final         Passed - AST in normal range and within 360 days    AST  Date Value Ref Range Status  05/23/2023 22 10 - 35 U/L Final   SGOT(AST)  Date Value Ref Range Status  02/27/2013 40 (H) 15 - 37 Unit/L Final         Passed - ALT in normal range and within 360 days    ALT  Date Value Ref Range Status  05/23/2023 27 9 - 46 U/L Final   SGPT (ALT)  Date Value Ref Range Status  02/27/2013 73 12 - 78 U/L Final         Passed - Last BP in normal range    BP Readings from Last 1 Encounters:  09/26/23 124/68         Passed - Last Heart Rate in normal range    Pulse Readings from Last 1 Encounters:  09/26/23 85

## 2023-12-05 ENCOUNTER — Other Ambulatory Visit: Payer: Self-pay | Admitting: Family Medicine

## 2023-12-05 DIAGNOSIS — F5104 Psychophysiologic insomnia: Secondary | ICD-10-CM

## 2023-12-05 DIAGNOSIS — F331 Major depressive disorder, recurrent, moderate: Secondary | ICD-10-CM

## 2023-12-05 NOTE — Telephone Encounter (Signed)
 Requested Prescriptions  Pending Prescriptions Disp Refills   traZODone  (DESYREL ) 100 MG tablet [Pharmacy Med Name: traZODone  HCl 100 MG Oral Tablet] 90 tablet 0    Sig: TAKE 1 TABLET BY MOUTH AT BEDTIME     Psychiatry: Antidepressants - Serotonin Modulator Failed - 12/05/2023  4:34 PM      Failed - Valid encounter within last 6 months    Recent Outpatient Visits           2 months ago Type 2 diabetes mellitus with other specified complication, without long-term current use of insulin [facility])   [place] [facility] [place], Kayleen Party, DO              Passed - Completed PHQ-2 or PHQ-9 in the last 360 days

## 2023-12-11 ENCOUNTER — Other Ambulatory Visit: Payer: Self-pay | Admitting: Family Medicine

## 2023-12-11 DIAGNOSIS — E1169 Type 2 diabetes mellitus with other specified complication: Secondary | ICD-10-CM

## 2023-12-12 NOTE — Telephone Encounter (Signed)
 Requested Prescriptions  Pending Prescriptions Disp Refills   metFORMIN  (GLUCOPHAGE -XR) 750 MG 24 hr tablet [Pharmacy Med Name: metFORMIN  HCl ER 750 MG Oral Tablet Extended Release 24 Hour] 180 tablet 0    Sig: TAKE 1 TABLET BY MOUTH TWICE DAILY WITH A MEAL     Endocrinology:  Diabetes - Biguanides Failed - 12/12/2023  2:06 PM      Failed - B12 Level in normal range and within 720 days    No results found for: VITAMINB12       Failed - Valid encounter within last 6 months    Recent Outpatient Visits           2 months ago Type 2 diabetes mellitus with other specified complication, without long-term current use of insulin [facility])   [place] [facility] [place], Clifford Party, DO              Passed - Cr in normal range and within 360 days    Creat  Date Value Ref Range Status  05/23/2023 0.95 0.70 - 1.35 mg/dL Final   Creatinine, Urine  Date Value Ref Range Status  09/26/2023 61 20 - 320 mg/dL Final         Passed - HBA1C is between 0 and 7.9 and within 180 days    Hemoglobin A1C  Date Value Ref Range Status  09/26/2023 7.8 (A) 4.0 - 5.6 % Final  02/27/2013 5.9 4.2 - 6.3 % Final    Comment:    The American Diabetes Association recommends that a primary goal of therapy should be <7% and that physicians should reevaluate the treatment regimen in patients with HbA1c values consistently >8%.    Hgb A1c MFr Bld  Date Value Ref Range Status  05/23/2023 10.7 (H) <5.7 % of total Hgb Final    Comment:    For someone without known diabetes, a hemoglobin A1c value of 6.5% or greater indicates that they may have  diabetes and this should be confirmed with a follow-up  test. . For someone with known diabetes, a value <7% indicates  that their diabetes is well controlled and a value  greater than or equal to 7% indicates suboptimal  control. A1c targets should be individualized based on  duration of diabetes, age, comorbid conditions, and  other  considerations. . Currently, no consensus exists regarding use of hemoglobin A1c for diagnosis of diabetes for children. .          Passed - eGFR in normal range and within 360 days    GFR, Est African American  Date Value Ref Range Status  04/18/2020 113 > OR = 60 mL/min/1.68m2 Final   GFR calc Af Amer  Date Value Ref Range Status  05/22/2020 118 >59 mL/min/1.73 Final    Comment:    **In accordance with recommendations from the NKF-ASN Task force,**   [facility] is in the process of updating its eGFR calculation to the   2021 CKD-EPI creatinine equation that estimates kidney function   without a race variable.    GFR, Est Non African American  Date Value Ref Range Status  04/18/2020 97 > OR = 60 mL/min/1.30m2 Final   GFR, Estimated  Date Value Ref Range Status  06/20/2020 >60 >60 mL/min Final    Comment:    (NOTE) Calculated using the CKD-EPI Creatinine Equation (2021)    eGFR  Date Value Ref Range Status  05/23/2023 90 > OR = 60 mL/min/1.69m2 Final  Passed - CBC within normal limits and completed in the last 12 months    WBC  Date Value Ref Range Status  05/23/2023 9.7 3.8 - 10.8 Thousand/uL Final   RBC  Date Value Ref Range Status  05/23/2023 4.86 4.20 - 5.80 Million/uL Final   Hemoglobin  Date Value Ref Range Status  05/23/2023 15.0 13.2 - 17.1 g/dL Final  40/98/1191 47.8 13.0 - 17.7 g/dL Final   HCT  Date Value Ref Range Status  05/23/2023 44.5 38.5 - 50.0 % Final   Hematocrit  Date Value Ref Range Status  05/22/2020 41.4 37.5 - 51.0 % Final   MCHC  Date Value Ref Range Status  05/23/2023 33.7 32.0 - 36.0 g/dL Final    Comment:    For adults, a slight decrease in the calculated MCHC value (in the range of 30 to 32 g/dL) is most likely not clinically significant; however, it should be interpreted with caution in correlation with other red cell parameters and the patient's clinical condition.    [facility]  Date Value Ref Range Status   05/23/2023 30.9 27.0 - 33.0 pg Final   MCV  Date Value Ref Range Status  05/23/2023 91.6 80.0 - 100.0 fL Final  05/22/2020 88 79 - 97 fL Final  02/27/2013 91 80 - 100 fL Final   No results found for: PLTCOUNTKUC, LABPLAT, POCPLA RDW  Date Value Ref Range Status  05/23/2023 11.9 11.0 - 15.0 % Final  05/22/2020 11.7 11.6 - 15.4 % Final  02/27/2013 12.1 11.5 - 14.5 % Final

## 2024-01-04 ENCOUNTER — Other Ambulatory Visit: Payer: Self-pay | Admitting: Family Medicine

## 2024-01-04 DIAGNOSIS — F331 Major depressive disorder, recurrent, moderate: Secondary | ICD-10-CM

## 2024-01-06 NOTE — Telephone Encounter (Signed)
 Requested by interface surescripts. Future visit on 02/06/24.  Requested Prescriptions  Pending Prescriptions Disp Refills   DULoxetine  (CYMBALTA ) 60 MG capsule [Pharmacy Med Name: DULoxetine  HCl 60 MG Oral Capsule Delayed Release Particles] 90 capsule 0    Sig: Take 1 capsule by mouth once daily     Psychiatry: Antidepressants - SNRI - duloxetine  Passed - 01/06/2024  2:47 PM      Passed - Cr in normal range and within 360 days    Creat  Date Value Ref Range Status  05/23/2023 0.95 0.70 - 1.35 mg/dL Final   Creatinine, Urine  Date Value Ref Range Status  09/26/2023 61 20 - 320 mg/dL Final         Passed - eGFR is 30 or above and within 360 days    GFR, Est African American  Date Value Ref Range Status  04/18/2020 113 > OR = 60 mL/min/1.61m2 Final   GFR calc Af Amer  Date Value Ref Range Status  05/22/2020 118 >59 mL/min/1.73 Final    Comment:    **In accordance with recommendations from the NKF-ASN Task force,**   [facility] is in the process of updating its eGFR calculation to the   2021 CKD-EPI creatinine equation that estimates kidney function   without a race variable.    GFR, Est Non African American  Date Value Ref Range Status  04/18/2020 97 > OR = 60 mL/min/1.23m2 Final   GFR, Estimated  Date Value Ref Range Status  06/20/2020 >60 >60 mL/min Final    Comment:    (NOTE) Calculated using the CKD-EPI Creatinine Equation (2021)    eGFR  Date Value Ref Range Status  05/23/2023 90 > OR = 60 mL/min/1.53m2 Final         Passed - Completed PHQ-2 or PHQ-9 in the last 360 days      Passed - Last BP in normal range    BP Readings from Last 1 Encounters:  09/26/23 124/68         Passed - Valid encounter within last 6 months    Recent Outpatient Visits           3 months ago Type 2 diabetes mellitus with other specified complication, without long-term current use of insulin [facility])   [place] [facility] [place], Marsa PARAS, [place]

## 2024-01-30 ENCOUNTER — Other Ambulatory Visit: Payer: Self-pay | Admitting: Medical Genetics

## 2024-02-06 ENCOUNTER — Other Ambulatory Visit: Payer: Self-pay | Admitting: Family Medicine

## 2024-02-06 ENCOUNTER — Ambulatory Visit (INDEPENDENT_AMBULATORY_CARE_PROVIDER_SITE_OTHER): Admitting: Family Medicine

## 2024-02-06 VITALS — BP 122/78 | HR 84 | Ht 69.0 in | Wt 169.1 lb

## 2024-02-06 DIAGNOSIS — F331 Major depressive disorder, recurrent, moderate: Secondary | ICD-10-CM

## 2024-02-06 DIAGNOSIS — F5104 Psychophysiologic insomnia: Secondary | ICD-10-CM | POA: Diagnosis not present

## 2024-02-06 DIAGNOSIS — R41840 Attention and concentration deficit: Secondary | ICD-10-CM

## 2024-02-06 DIAGNOSIS — Z0000 Encounter for general adult medical examination without abnormal findings: Secondary | ICD-10-CM

## 2024-02-06 DIAGNOSIS — Z23 Encounter for immunization: Secondary | ICD-10-CM

## 2024-02-06 DIAGNOSIS — I2510 Atherosclerotic heart disease of native coronary artery without angina pectoris: Secondary | ICD-10-CM

## 2024-02-06 DIAGNOSIS — E1169 Type 2 diabetes mellitus with other specified complication: Secondary | ICD-10-CM | POA: Diagnosis not present

## 2024-02-06 DIAGNOSIS — I10 Essential (primary) hypertension: Secondary | ICD-10-CM

## 2024-02-06 DIAGNOSIS — J449 Chronic obstructive pulmonary disease, unspecified: Secondary | ICD-10-CM

## 2024-02-06 DIAGNOSIS — R351 Nocturia: Secondary | ICD-10-CM

## 2024-02-06 DIAGNOSIS — F419 Anxiety disorder, unspecified: Secondary | ICD-10-CM

## 2024-02-06 DIAGNOSIS — Z7984 Long term (current) use of oral hypoglycemic drugs: Secondary | ICD-10-CM

## 2024-02-06 DIAGNOSIS — Z1211 Encounter for screening for malignant neoplasm of colon: Secondary | ICD-10-CM

## 2024-02-06 LAB — POCT GLYCOSYLATED HEMOGLOBIN (HGB A1C): Hemoglobin A1C: 7.4 % — AB (ref 4.0–5.6)

## 2024-02-06 MED ORDER — TRAZODONE HCL 100 MG PO TABS
100.0000 mg | ORAL_TABLET | Freq: Every day | ORAL | 3 refills | Status: AC
Start: 2024-02-06 — End: ?

## 2024-02-06 MED ORDER — DULOXETINE HCL 60 MG PO CPEP: 60.0000 mg | ORAL_CAPSULE | Freq: Every day | ORAL | 3 refills | Status: AC

## 2024-02-06 NOTE — Patient Instructions (Addendum)
 Thank you for coming to the office today.  We will coordinate with our pharmacist to try to get you an inhaler for daily use. At lower cost.  Referral to a Therapist to discuss the focus inattention / anxiety / memory  Recent Labs    05/23/23 0806 09/26/23 1438 02/06/24 1438  HGBA1C 10.7* 7.8* 7.4*   Pneumonia shot today good for 5 years  Refills  DUE for FASTING BLOOD WORK (no food or drink after midnight before the lab appointment, only water or coffee without cream/sugar on the morning of)  SCHEDULE Lab Only visit in the morning at the clinic for lab draw in 3 MONTHS   - Make sure Lab Only appointment is at about 1 week before your next appointment, so that results will be available  For Lab Results, once available within 2-3 days of blood draw, you can can log in to MyChart online to view your results and a brief explanation. Also, we can discuss results at next follow-up visit.   Please schedule a Follow-up Appointment to: Return in about 3 months (around 05/08/2024) for 3 month fasting lab > 1 week later Annual Physical.  If you have any other questions or concerns, please feel free to call the office or send a message through MyChart. You may also schedule an earlier appointment if necessary.  Additionally, you may be receiving a survey about your experience at our office within a few days to 1 week by e-mail or mail. We value your feedback.  Marsa Officer, DO [facility], [place]

## 2024-02-06 NOTE — Progress Notes (Addendum)
 Subjective:    Patient ID: Clifford Morse, male    DOB: 06-19-1961, 63 y.o.   MRN: [id number]  Clifford Morse is a 63 y.o. male presenting on 02/06/2024 for Medical Management of Chronic Issues   HPI  Discussed the use of AI scribe software for clinical note transcription with the patient, who gave verbal consent to proceed.  History of Present Illness   Clifford Morse is a 63 year old male with type 2 diabetes and COPD who presents for a follow-up on blood sugar control and respiratory management.  Type 2 Diabetes - Type 2 diabetes with significant improvement in blood glucose control - Most recent hemoglobin A1c is 7.4, improved from 10.7 one year ago - Current regimen includes metformin  and Jardiance  - Metformin  adjusted to one tablet in the morning, which is effective - No new prescriptions required at this time  Chronic obstructive pulmonary disease (copd) symptoms and management - History of COPD - Previously used Trelegy but discontinued due to cost - Not using a rescue inhaler regularly and does not carry it, citing forgetfulness and lack of perceived need - Last lung scan performed in December of the previous year  Anxiety / Inattention Neuropsychiatric symptoms - Experiences anxiety with variable intensity - Current medications include duloxetine , gabapentin , and trazodone , which are helpful for symptom management - Difficulty with concentration and memory, described as 'hard to concentrate' and 'can't remember things'  Colorectal cancer screening - History of positive Cologuard test in 2021 - No follow-up colonoscopy due to scheduling difficulties and other commitments - Prefers to repeat Cologuard testing at home, as he declines Colonoscopy.      Chronic pain syndrome Chronic Myofascial pain R Hip Pain Low Back Pain  Tried Gabapentin  600mg  nightly, Flexeril  10mg  TID PRN, Duloxetine  60mg   Chronic problem with R hip pain  Chronic Hip / Knee pain Followed now by [facility]  Physiatry for pain management Mid back pain, chronic similar to last time, prolong sitting, and then goes to move active lifting turning twisting etc   CHRONIC HTN: Controlled Current Meds - Carvedilol  25mg  BID, Lisinopril -HCTZ 10-12.5mg    Reports good compliance, took meds today. Tolerating well, w/o complaints. Denies CP, dyspnea, HA, edema, dizziness / lightheadedness     HYPERLIPIDEMIA: - Reports concerns. Last lipid panel with elevated TG, cardiology raised his Statin therapy from 20 to 40mg  dose Rosuvastatin  - Currently taking Rosuvastatin  40mg , tolerating well without side effects or myalgias   Chronic Pain Syndrome Chronic Hip / Knee pain     CAD S/p Left Heart Cath by Dr Darron 06/19/20 He had another cardiac stent placed  Health Maintenance: Prevnar 20     02/06/2024    2:31 PM 09/26/2023    2:32 PM 05/26/2023    4:05 PM  Depression screen PHQ 2/9  Decreased Interest 2 2 2   Down, Depressed, Hopeless 2 1 2   PHQ - 2 Score 4 3 4   Altered sleeping 1 1 1   Tired, decreased energy 3 3 3   Change in appetite 2 2 3   Feeling bad or failure about yourself  1 1 1   Trouble concentrating 3 3 3   Moving slowly or fidgety/restless 0 1 0  Suicidal thoughts 0 0 1  PHQ-9 Score 14 14 16   Difficult doing work/chores Somewhat difficult Very difficult Somewhat difficult       02/06/2024    2:31 PM 09/26/2023    2:33 PM 05/26/2023    4:05 PM 10/07/2022    3:57 PM  GAD 7 : Generalized Anxiety Score  Nervous, Anxious, on Edge 2 2 1 1   Control/stop worrying 1 1 1 1   Worry too much - different things 1 1 1 1   Trouble relaxing 2 2 3 1   Restless 1 1 1 1   Easily annoyed or irritable 1 1 1 1   Afraid - awful might happen 1 2 0 1  Total GAD 7 Score 9 10 8 7   Anxiety Difficulty Somewhat difficult Very difficult Somewhat difficult Not difficult at all    Social History   Tobacco Use   Smoking status: Every Day    Current packs/day: 2.00    Average packs/day: 2.0 packs/day for 40.0  years (80.0 ttl pk-yrs)    Types: Cigarettes   Smokeless tobacco: Former   Tobacco comments:    1PPD   Vaping Use   Vaping status: Never Used  Substance Use Topics   Alcohol use: Yes    Alcohol/week: 0.0 standard drinks of alcohol    Comment: rare   Drug use: No    Review of Systems  Constitutional:  Negative for activity change, appetite change, chills, diaphoresis, fatigue and fever.  HENT:  Negative for congestion and hearing loss.   Eyes:  Negative for visual disturbance.  Respiratory:  Negative for cough, chest tightness, shortness of breath and wheezing.   Cardiovascular:  Negative for chest pain, palpitations and leg swelling.  Gastrointestinal:  Negative for abdominal pain, constipation, diarrhea, nausea and vomiting.  Genitourinary:  Negative for dysuria, frequency and hematuria.  Musculoskeletal:  Negative for arthralgias and neck pain.  Skin:  Negative for rash.  Neurological:  Negative for dizziness, weakness, light-headedness, numbness and headaches.  Hematological:  Negative for adenopathy.  Psychiatric/Behavioral:  Negative for behavioral problems, dysphoric mood and sleep disturbance.    Per HPI unless specifically indicated above     Objective:    BP 122/78 (BP Location: Left Arm, Cuff Size: Normal)   Pulse 84   Ht 5' 9 (1.753 m)   Wt 169 lb 2 oz (76.7 kg)   SpO2 97%   BMI 24.98 kg/m   Wt Readings from Last 3 Encounters:  02/06/24 169 lb 2 oz (76.7 kg)  09/26/23 169 lb (76.7 kg)  06/02/23 174 lb 6.4 oz (79.1 kg)    Physical Exam Vitals and nursing note reviewed.  Constitutional:      General: He is not in acute distress.    Appearance: He is well-developed. He is not diaphoretic.     Comments: Well-appearing, comfortable, cooperative  HENT:     Head: Normocephalic and atraumatic.  Eyes:     General:        Right eye: No discharge.        Left eye: No discharge.     Conjunctiva/sclera: Conjunctivae normal.     Pupils: Pupils are equal, round,  and reactive to light.  Neck:     Thyroid: No thyromegaly.     Vascular: No carotid bruit.  Cardiovascular:     Rate and Rhythm: Normal rate and regular rhythm.     Pulses: Normal pulses.     Heart sounds: Normal heart sounds. No murmur heard. Pulmonary:     Effort: Pulmonary effort is normal. No respiratory distress.     Breath sounds: Normal breath sounds. No wheezing or rales.  Abdominal:     General: Bowel sounds are normal. There is no distension.     Palpations: Abdomen is soft. There is no mass.  Tenderness: There is no abdominal tenderness.  Musculoskeletal:        General: No tenderness. Normal range of motion.     Cervical back: Normal range of motion and neck supple.     Right lower leg: No edema.     Left lower leg: No edema.     Comments: Upper / Lower Extremities: - Normal muscle tone, strength bilateral upper extremities 5/5, lower extremities 5/5  Lymphadenopathy:     Cervical: No cervical adenopathy.  Skin:    General: Skin is warm and dry.     Findings: No erythema or rash.  Neurological:     Mental Status: He is alert and oriented to person, place, and time.     Comments: Distal sensation intact to light touch all extremities  Psychiatric:        Mood and Affect: Mood normal.        Behavior: Behavior normal.        Thought Content: Thought content normal.     Comments: Well groomed, good eye contact, normal speech and thoughts     Results for orders placed or performed in visit on 02/06/24  POCT HgB A1C   Collection Time: 02/06/24  2:38 PM  Result Value Ref Range   Hemoglobin A1C 7.4 (A) 4.0 - 5.6 %   HbA1c POC (<> result, manual entry)     HbA1c, POC (prediabetic range)     HbA1c, POC (controlled diabetic range)        Assessment & Plan:   Problem List Items Addressed This Visit     Anxiety   Relevant Medications   DULoxetine  (CYMBALTA ) 60 MG capsule   traZODone  (DESYREL ) 100 MG tablet   Other Relevant Orders   Ambulatory referral to  Psychology   Major depressive disorder, recurrent, moderate (HCC)   Relevant Medications   DULoxetine  (CYMBALTA ) 60 MG capsule   traZODone  (DESYREL ) 100 MG tablet   Psychophysiological insomnia   Relevant Medications   traZODone  (DESYREL ) 100 MG tablet   Stage 1 mild COPD by GOLD classification (HCC)   Relevant Orders   AMB Referral VBCI Care Management   Type 2 diabetes mellitus with other specified complication (HCC) - Primary   Relevant Medications   metFORMIN  (GLUCOPHAGE -XR) 750 MG 24 hr tablet   Other Relevant Orders   POCT HgB A1C (Completed)   Other Visit Diagnoses       Need for Streptococcus pneumoniae vaccination       Relevant Orders   Pneumococcal conjugate vaccine 20-valent (Completed)     Screen for colon cancer       Relevant Orders   Cologuard     Long term current use of oral hypoglycemic drug         Inattention       Relevant Orders   Ambulatory referral to Psychology        Chronic obstructive pulmonary disease with emphysema Discontinued Trelegy due to cost. Tried Breztri . Not using daily maintenance inhaler. Has albuterol  but does not carry it regularly.  Annual lung scan planned. - Refer to clinical pharmacist for low-cost daily maintenance inhaler options. - Schedule annual lung scan.  Type 2 diabetes mellitus Improved glycemic control with A1c reduced to 7.4%. Continues metformin  and Jardiance  effectively. Reduced metformin  dosage maintained control. - Continue metformin  and Jardiance  as prescribed.  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 Reports concentration and memory issues possibly related to anxiety or inattention. -  Continue duloxetine , gabapentin , and trazodone . - Refer to mental health services for therapy and evaluation of memory and attention.  Colorectal cancer screening Screening due. Previous positive Cologuard in 2021. Opted for Cologuard  due to colonoscopy scheduling issues. - Order Cologuard test for home use.  General Health Maintenance Discussed need for updated pneumonia vaccination with Prevnar 20 for extended protection. - Administer Prevnar 20 vaccination.       Orders Placed This Encounter  Procedures   Pneumococcal conjugate vaccine 20-valent   Cologuard   AMB Referral VBCI Care Management    Referral Priority:   Routine    Referral Type:   Consultation    Referral Reason:   Care Coordination    Number of Visits Requested:   1   Ambulatory referral to Psychology    Referral Priority:   Routine    Referral Type:   Psychiatric    Referral Reason:   Specialty Services Required    Requested Specialty:   Psychology    Number of Visits Requested:   1   POCT HgB A1C    Meds ordered this encounter  Medications   DULoxetine  (CYMBALTA ) 60 MG capsule    Sig: Take 1 capsule (60 mg total) by mouth daily.    Dispense:  90 capsule    Refill:  3   traZODone  (DESYREL ) 100 MG tablet    Sig: Take 1 tablet (100 mg total) by mouth at bedtime.    Dispense:  90 tablet    Refill:  3    Follow up plan: Return in about 3 months (around 05/08/2024) for 3 month fasting lab > 1 week later Annual Physical.  Future labs ordered for 05/14/24   Marsa Officer, DO [facility] 02/06/2024, 2:55 PM

## 2024-02-09 ENCOUNTER — Other Ambulatory Visit: Payer: Self-pay | Admitting: Cardiology

## 2024-02-09 DIAGNOSIS — I10 Essential (primary) hypertension: Secondary | ICD-10-CM

## 2024-02-10 NOTE — Addendum Note (Signed)
 Addended by: EDMAN MARSA PARAS on: 02/10/2024 05:33 PM   Modules accepted: Orders

## 2024-02-18 ENCOUNTER — Encounter: Payer: Self-pay | Admitting: Pharmacist

## 2024-02-18 ENCOUNTER — Other Ambulatory Visit (INDEPENDENT_AMBULATORY_CARE_PROVIDER_SITE_OTHER): Admitting: Pharmacist

## 2024-02-18 ENCOUNTER — Other Ambulatory Visit (HOSPITAL_COMMUNITY): Payer: Self-pay

## 2024-02-18 ENCOUNTER — Telehealth: Payer: Self-pay

## 2024-02-18 DIAGNOSIS — E1169 Type 2 diabetes mellitus with other specified complication: Secondary | ICD-10-CM

## 2024-02-18 DIAGNOSIS — J449 Chronic obstructive pulmonary disease, unspecified: Secondary | ICD-10-CM

## 2024-02-18 MED ORDER — TRELEGY ELLIPTA 100-62.5-25 MCG/ACT IN AEPB: 1.0000 | INHALATION_SPRAY | Freq: Every day | RESPIRATORY_TRACT | 2 refills | Status: DC

## 2024-02-18 MED ORDER — ICOSAPENT ETHYL 1 G PO CAPS: 2.0000 g | ORAL_CAPSULE | Freq: Two times a day (BID) | ORAL | 2 refills | Status: AC

## 2024-02-18 NOTE — Telephone Encounter (Signed)
 Pharmacy Patient Advocate Encounter  Insurance verification completed.   The patient is insured through [employer] test claim for vascepa . Currently a quantity of 120 is a 30 day supply and the co-pay is $40 .   This test claim was processed through [facility]- copay amounts may vary at other pharmacies due to pharmacy/plan contracts, or as the patient moves through the different stages of their insurance plan.

## 2024-02-18 NOTE — Telephone Encounter (Signed)
 Copied from CRM [id number]. Topic: General - Other >> Feb 16, 2024 12:39 PM Zebedee SAUNDERS wrote: Reason for CRM: Received call from [facility] per [facility] ph: [phone] is scheduled for 02/18/2024 with [facility].

## 2024-02-18 NOTE — Progress Notes (Signed)
 02/18/2024 Name: Clifford Morse MRN: [id number] DOB: January 21, 1961  Chief Complaint  Patient presents with   Medication Assistance    Clifford Morse is a 63 y.o. year old male who was referred to the pharmacist by their PCP for assistance in managing medication access.   Reach patient only briefly today as he is getting ready to leave for work. Schedule appointment to follow up/complete medication review  Subjective:  Care Team: Primary Care Provider: Edman Marsa PARAS, DO ; Next Scheduled Visit: 05/21/2024 Cardiologist: Darliss Rogue, MD  Medication Access/Adherence  Current Pharmacy:  [facility] [address]), [place] - [address] [place]) [place] [address] Phone: [phone] Fax: [phone]   Patient reports affordability concerns with their medications: Yes  Patient reports access/transportation concerns to their pharmacy: No  Patient reports adherence concerns with their medications:  No    Requests help with cost of maintenance inhaler for his COPD and with cost of generic Vascepa  as previously prescribed by his Cardiologist  COPD:  Current medications: albuterol  inhaler - as needed  Medications tried in the past: Trelegy (cost); Breztri  (cost; did not find as effective)  Reports previously did well with Trelegy maintenance inhaler as provided by Pulmonologist Dr. Tamea in 2022 as sample, but was unable to afford this medication through his insurance. Patient requests help with restarting Trelegy inhaler    Objective:  Lab Results  Component Value Date   CREATININE 0.95 05/23/2023   BUN 13 05/23/2023   NA 135 05/23/2023   K 4.8 05/23/2023   CL 96 (L) 05/23/2023   CO2 27 05/23/2023    Lab Results  Component Value Date   CHOL 101 05/23/2023   HDL 33 (L) 05/23/2023   LDLCALC 42 05/23/2023   TRIG 187 (H) 05/23/2023   CHOLHDL 3.1 05/23/2023    Current Outpatient Medications on File  Prior to Visit  Medication Sig Dispense Refill   albuterol  (VENTOLIN  HFA) 108 (90 Base) MCG/ACT inhaler Inhale 1-2 puffs into the lungs every 6 (six) hours as needed for wheezing or shortness of breath.     aspirin  81 MG tablet Take 81 mg by mouth daily.     carvedilol  (COREG ) 25 MG tablet TAKE 1 TABLET BY MOUTH TWICE DAILY WITH A MEAL 180 tablet 0   clopidogrel  (PLAVIX ) 75 MG tablet Take 1 tablet (75 mg total) by mouth daily. 90 tablet 0   DULoxetine  (CYMBALTA ) 60 MG capsule Take 1 capsule (60 mg total) by mouth daily. 90 capsule 3   fenofibrate  160 MG tablet Take 1 tablet (160 mg total) by mouth daily. 90 tablet 3   gabapentin  (NEURONTIN ) 600 MG tablet TAKE 1 TABLET BY MOUTH AT BEDTIME 90 tablet 1   icosapent  Ethyl (VASCEPA ) 1 g capsule Take 2 capsules by mouth twice daily (Patient not taking: Reported on 02/06/2024) 120 capsule 6   JARDIANCE  25 MG TABS tablet Take 1 tablet (25 mg total) by mouth daily before breakfast. 90 tablet 3   lisinopril -hydrochlorothiazide  (ZESTORETIC ) 10-12.5 MG tablet Take 1 tablet by mouth once daily 90 tablet 0   metFORMIN  (GLUCOPHAGE -XR) 750 MG 24 hr tablet Take 1 tablet (750 mg total) by mouth daily with breakfast.     pantoprazole  (PROTONIX ) 40 MG tablet TAKE 1 TABLET BY MOUTH ONCE DAILY BEFORE BREAKFAST 30 tablet 11   pseudoephedrine -acetaminophen  (TYLENOL  SINUS) 30-500 MG TABS tablet Take 2 tablets by mouth 2 (two) times daily as needed (congestion).     rosuvastatin  (  CRESTOR ) 40 MG tablet Take 1 tablet (40 mg total) by mouth daily. 90 tablet 3   tadalafil  (CIALIS ) 20 MG tablet Take 1 tablet (20 mg total) by mouth every other day as needed for erectile dysfunction. 90 tablet 3   tiZANidine  (ZANAFLEX ) 4 MG tablet Take 1 tablet (4 mg total) by mouth every 8 (eight) hours as needed for muscle spasms. 90 tablet 5   traZODone  (DESYREL ) 100 MG tablet Take 1 tablet (100 mg total) by mouth at bedtime. 90 tablet 3   No current facility-administered medications on file  prior to visit.       Assessment/Plan:   CPP sends new prescription for Trelegy inhaler to [facility] for patient. Collaborate with PCP to send renewal of Vascepa  prescription to pharmacy for patient  Follow up with [facility] and provide Trelegy manufacturer savings card information: BIN#: [id number]  PCN#: [id number]  GRP#: [id number]  ID#: [id number]  Offer Expires: 06/30/2024   [facility] confirms that savings card brings cost of Trelegy inhaler to $0 copayment  Patient's copayment for generic Vascepa  prescription is $40/month.  Check for cost of brand name Vascepa  using manufacturer savings card, but generic copayment is lower  Follow up with patient to provide update  COPD: - Currently uncontrolled.  - Reviewed appropriate inhaler technique. As requested, will also send patient MyChart message with administration reminders/how to use video for Trelegy inhaler   Follow Up Plan: Clinical Pharmacist will follow up with patient by telephone on 02/20/2024 at 9:30 AM to complete medication review   Sharyle Sia, PharmD, JAQUELINE, CPP Clinical Pharmacist [facility] [phone]

## 2024-02-18 NOTE — Patient Instructions (Signed)
 It was a pleasure speaking with you today!   As discussed, we have sent a new prescription for your Trelegy inhaler to your pharmacy. With the manufacturer's savings card, your copayment should be $0/month.   For your Trelegy inhaler, please inhale 1 puff once daily. Please remember to rinse out your mouth with water and spit out after each use.   Before getting started back on this inhaler, please review the how to use video from the website below and let us  know if you have any questions:   [email or web address]-[email or web address]-[email or web address]   I look forward to speaking with you again on 02/20/2024 at 9:30 AM   Thank you!    Sharyle Sia, PharmD, JAQUELINE, CPP Clinical Pharmacist [facility] [phone]

## 2024-02-19 LAB — COLOGUARD: COLOGUARD: NEGATIVE

## 2024-02-20 ENCOUNTER — Ambulatory Visit: Payer: Self-pay | Admitting: Family Medicine

## 2024-02-20 ENCOUNTER — Other Ambulatory Visit (INDEPENDENT_AMBULATORY_CARE_PROVIDER_SITE_OTHER): Admitting: Pharmacist

## 2024-02-20 DIAGNOSIS — E1169 Type 2 diabetes mellitus with other specified complication: Secondary | ICD-10-CM

## 2024-02-20 DIAGNOSIS — Z7984 Long term (current) use of oral hypoglycemic drugs: Secondary | ICD-10-CM

## 2024-02-20 DIAGNOSIS — I10 Essential (primary) hypertension: Secondary | ICD-10-CM

## 2024-02-20 DIAGNOSIS — I2510 Atherosclerotic heart disease of native coronary artery without angina pectoris: Secondary | ICD-10-CM

## 2024-02-20 NOTE — Patient Instructions (Signed)
 The goal A1c is less than 7%. This is the best way to reduce the risk of the long term complications of diabetes, including heart disease, kidney disease, eye disease, strokes, and nerve damage. An A1c of less than 7% corresponds with fasting sugars less than 130 and 2 hour after meal sugars less than 180.   Check your blood pressure once weekly, and any time you have concerning symptoms like headache, chest pain, dizziness, shortness of breath, or vision changes.   Our goal is less than 130/80.  To appropriately check your blood pressure, make sure you do the following:  1) Avoid caffeine, exercise, or tobacco products for 30 minutes before checking. Empty your bladder. 2) Sit with your back supported in a flat-backed chair. Rest your arm on something flat (arm of the chair, table, etc). 3) Sit still with your feet flat on the floor, resting, for at least 5 minutes.  4) Check your blood pressure. Take 1-2 readings.  5) Write down these readings and bring with you to any provider appointments.  Bring your home blood pressure machine with you to a provider's office for accuracy comparison at least once a year.   Make sure you take your blood pressure medications before you come to any office visit, even if you were asked to fast for labs.  Sharyle Sia, PharmD, JAQUELINE, CPP Clinical Pharmacist [facility] [phone]

## 2024-02-20 NOTE — Progress Notes (Signed)
 02/20/2024 Name: Clifford Morse MRN: [id number] DOB: 14-Aug-1960  Chief Complaint  Patient presents with   Medication Assistance   Medication Management    Clifford Morse is a 63 y.o. year old male who presented for a telephone visit.   They were referred to the pharmacist by their PCP for assistance in managing medication access.    Subjective:  Care Team: Primary Care Provider: Edman Marsa PARAS, DO ; Next Scheduled Visit: 05/21/2024 Cardiologist: Darliss Rogue, MD  Medication Access/Adherence  Current Pharmacy:  [facility] [address]), [place] - [address] [place]) [place] [address] Phone: [phone] Fax: [phone]   Patient reports affordability concerns with their medications: Yes  Patient reports access/transportation concerns to their pharmacy: No  Patient reports adherence concerns with their medications:  No     Reports picked up his Trelegy inhaler last night, but has not yet picked up generic Vascepa    COPD:   Current medications:  - Trelegy 100-62.5-25 mcg/act - 1 puff daily - plans to restart today - albuterol  inhaler - as needed   Medications tried in the past: Breztri  (did not find as effective)   Current medication access support: manufacturer copay savings card for Trelegy  Hypertension:  Patient also [facility]  Current medications:  - carvedilol  25 mg twice daily - lisinopril -HCTZ 10-12.5 mg daily   Patient does not currently have a validated, automated [place], but shares that his wife has ordered a new one Denies checking home BP recently  Patient denies hypotensive s/sx including dizziness, lightheadedness.    Diabetes:  Current medications:  - Jardiance  25 mg daily - metformin  ER 750 mg daily with breakfast  Current glucose readings: recent morning fasting readings ranging:  104-111    Hyperlipidemia/ASCVD Risk Reduction  Patient also followed by [facility]  Current lipid lowering medications:  - rosuvastatin  40 mg daily - fenofibrate  160 mg daily - Reports has not yet restarted generic Vascepa     Antiplatelet regimen: clopidogrel  75 mg daily + aspirin  81 mg daily  ASCVD History: hx of MI/CAD (s/p DES x 2 - mid LAD 03/2013, mid to distal LAD stent 06/19/2020)    Objective:  Lab Results  Component Value Date   HGBA1C 7.4 (A) 02/06/2024    Lab Results  Component Value Date   CREATININE 0.95 05/23/2023   BUN 13 05/23/2023   NA 135 05/23/2023   K 4.8 05/23/2023   CL 96 (L) 05/23/2023   CO2 27 05/23/2023    Lab Results  Component Value Date   CHOL 101 05/23/2023   HDL 33 (L) 05/23/2023   LDLCALC 42 05/23/2023   TRIG 187 (H) 05/23/2023   CHOLHDL 3.1 05/23/2023   BP Readings from Last 3 Encounters:  02/06/24 122/78  09/26/23 124/68  06/02/23 134/76   Pulse Readings from Last 3 Encounters:  02/06/24 84  09/26/23 85  06/02/23 85     Medications Reviewed Today     Reviewed by Alana Sharyle LABOR, RPH-CPP (Pharmacist) on 02/20/24 at [phone]  Med List Status: <None>   Medication Order Taking? Sig Documenting Provider Last Dose Status Informant  albuterol  (VENTOLIN  HFA) 108 (90 Base) MCG/ACT inhaler [id number] Yes Inhale 1-2 puffs into the lungs every 6 (six) hours as needed for wheezing or shortness of breath. [provider]  Active   aspirin  81 MG tablet [id number] Yes Take 81 mg by mouth daily. [provider]  Active Self  carvedilol  (COREG ) 25 MG tablet [id number] Yes TAKE 1 TABLET BY MOUTH TWICE DAILY WITH A MEAL Karamalegos, Marsa PARAS, DO  Active   clopidogrel  (PLAVIX ) 75 MG tablet [id number] Yes Take 1 tablet (75 mg total) by mouth daily. Darliss Rogue, MD  Active   DULoxetine  (CYMBALTA ) 60 MG capsule [id number] Yes Take 1 capsule (60 mg total) by mouth daily. Edman Marsa PARAS, DO  Active    fenofibrate  160 MG tablet [id number] Yes Take 1 tablet (160 mg total) by mouth daily. Darliss Rogue, MD  Active   Fluticasone -Umeclidin-Vilant (TRELEGY ELLIPTA ) 100-62.5-25 MCG/ACT AEPB [id number]  Inhale 1 puff into the lungs daily. Edman Marsa PARAS, DO  Active   gabapentin  (NEURONTIN ) 600 MG tablet [id number] Yes TAKE 1 TABLET BY MOUTH AT BEDTIME  Patient taking differently: Take 600 mg by mouth 2 (two) times daily.   Edman Marsa PARAS, DO  Active   icosapent  Ethyl (VASCEPA ) 1 g capsule [id number]  Take 2 capsules (2 g total) by mouth 2 (two) times daily. Edman Marsa PARAS, DO  Active   JARDIANCE  25 MG TABS tablet [id number] Yes Take 1 tablet (25 mg total) by mouth daily before breakfast. Edman Marsa PARAS, DO  Active   lisinopril -hydrochlorothiazide  (ZESTORETIC ) 10-12.5 MG tablet [id number] Yes Take 1 tablet by mouth once daily Darliss Rogue, MD  Active   metFORMIN  (GLUCOPHAGE -XR) 750 MG 24 hr tablet [id number] Yes Take 1 tablet (750 mg total) by mouth daily with breakfast. Edman Marsa PARAS, DO  Active   pantoprazole  (PROTONIX ) 40 MG tablet [id number] Yes TAKE 1 TABLET BY MOUTH ONCE DAILY BEFORE BREAKFAST Karamalegos, Marsa PARAS, DO  Active   pseudoephedrine -acetaminophen  (TYLENOL  SINUS) 30-500 MG TABS tablet [id number] Yes Take 2 tablets by mouth 2 (two) times daily as needed (congestion). [provider]  Active Self  rosuvastatin  (CRESTOR ) 40 MG tablet [id number] Yes Take 1 tablet (40 mg total) by mouth daily. Edman Marsa PARAS, DO  Active   tadalafil  (CIALIS ) 20 MG tablet [id number] Yes Take 1 tablet (20 mg total) by mouth every other day as needed for erectile dysfunction. Edman Marsa PARAS, DO  Active   tiZANidine  (ZANAFLEX ) 4 MG tablet [id number] Yes Take 1 tablet (4 mg total) by mouth every 8 (eight) hours as needed for muscle spasms. Edman Marsa PARAS, DO  Active   traZODone  (DESYREL ) 100 MG tablet [id number] Yes Take 1  tablet (100 mg total) by mouth at bedtime. Edman Marsa PARAS, DO  Active               Assessment/Plan:   COPD: - Patient plans to restart maintenance inhaler, Trelegy 1 puff daily, today - Reviewed appropriate inhaler technique. Have also sent patient MyChart message with administration reminders/how to use video for Trelegy inhaler   Diabetes: - Reviewed long term cardiovascular and renal outcomes of uncontrolled blood sugar - Reviewed goal A1c, goal fasting, and goal 2 hour post prandial glucose - Reviewed dietary modifications including importance of having regular well-balanced meals and snacks throughout the day, while controlling carbohydrate portion sizes - Recommend to check glucose, keep log of results and have this record to review at upcoming medical appointments. Patient to contact provider office sooner if needed for readings outside of established parameters or symptoms  Hypertension: - Currently controlled - Reviewed long term cardiovascular and renal outcomes of uncontrolled blood pressure - Recommended to check home blood pressure and heart rate, keep log of results and have this record to review at upcoming medical  appointments. Patient to contact provider office sooner if needed for readings outside of established parameters or symptoms    Hyperlipidemia/ASCVD Risk Reduction: - Reviewed long term complications of uncontrolled cholesterol - Encourage patient to pick up generic Vascepa  prescription from pharmacy and restart taking    Follow Up Plan:   Patient denies further medication questions or concerns today Provide patient with contact information for clinic pharmacist to contact if needed in future for medication questions/concerns    Sharyle Sia, PharmD, JAQUELINE, CPP Clinical Pharmacist [facility] [phone]

## 2024-03-12 ENCOUNTER — Other Ambulatory Visit: Payer: Self-pay | Admitting: Family Medicine

## 2024-03-12 DIAGNOSIS — E1169 Type 2 diabetes mellitus with other specified complication: Secondary | ICD-10-CM

## 2024-03-12 NOTE — Telephone Encounter (Signed)
 Requested Prescriptions  Pending Prescriptions Disp Refills   metFORMIN  (GLUCOPHAGE -XR) 750 MG 24 hr tablet [Pharmacy Med Name: metFORMIN  HCl ER 750 MG Oral Tablet Extended Release 24 Hour] 180 tablet 0    Sig: TAKE 1 TABLET BY MOUTH TWICE DAILY WITH A MEAL     Endocrinology:  Diabetes - Biguanides Failed - 03/12/2024  3:35 PM      Failed - B12 Level in normal range and within 720 days    No results found for: VITAMINB12       Passed - Cr in normal range and within 360 days    Creat  Date Value Ref Range Status  05/23/2023 0.95 0.70 - 1.35 mg/dL Final   Creatinine, Urine  Date Value Ref Range Status  09/26/2023 61 20 - 320 mg/dL Final         Passed - HBA1C is between 0 and 7.9 and within 180 days    Hemoglobin A1C  Date Value Ref Range Status  02/06/2024 7.4 (A) 4.0 - 5.6 % Final  02/27/2013 5.9 4.2 - 6.3 % Final    Comment:    The American Diabetes Association recommends that a primary goal of therapy should be <7% and that physicians should reevaluate the treatment regimen in patients with HbA1c values consistently >8%.    Hgb A1c MFr Bld  Date Value Ref Range Status  05/23/2023 10.7 (H) <5.7 % of total Hgb Final    Comment:    For someone without known diabetes, a hemoglobin A1c value of 6.5% or greater indicates that they may have  diabetes and this should be confirmed with a follow-up  test. . For someone with known diabetes, a value <7% indicates  that their diabetes is well controlled and a value  greater than or equal to 7% indicates suboptimal  control. A1c targets should be individualized based on  duration of diabetes, age, comorbid conditions, and  other considerations. . Currently, no consensus exists regarding use of hemoglobin A1c for diagnosis of diabetes for children. .          Passed - eGFR in normal range and within 360 days    GFR, Est African American  Date Value Ref Range Status  04/18/2020 113 > OR = 60 mL/min/1.32m2 Final   GFR calc  Af Amer  Date Value Ref Range Status  05/22/2020 118 >59 mL/min/1.73 Final    Comment:    **In accordance with recommendations from the NKF-ASN Task force,**   [facility] is in the process of updating its eGFR calculation to the   2021 CKD-EPI creatinine equation that estimates kidney function   without a race variable.    GFR, Est Non African American  Date Value Ref Range Status  04/18/2020 97 > OR = 60 mL/min/1.48m2 Final   GFR, Estimated  Date Value Ref Range Status  06/20/2020 >60 >60 mL/min Final    Comment:    (NOTE) Calculated using the CKD-EPI Creatinine Equation (2021)    eGFR  Date Value Ref Range Status  05/23/2023 90 > OR = 60 mL/min/1.67m2 Final         Passed - Valid encounter within last 6 months    Recent Outpatient Visits           1 month ago Type 2 diabetes mellitus with other specified complication, without long-term current use of insulin [facility])   [place] [facility] [place], Marsa PARAS, DO   5 months ago Type 2 diabetes mellitus with other  specified complication, without long-term current use of insulin [facility])   [place] [facility] [place], Marsa PARAS, DO              Passed - CBC within normal limits and completed in the last 12 months    WBC  Date Value Ref Range Status  05/23/2023 9.7 3.8 - 10.8 Thousand/uL Final   RBC  Date Value Ref Range Status  05/23/2023 4.86 4.20 - 5.80 Million/uL Final   Hemoglobin  Date Value Ref Range Status  05/23/2023 15.0 13.2 - 17.1 g/dL Final  88/77/7978 85.2 13.0 - 17.7 g/dL Final   HCT  Date Value Ref Range Status  05/23/2023 44.5 38.5 - 50.0 % Final   Hematocrit  Date Value Ref Range Status  05/22/2020 41.4 37.5 - 51.0 % Final   MCHC  Date Value Ref Range Status  05/23/2023 33.7 32.0 - 36.0 g/dL Final    Comment:    For adults, a slight decrease in the calculated MCHC value (in the range of 30 to 32 g/dL) is most likely not clinically  significant; however, it should be interpreted with caution in correlation with other red cell parameters and the patient's clinical condition.    [facility]  Date Value Ref Range Status  05/23/2023 30.9 27.0 - 33.0 pg Final   MCV  Date Value Ref Range Status  05/23/2023 91.6 80.0 - 100.0 fL Final  05/22/2020 88 79 - 97 fL Final  02/27/2013 91 80 - 100 fL Final   No results found for: PLTCOUNTKUC, LABPLAT, POCPLA RDW  Date Value Ref Range Status  05/23/2023 11.9 11.0 - 15.0 % Final  05/22/2020 11.7 11.6 - 15.4 % Final  02/27/2013 12.1 11.5 - 14.5 % Final

## 2024-03-21 IMAGING — DX DG LUMBAR SPINE COMPLETE 4+V
5 series · 5 of 5 positions shown · non-contrast
Comparison: None Available.

CLINICAL DATA: Chronic bilateral low back pain without sciatica.
Chronic low back pain. History of lumbar degenerative disc.

EXAM:
LUMBAR SPINE - COMPLETE 4+ VIEW

[l-spine ap]
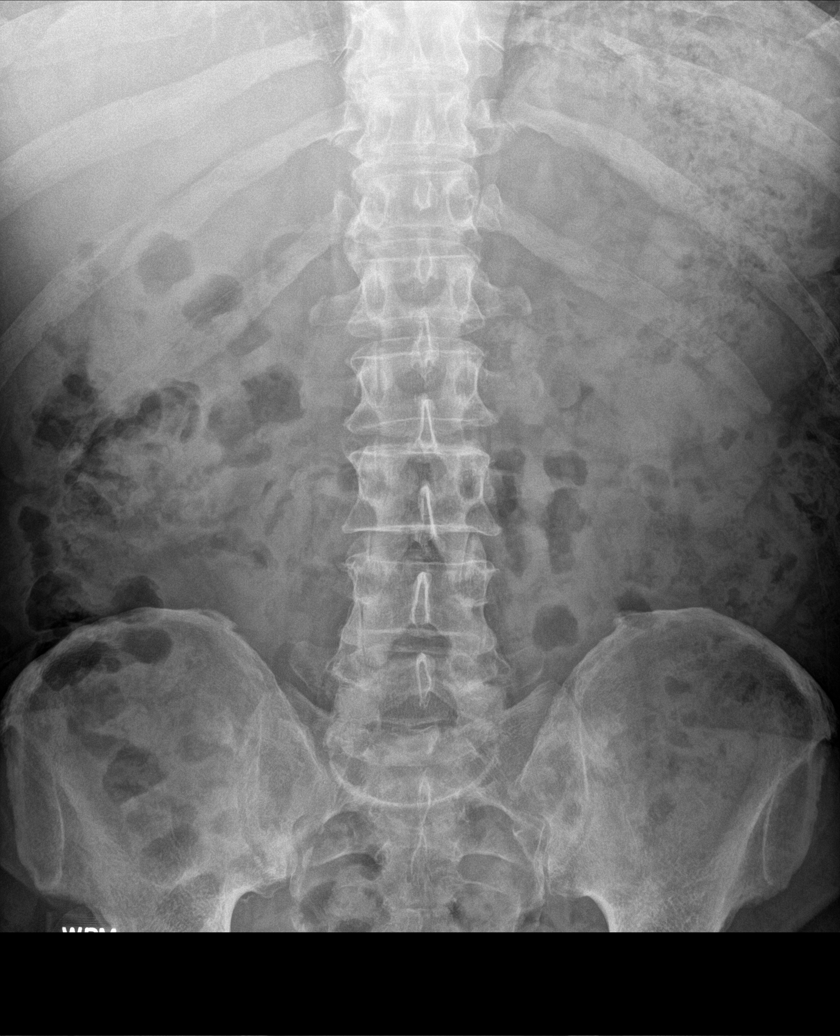

[l-spine obl (1 of 2)]
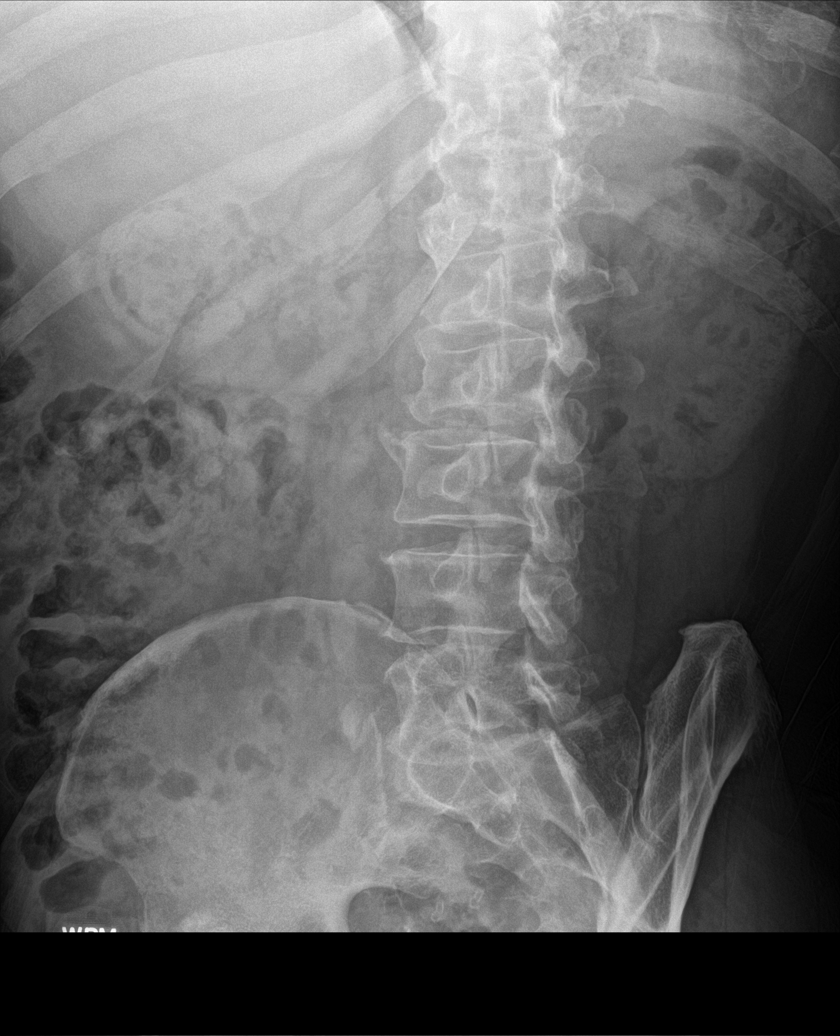

[l-spine obl (2 of 2)]
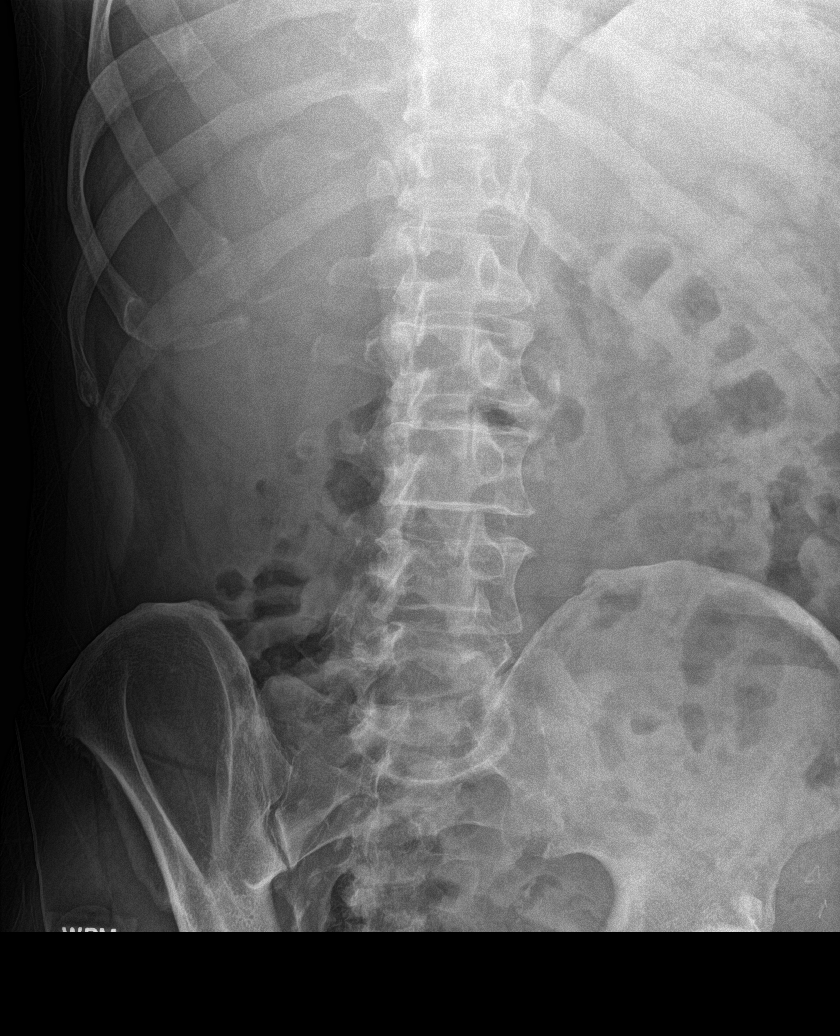

[l-spine lat]
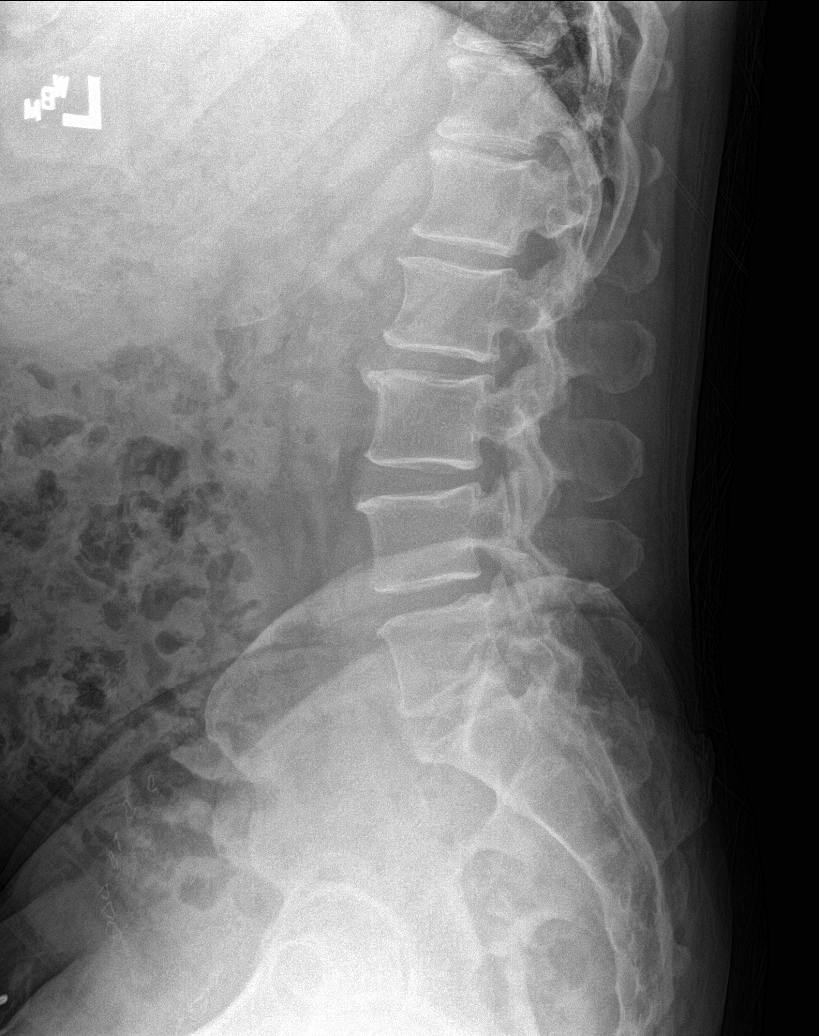

[l-spine spot]
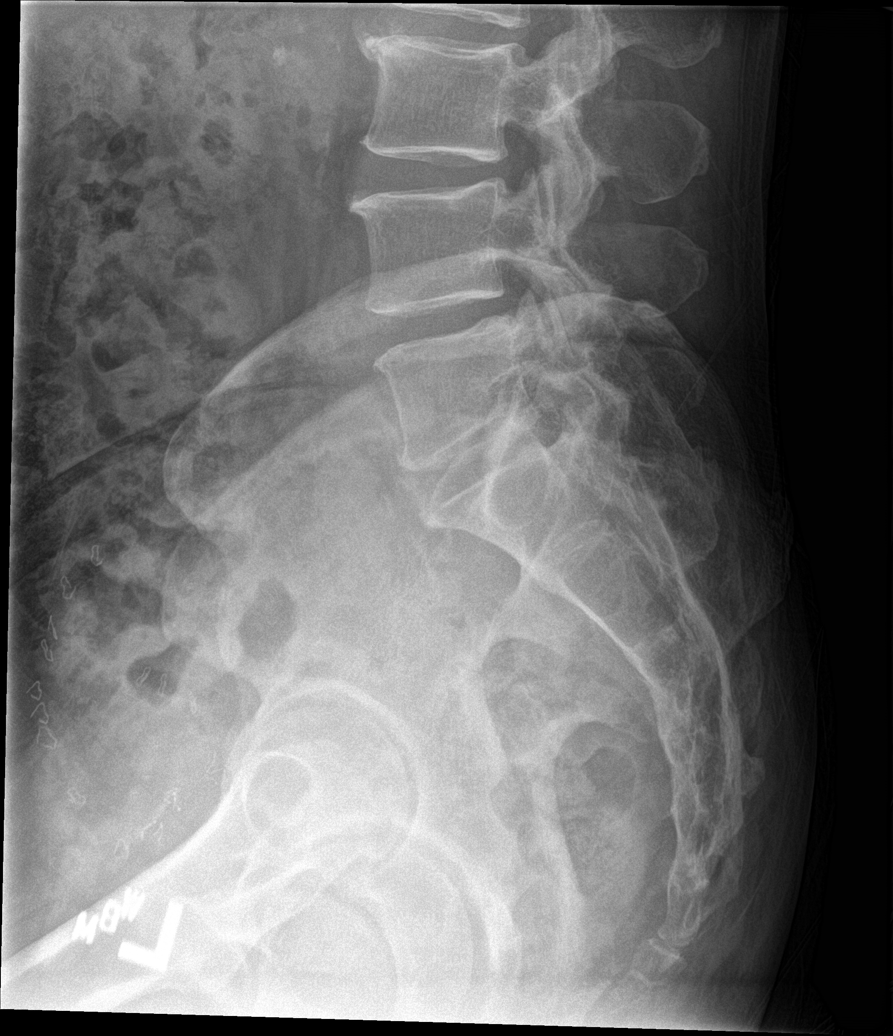

[5 of 5 positions shown; findings below may reference images not displayed]

FINDINGS: Five non-rib-bearing lumbar vertebra. Normal alignment. Normal
vertebral body heights. Mild multilevel anterior spurring. Slight
disc space narrowing at L2-L3, L4-L5 and L5-S1. There is minimal
L5-S1 facet hypertrophy. No evidence of fracture, focal bone
abnormality or pars defects. Sacroiliac joints are congruent.
IMPRESSION: 1. Mild multilevel degenerative disc disease.
2. Mild lower lumbar facet hypertrophy.

## 2024-04-26 ENCOUNTER — Other Ambulatory Visit: Payer: Self-pay | Admitting: Medical Genetics

## 2024-04-26 DIAGNOSIS — Z006 Encounter for examination for normal comparison and control in clinical research program: Secondary | ICD-10-CM

## 2024-05-07 ENCOUNTER — Other Ambulatory Visit: Payer: Self-pay | Admitting: Cardiology

## 2024-05-07 DIAGNOSIS — I10 Essential (primary) hypertension: Secondary | ICD-10-CM

## 2024-05-08 ENCOUNTER — Other Ambulatory Visit: Payer: Self-pay | Admitting: Family Medicine

## 2024-05-08 DIAGNOSIS — J449 Chronic obstructive pulmonary disease, unspecified: Secondary | ICD-10-CM

## 2024-05-08 NOTE — Telephone Encounter (Signed)
 Requested medication (s) are due for refill today: Yes  Requested medication (s) are on the active medication list: Yes  Last refill:  02/18/24  Future visit scheduled: Yes  Notes to clinic:  Unable to refill due to no refill protocol for this medication.      Requested Prescriptions  Pending Prescriptions Disp Refills   TRELEGY ELLIPTA  100-62.5-25 MCG/ACT AEPB [Pharmacy Med Name: Trelegy Ellipta  100-62.5-25 MCG/ACT Inhalation Aerosol Powder Breath Activated] 60 each 0    Sig: INHALE 1 PUFF ONCE DAILY     Off-Protocol Failed - 05/08/2024 11:22 AM      Failed - Medication not assigned to a protocol, review manually.      Passed - Valid encounter within last 12 months    Recent Outpatient Visits           3 months ago Type 2 diabetes mellitus with other specified complication, without long-term current use of insulin [facility])   [place] [facility] [place], Marsa PARAS, DO   7 months ago Type 2 diabetes mellitus with other specified complication, without long-term current use of insulin [facility])   [place] [facility] [place], Marsa PARAS, [place]

## 2024-05-14 ENCOUNTER — Other Ambulatory Visit

## 2024-05-14 DIAGNOSIS — R351 Nocturia: Secondary | ICD-10-CM

## 2024-05-14 DIAGNOSIS — F331 Major depressive disorder, recurrent, moderate: Secondary | ICD-10-CM

## 2024-05-14 DIAGNOSIS — E1169 Type 2 diabetes mellitus with other specified complication: Secondary | ICD-10-CM

## 2024-05-14 DIAGNOSIS — I2510 Atherosclerotic heart disease of native coronary artery without angina pectoris: Secondary | ICD-10-CM

## 2024-05-14 DIAGNOSIS — I10 Essential (primary) hypertension: Secondary | ICD-10-CM

## 2024-05-14 DIAGNOSIS — J449 Chronic obstructive pulmonary disease, unspecified: Secondary | ICD-10-CM

## 2024-05-14 DIAGNOSIS — Z0000 Encounter for general adult medical examination without abnormal findings: Secondary | ICD-10-CM

## 2024-05-15 LAB — MICROALBUMIN / CREATININE URINE RATIO
Creatinine, Urine: 72 mg/dL (ref 20–320)
Microalb Creat Ratio: 10 mg/g{creat} (ref <30)
Microalb, Ur: 0.7 mg/dL

## 2024-05-15 LAB — CBC WITH DIFFERENTIAL/PLATELET
Absolute Lymphocytes: 2912 {cells}/uL (ref 850–3900)
Absolute Monocytes: 572 {cells}/uL (ref 200–950)
Basophils Absolute: 104 {cells}/uL (ref 0–200)
Basophils Relative: 1 %
Eosinophils Absolute: 270 {cells}/uL (ref 15–500)
Eosinophils Relative: 2.6 %
HCT: 44.4 % (ref 38.5–50.0)
Hemoglobin: 15 g/dL (ref 13.2–17.1)
MCH: 31.1 pg (ref 27.0–33.0)
MCHC: 33.8 g/dL (ref 32.0–36.0)
MCV: 92.1 fL (ref 80.0–100.0)
MPV: 9.2 fL (ref 7.5–12.5)
Monocytes Relative: 5.5 %
Neutro Abs: 6542 {cells}/uL (ref 1500–7800)
Neutrophils Relative %: 62.9 %
Platelets: 343 Thousand/uL (ref 140–400)
RBC: 4.82 Million/uL (ref 4.20–5.80)
RDW: 12.3 % (ref 11.0–15.0)
Total Lymphocyte: 28 %
WBC: 10.4 Thousand/uL (ref 3.8–10.8)

## 2024-05-15 LAB — COMPREHENSIVE METABOLIC PANEL WITH GFR
AG Ratio: 1.9 (calc) (ref 1.0–2.5)
ALT: 16 U/L (ref 9–46)
AST: 19 U/L (ref 10–35)
Albumin: 4.6 g/dL (ref 3.6–5.1)
Alkaline phosphatase (APISO): 52 U/L (ref 35–144)
BUN: 13 mg/dL (ref 7–25)
CO2: 27 mmol/L (ref 20–32)
Calcium: 9.6 mg/dL (ref 8.6–10.3)
Chloride: 100 mmol/L (ref 98–110)
Creat: 0.78 mg/dL (ref 0.70–1.35)
Globulin: 2.4 g/dL (ref 1.9–3.7)
Glucose, Bld: 121 mg/dL — ABNORMAL HIGH (ref 65–99)
Potassium: 5 mmol/L (ref 3.5–5.3)
Sodium: 136 mmol/L (ref 135–146)
Total Bilirubin: 0.5 mg/dL (ref 0.2–1.2)
Total Protein: 7 g/dL (ref 6.1–8.1)
eGFR: 100 mL/min/1.73m2 (ref >=60)

## 2024-05-15 LAB — TSH: TSH: 0.7 m[IU]/L (ref 0.40–4.50)

## 2024-05-15 LAB — LIPID PANEL
Cholesterol: 113 mg/dL (ref <200)
HDL: 42 mg/dL (ref >=40)
LDL Cholesterol (Calc): 48 mg/dL
Non-HDL Cholesterol (Calc): 71 mg/dL (ref <130)
Total CHOL/HDL Ratio: 2.7 (calc) (ref <5.0)
Triglycerides: 156 mg/dL — ABNORMAL HIGH (ref <150)

## 2024-05-15 LAB — HEMOGLOBIN A1C
Hgb A1c MFr Bld: 7.4 % — ABNORMAL HIGH (ref <5.7)
Mean Plasma Glucose: 166 mg/dL
eAG (mmol/L): 9.2 mmol/L

## 2024-05-15 LAB — PSA: PSA: 0.91 ng/mL (ref <=4.00)

## 2024-05-19 ENCOUNTER — Other Ambulatory Visit: Payer: Self-pay | Admitting: Family Medicine

## 2024-05-19 DIAGNOSIS — I2510 Atherosclerotic heart disease of native coronary artery without angina pectoris: Secondary | ICD-10-CM

## 2024-05-19 DIAGNOSIS — I10 Essential (primary) hypertension: Secondary | ICD-10-CM

## 2024-05-21 ENCOUNTER — Encounter: Admitting: Family Medicine

## 2024-05-21 NOTE — Telephone Encounter (Signed)
 Requested Prescriptions  Pending Prescriptions Disp Refills   carvedilol  (COREG ) 25 MG tablet [Pharmacy Med Name: Carvedilol  25 MG Oral Tablet] 180 tablet 0    Sig: TAKE 1 TABLET BY MOUTH TWICE DAILY WITH A MEAL     Cardiovascular: Beta Blockers 3 Passed - 05/21/2024  1:07 PM      Passed - Cr in normal range and within 360 days    Creat  Date Value Ref Range Status  05/14/2024 0.78 0.70 - 1.35 mg/dL Final   Creatinine, Urine  Date Value Ref Range Status  05/14/2024 72 20 - 320 mg/dL Final         Passed - AST in normal range and within 360 days    AST  Date Value Ref Range Status  05/14/2024 19 10 - 35 U/L Final   SGOT(AST)  Date Value Ref Range Status  02/27/2013 40 (H) 15 - 37 Unit/L Final         Passed - ALT in normal range and within 360 days    ALT  Date Value Ref Range Status  05/14/2024 16 9 - 46 U/L Final   SGPT (ALT)  Date Value Ref Range Status  02/27/2013 73 12 - 78 U/L Final         Passed - Last BP in normal range    BP Readings from Last 1 Encounters:  02/06/24 122/78         Passed - Last Heart Rate in normal range    Pulse Readings from Last 1 Encounters:  02/06/24 84         Passed - Valid encounter within last 6 months    Recent Outpatient Visits           3 months ago Type 2 diabetes mellitus with other specified complication, without long-term current use of insulin [facility])   [place] [facility] [place], Marsa PARAS, DO   7 months ago Type 2 diabetes mellitus with other specified complication, without long-term current use of insulin [facility])   [place] [facility] [place], Marsa PARAS, [place]

## 2024-05-30 ENCOUNTER — Other Ambulatory Visit: Payer: Self-pay | Admitting: Cardiology

## 2024-06-07 ENCOUNTER — Other Ambulatory Visit: Payer: Self-pay | Admitting: Cardiology

## 2024-06-09 ENCOUNTER — Other Ambulatory Visit: Payer: Self-pay | Admitting: Family Medicine

## 2024-06-09 DIAGNOSIS — E1169 Type 2 diabetes mellitus with other specified complication: Secondary | ICD-10-CM

## 2024-06-10 NOTE — Telephone Encounter (Signed)
 Requested Prescriptions  Pending Prescriptions Disp Refills   metFORMIN  (GLUCOPHAGE -XR) 750 MG 24 hr tablet [Pharmacy Med Name: metFORMIN  HCl ER 750 MG Oral Tablet Extended Release 24 Hour] 180 tablet 0    Sig: TAKE 1 TABLET BY MOUTH TWICE DAILY WITH A MEAL     Endocrinology:  Diabetes - Biguanides Failed - 06/10/2024  3:59 PM      Failed - B12 Level in normal range and within 720 days    No results found for: VITAMINB12       Passed - Cr in normal range and within 360 days    Creat  Date Value Ref Range Status  05/14/2024 0.78 0.70 - 1.35 mg/dL Final   Creatinine, Urine  Date Value Ref Range Status  05/14/2024 72 20 - 320 mg/dL Final         Passed - HBA1C is between 0 and 7.9 and within 180 days    Hemoglobin A1C  Date Value Ref Range Status  02/27/2013 5.9 4.2 - 6.3 % Final    Comment:    The American Diabetes Association recommends that a primary goal of therapy should be <7% and that physicians should reevaluate the treatment regimen in patients with HbA1c values consistently >8%.    Hgb A1c MFr Bld  Date Value Ref Range Status  05/14/2024 7.4 (H) <5.7 % Final    Comment:    For someone without known diabetes, a hemoglobin A1c value of 6.5% or greater indicates that they may have  diabetes and this should be confirmed with a follow-up  test. . For someone with known diabetes, a value <7% indicates  that their diabetes is well controlled and a value  greater than or equal to 7% indicates suboptimal  control. A1c targets should be individualized based on  duration of diabetes, age, comorbid conditions, and  other considerations. . Currently, no consensus exists regarding use of hemoglobin A1c for diagnosis of diabetes for children. .          Passed - eGFR in normal range and within 360 days    GFR, Est African American  Date Value Ref Range Status  04/18/2020 113 > OR = 60 mL/min/1.80m2 Final   GFR calc Af Amer  Date Value Ref Range Status  05/22/2020  118 >59 mL/min/1.73 Final    Comment:    **In accordance with recommendations from the NKF-ASN Task force,**   [facility] is in the process of updating its eGFR calculation to the   2021 CKD-EPI creatinine equation that estimates kidney function   without a race variable.    GFR, Est Non African American  Date Value Ref Range Status  04/18/2020 97 > OR = 60 mL/min/1.34m2 Final   GFR, Estimated  Date Value Ref Range Status  06/20/2020 >60 >60 mL/min Final    Comment:    (NOTE) Calculated using the CKD-EPI Creatinine Equation (2021)    eGFR  Date Value Ref Range Status  05/14/2024 100 > OR = 60 mL/min/1.61m2 Final         Passed - Valid encounter within last 6 months    Recent Outpatient Visits           4 months ago Type 2 diabetes mellitus with other specified complication, without long-term current use of insulin [facility])   [facility] [place], Marsa PARAS, DO   8 months ago Type 2 diabetes mellitus with other specified complication, without long-term current use of insulin (HCC)   [facility] [place], Marsa PARAS, DO       Future Appointments             Tomorrow Vivienne, Lonni Ingle, NP [facility] - CBC within normal limits and completed in the last 12 months    WBC  Date Value Ref Range Status  05/14/2024 10.4 3.8 - 10.8 Thousand/uL Final   RBC  Date Value Ref Range Status  05/14/2024 4.82 4.20 - 5.80 Million/uL Final   Hemoglobin  Date Value Ref Range Status  05/14/2024 15.0 13.2 - 17.1 g/dL Final  88/77/7978 85.2 13.0 - 17.7 g/dL Final   HCT  Date Value Ref Range Status  05/14/2024 44.4 38.5 - 50.0 % Final   Hematocrit  Date Value Ref Range Status  05/22/2020 41.4 37.5 - 51.0 % Final   MCHC  Date Value Ref Range Status  05/14/2024 33.8 32.0 - 36.0 g/dL Final    Comment:    For adults, a slight decrease in the calculated MCHC value (in  the range of 30 to 32 g/dL) is most likely not clinically significant; however, it should be interpreted with caution in correlation with other red cell parameters and the patient's clinical condition.    [facility]  Date Value Ref Range Status  05/14/2024 31.1 27.0 - 33.0 pg Final   MCV  Date Value Ref Range Status  05/14/2024 92.1 80.0 - 100.0 fL Final  05/22/2020 88 79 - 97 fL Final  02/27/2013 91 80 - 100 fL Final   No results found for: PLTCOUNTKUC, LABPLAT, POCPLA RDW  Date Value Ref Range Status  05/14/2024 12.3 11.0 - 15.0 % Final  05/22/2020 11.7 11.6 - 15.4 % Final  02/27/2013 12.1 11.5 - 14.5 % Final

## 2024-06-11 ENCOUNTER — Telehealth: Payer: Self-pay | Admitting: Pharmacy Technician

## 2024-06-11 ENCOUNTER — Encounter: Payer: Self-pay | Admitting: Family Medicine

## 2024-06-11 ENCOUNTER — Ambulatory Visit: Admitting: Family Medicine

## 2024-06-11 ENCOUNTER — Encounter: Payer: Self-pay | Admitting: Nurse Practitioner

## 2024-06-11 ENCOUNTER — Other Ambulatory Visit: Payer: Self-pay | Admitting: Family Medicine

## 2024-06-11 ENCOUNTER — Ambulatory Visit: Attending: Nurse Practitioner | Admitting: Nurse Practitioner

## 2024-06-11 VITALS — BP 122/62 | HR 68 | Ht 69.0 in | Wt 170.8 lb

## 2024-06-11 VITALS — BP 130/82 | HR 82 | Ht 69.0 in | Wt 170.4 lb

## 2024-06-11 DIAGNOSIS — E1159 Type 2 diabetes mellitus with other circulatory complications: Secondary | ICD-10-CM | POA: Diagnosis not present

## 2024-06-11 DIAGNOSIS — I739 Peripheral vascular disease, unspecified: Secondary | ICD-10-CM

## 2024-06-11 DIAGNOSIS — N529 Male erectile dysfunction, unspecified: Secondary | ICD-10-CM

## 2024-06-11 DIAGNOSIS — F419 Anxiety disorder, unspecified: Secondary | ICD-10-CM | POA: Diagnosis not present

## 2024-06-11 DIAGNOSIS — Z7984 Long term (current) use of oral hypoglycemic drugs: Secondary | ICD-10-CM | POA: Diagnosis not present

## 2024-06-11 DIAGNOSIS — E1169 Type 2 diabetes mellitus with other specified complication: Secondary | ICD-10-CM

## 2024-06-11 DIAGNOSIS — I2510 Atherosclerotic heart disease of native coronary artery without angina pectoris: Secondary | ICD-10-CM | POA: Diagnosis not present

## 2024-06-11 DIAGNOSIS — Z0000 Encounter for general adult medical examination without abnormal findings: Secondary | ICD-10-CM | POA: Diagnosis not present

## 2024-06-11 DIAGNOSIS — J449 Chronic obstructive pulmonary disease, unspecified: Secondary | ICD-10-CM

## 2024-06-11 DIAGNOSIS — F331 Major depressive disorder, recurrent, moderate: Secondary | ICD-10-CM

## 2024-06-11 DIAGNOSIS — I10 Essential (primary) hypertension: Secondary | ICD-10-CM

## 2024-06-11 DIAGNOSIS — I25118 Atherosclerotic heart disease of native coronary artery with other forms of angina pectoris: Secondary | ICD-10-CM | POA: Diagnosis not present

## 2024-06-11 DIAGNOSIS — Z720 Tobacco use: Secondary | ICD-10-CM

## 2024-06-11 DIAGNOSIS — Z01810 Encounter for preprocedural cardiovascular examination: Secondary | ICD-10-CM | POA: Diagnosis not present

## 2024-06-11 DIAGNOSIS — E113299 Type 2 diabetes mellitus with mild nonproliferative diabetic retinopathy without macular edema, unspecified eye: Secondary | ICD-10-CM | POA: Diagnosis not present

## 2024-06-11 DIAGNOSIS — E782 Mixed hyperlipidemia: Secondary | ICD-10-CM | POA: Diagnosis not present

## 2024-06-11 DIAGNOSIS — E1149 Type 2 diabetes mellitus with other diabetic neurological complication: Secondary | ICD-10-CM

## 2024-06-11 DIAGNOSIS — N486 Induration penis plastica: Secondary | ICD-10-CM

## 2024-06-11 MED ORDER — RANOLAZINE ER 500 MG PO TB12: 500.0000 mg | ORAL_TABLET | Freq: Two times a day (BID) | ORAL | 3 refills | Status: AC

## 2024-06-11 NOTE — Progress Notes (Signed)
 Subjective:    Patient ID: Clifford Morse, male    DOB: [birth year], 63 y.o.   MRN: [id number]  Clifford Morse is a 63 y.o. male presenting on 06/11/2024 for Annual Exam   HPI  Discussed the use of AI scribe software for clinical note transcription with the patient, who gave verbal consent to proceed.  History of Present Illness   Clifford Morse is a 63 year old male with diabetes, hyperlipidemia, and angina who presents for a routine follow-up visit.   Peripheral neuropathy and musculoskeletal pain - Intermittent foot pain, numbness, and tingling. - Uncertain if symptoms are related to diabetes or lower back pain. - Lower back pain present. - Uses ibuprofen for pain relief, which improves sleep. - Takes gabapentin  and duloxetine  for nerve pain management.   HYPERLIPIDEMIA: - Reports no concerns. 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 - Takes metformin  750 mg extended release once daily in the morning and Jardiance . - Previously took two doses of metformin  but reduced to one after starting Jardiance .   Chronic obstructive pulmonary disease with emphysema Improved on Trelegy now low cost w/ copay per Clinical Pharmacy   Major Depression recurrent moderate Anxiety / Inattention Neuropsychiatric symptoms On medication management Last visit referred to Psychology. - Attending counseling sessions at [facility]. However feels limited benefit - No significant change in mental health symptoms since last visit.  - Current medications include duloxetine , gabapentin , and trazodone , which are helpful for symptom management - Difficulty with concentration and memory, described as 'hard to concentrate' and 'can't remember things'    Colorectal cancer screening - History of positive Cologuard test in 2021 - No follow-up colonoscopy due to scheduling difficulties and other commitments - Prefers to repeat Cologuard testing at home, as he declines Colonoscopy.    Chronic pain syndrome Chronic Myofascial pain R Hip Pain Low Back Pain  Tried Gabapentin  600mg  nightly, Flexeril  10mg  TID PRN, Duloxetine  60mg   Chronic problem with R hip pain  Chronic Hip / Knee pain Followed now by [facility] for pain management Mid back pain, chronic similar to last time, prolong sitting, and then goes to move active lifting turning twisting etc   CHRONIC HTN: Controlled Current Meds - Carvedilol  25mg  BID, Lisinopril -HCTZ 10-12.5mg    Reports good compliance, took meds today. Tolerating well, w/o complaints. Denies CP, dyspnea, HA, edema, dizziness / lightheadedness   CAD History of MI S/p Left Heart Cath by Dr Darron 06/19/20 Angina and chest pain - History of angina with recent cardiology evaluation. - Prescribed Ranexa  for chest pain but has not yet started the medication.  Erectile Dysfunction  Erectile dysfunction - Uses Cialis  but finds it insufficient due to increased penile curvature and decreased effectiveness. - Seeking referral to urology for further evaluation.   Health Maintenance:  PSA 0.91 negative  Decline Flu Vaccine  Prevnar-20 last visit 01/2024  Future Shingrix, considering  Cologuard 02/13/24 negative, repeat 3 years     06/11/2024    3:05 PM 02/06/2024    2:31 PM 09/26/2023    2:32 PM  Depression screen PHQ 2/9  Decreased Interest 1 2 2   Down, Depressed, Hopeless 1 2 1   PHQ - 2 Score 2 4 3   Altered sleeping 3 1 1   Tired, decreased  energy 1 3 3   Change in appetite 2 2 2   Feeling bad or failure about yourself  1 1 1   Trouble concentrating 3 3 3   Moving slowly or fidgety/restless 0 0 1  Suicidal thoughts 1 0 0  PHQ-9 Score 13 14  14    Difficult doing work/chores Somewhat difficult Somewhat  difficult Very difficult     Data saved with a previous flowsheet row definition       06/11/2024    3:06 PM 02/06/2024    2:31 PM 09/26/2023    2:33 PM 05/26/2023    4:05 PM  GAD 7 : Generalized Anxiety Score  Nervous, Anxious, on Edge 1 2 2 1   Control/stop worrying 1 1 1 1   Worry too much - different things 1 1 1 1   Trouble relaxing 1 2 2 3   Restless 1 1 1 1   Easily annoyed or irritable 1 1 1 1   Afraid - awful might happen 1 1 2  0  Total GAD 7 Score 7 9 10 8   Anxiety Difficulty Somewhat difficult Somewhat difficult Very difficult Somewhat difficult     Past Medical History:  Diagnosis Date   Allergy    Aortic atherosclerosis    Arthritis    Asthma    CAD (coronary artery disease)    a. 03/2013 MI/PCI: DES->mLAD; b. 05/2020 MV: mid ant/apical defect/partially reversible; c. 05/2020 PCI: LM nl, LAD 30p, 48m ISR, 8m/d (2.75x18 Resolute Onyx DES), 80d (small), Lat D2 99 (small), LCX 30diffuse, RCA 30p/m. EF 55-65%; d. 10/2021 MV: partially rev apical ant defect (scar/peri-infarct isch) - sl less pronounced compared to prior study. Low risk.   Diabetes mellitus, type 2 (HCC)    GERD (gastroesophageal reflux disease)    Hepatic steatosis    a. 07/2023 noted on CT.   History of echocardiogram    a. 05/2020 Echo: EF 60-65%, no rwma, nl RV fxn.   Hyperlipidemia    Intermittent claudication    Primary hypertension    Tobacco abuse    Vertigo    with sinus issues   Past Surgical History:  Procedure Laterality Date   CARDIAC CATHETERIZATION  2014   stent   CORONARY STENT INTERVENTION  2014   CORONARY STENT INTERVENTION N/A 06/19/2020   Procedure: CORONARY STENT INTERVENTION;  Surgeon: Darron Deatrice LABOR, MD;  Location: [facility] INVASIVE CV LAB;  Service: Cardiovascular;  Laterality: N/A;   ESOPHAGOGASTRODUODENOSCOPY     HERNIA REPAIR     LEFT HEART CATH AND CORONARY ANGIOGRAPHY Left 06/19/2020   Procedure: LEFT HEART CATH AND CORONARY ANGIOGRAPHY;  Surgeon: Darron Deatrice LABOR, MD;   Location: [facility] INVASIVE CV LAB;  Service: Cardiovascular;  Laterality: Left;   Social History   Socioeconomic History   Marital status: Married    Spouse name: Clarita    Number of children: 2   Years of education: Not on file   Highest education level: GED or equivalent  Occupational History   Not on file  Tobacco Use   Smoking status: Every Day    Current packs/day: 2.00    Average packs/day: 2.0 packs/day for 40.0 years (80.0 ttl pk-yrs)    Types: Cigarettes   Smokeless tobacco: Former   Tobacco comments:    1PPD   Vaping Use   Vaping status: Never Used  Substance and Sexual Activity   Alcohol use: Yes    Alcohol/week: 0.0 standard drinks of alcohol    Comment: rare   Drug use: No   Sexual activity: Not  on file  Other Topics Concern   Not on file  Social History Narrative   Lives at home with wife   Social Drivers of Health   Tobacco Use: High Risk (06/11/2024)   Patient History    Smoking Tobacco Use: Every Day    Smokeless Tobacco Use: Former    Passive Exposure: Not on Actuary Strain: Low Risk (06/11/2024)   Overall Financial Resource Strain (CARDIA)    Difficulty of Paying Living Expenses: Not very hard  Food Insecurity: No Food Insecurity (06/11/2024)   Epic    Worried About Radiation Protection Practitioner of Food in the Last Year: Never true    Ran Out of Food in the Last Year: Never true  Transportation Needs: No Transportation Needs (06/11/2024)   Epic    Lack of Transportation (Medical): No    Lack of Transportation (Non-Medical): No  Physical Activity: Inactive (06/11/2024)   Exercise Vital Sign    Days of Exercise per Week: 0 days    Minutes of Exercise per Session: Not on file  Stress: Stress Concern Present (06/11/2024)   [employer] of Occupational Health - Occupational Stress Questionnaire    Feeling of Stress: To some extent  Social Connections: Moderately Isolated (06/11/2024)   Social Connection and Isolation Panel    Frequency of  Communication with Friends and Family: Three times a week    Frequency of Social Gatherings with Friends and Family: Once a week    Attends Religious Services: Never    Database Administrator or Organizations: No    Attends Engineer, Structural: Not on file    Marital Status: Married  Catering Manager Violence: Not At Risk (05/26/2023)   Humiliation, Afraid, Rape, and Kick questionnaire    Fear of Current or Ex-Partner: No    Emotionally Abused: No    Physically Abused: No    Sexually Abused: No  Depression (PHQ2-9): High Risk (06/11/2024)   Depression (PHQ2-9)    PHQ-2 Score: 13  Alcohol Screen: Low Risk (06/11/2024)   Alcohol Screen    Last Alcohol Screening Score (AUDIT): 1  Housing: Unknown (06/11/2024)   Epic    Unable to Pay for Housing in the Last Year: No    Number of Times Moved in the Last Year: Not on file    Homeless in the Last Year: No  Utilities: Not At Risk (04/03/2022)   A[facility] Utilities    Threatened with loss of utilities: No  Health Literacy: Adequate Health Literacy (05/26/2023)   B1300 Health Literacy    Frequency of need for help with medical instructions: Rarely   Family History  Problem Relation Age of Onset   Emphysema Father    Diabetes Mother    Current Outpatient Medications on File Prior to Visit  Medication Sig   albuterol  (VENTOLIN  HFA) 108 (90 Base) MCG/ACT inhaler Inhale 1-2 puffs into the lungs every 6 (six) hours as needed for wheezing or shortness of breath.   aspirin  81 MG tablet Take 81 mg by mouth daily.   carvedilol  (COREG ) 25 MG tablet TAKE 1 TABLET BY MOUTH TWICE DAILY WITH A MEAL   clopidogrel  (PLAVIX ) 75 MG tablet Take 1 tablet by mouth once daily   DULoxetine  (CYMBALTA ) 60 MG capsule Take 1 capsule (60 mg total) by mouth daily.   fenofibrate  160 MG tablet Take 1 tablet by mouth once daily   gabapentin  (NEURONTIN ) 600 MG tablet TAKE 1 TABLET BY MOUTH AT BEDTIME (Patient taking differently: Take 600  mg by mouth 2 (two) times  daily.)   icosapent  Ethyl (VASCEPA ) 1 g capsule Take 2 capsules (2 g total) by mouth 2 (two) times daily.   JARDIANCE  25 MG TABS tablet Take 1 tablet (25 mg total) by mouth daily before breakfast.   lisinopril -hydrochlorothiazide  (ZESTORETIC ) 10-12.5 MG tablet Take 1 tablet by mouth once daily   metFORMIN  (GLUCOPHAGE -XR) 750 MG 24 hr tablet TAKE 1 TABLET BY MOUTH TWICE DAILY WITH A MEAL   pantoprazole  (PROTONIX ) 40 MG tablet TAKE 1 TABLET BY MOUTH ONCE DAILY BEFORE BREAKFAST   pseudoephedrine -acetaminophen  (TYLENOL  SINUS) 30-500 MG TABS tablet Take 2 tablets by mouth 2 (two) times daily as needed (congestion).   rosuvastatin  (CRESTOR ) 40 MG tablet Take 1 tablet (40 mg total) by mouth daily.   tadalafil  (CIALIS ) 20 MG tablet Take 1 tablet (20 mg total) by mouth every other day as needed for erectile dysfunction.   tiZANidine  (ZANAFLEX ) 4 MG tablet Take 1 tablet (4 mg total) by mouth every 8 (eight) hours as needed for muscle spasms.   traZODone  (DESYREL ) 100 MG tablet Take 1 tablet (100 mg total) by mouth at bedtime.   TRELEGY ELLIPTA  100-62.5-25 MCG/ACT AEPB INHALE 1 PUFF ONCE DAILY   No current facility-administered medications on file prior to visit.    Review of Systems  Constitutional:  Negative for activity change, appetite change, chills, diaphoresis, fatigue and fever.  HENT:  Negative for congestion and hearing loss.   Eyes:  Negative for visual disturbance.  Respiratory:  Negative for cough, chest tightness, shortness of breath and wheezing.   Cardiovascular:  Negative for chest pain, palpitations and leg swelling.  Gastrointestinal:  Negative for abdominal pain, constipation, diarrhea, nausea and vomiting.  Genitourinary:  Negative for dysuria, frequency and hematuria.  Musculoskeletal:  Negative for arthralgias and neck pain.  Skin:  Negative for rash.  Neurological:  Negative for dizziness, weakness, light-headedness, numbness and headaches.  Hematological:  Negative for  adenopathy.  Psychiatric/Behavioral:  Negative for behavioral problems, dysphoric mood and sleep disturbance.    Per HPI unless specifically indicated above     Objective:    BP 130/82 (BP Location: Left Arm, Patient Position: Sitting, Cuff Size: Normal)   Pulse 82   Ht 5' 9 (1.753 m)   Wt 170 lb 6 oz (77.3 kg)   SpO2 98%   BMI 25.16 kg/m   Wt Readings from Last 3 Encounters:  06/11/24 170 lb 6 oz (77.3 kg)  06/11/24 170 lb 12.8 oz (77.5 kg)  02/06/24 169 lb 2 oz (76.7 kg)    Physical Exam Vitals and nursing note reviewed.  Constitutional:      General: He is not in acute distress.    Appearance: He is well-developed. He is not diaphoretic.     Comments: Well-appearing, comfortable, cooperative  HENT:     Head: Normocephalic and atraumatic.  Eyes:     General:        Right eye: No discharge.        Left eye: No discharge.     Conjunctiva/sclera: Conjunctivae normal.     Pupils: Pupils are equal, round, and reactive to light.  Neck:     Thyroid: No thyromegaly.  Cardiovascular:     Rate and Rhythm: Normal rate and regular rhythm.     Pulses: Normal pulses.     Heart sounds: Normal heart sounds. No murmur heard. Pulmonary:     Effort: Pulmonary effort is normal. No respiratory distress.     Breath sounds:  Normal breath sounds. No wheezing or rales.  Abdominal:     General: Bowel sounds are normal. There is no distension.     Palpations: Abdomen is soft. There is no mass.     Tenderness: There is no abdominal tenderness.  Musculoskeletal:        General: No tenderness. Normal range of motion.     Cervical back: Normal range of motion and neck supple.     Comments: Upper / Lower Extremities: - Normal muscle tone, strength bilateral upper extremities 5/5, lower extremities 5/5  Lymphadenopathy:     Cervical: No cervical adenopathy.  Skin:    General: Skin is warm and dry.     Findings: No erythema or rash.  Neurological:     Mental Status: He is alert and  oriented to person, place, and time.     Comments: Distal sensation intact to light touch all extremities  Psychiatric:        Mood and Affect: Mood normal.        Behavior: Behavior normal.        Thought Content: Thought content normal.     Comments: Well groomed, good eye contact, normal speech and thoughts     Diabetic Foot Exam - Simple   Simple Foot Form Diabetic Foot exam was performed with the following findings: Yes 06/11/2024  3:15 PM  Visual Inspection No deformities, no ulcerations, no other skin breakdown bilaterally: Yes Sensation Testing See comments: Yes Pulse Check Posterior Tibialis and Dorsalis pulse intact bilaterally: Yes Comments Bilateral feet symmetrical with forefoot great toe reduced sensation to monofilament and heel reduced. Intact dorsal and mid foot. Mild callus over heels.     Results for orders placed or performed in visit on 05/14/24  Comprehensive metabolic panel with GFR   Collection Time: 05/14/24  9:03 AM  Result Value Ref Range   Glucose, Bld 121 (H) 65 - 99 mg/dL   BUN 13 7 - 25 mg/dL   Creat 9.21 9.29 - 8.64 mg/dL   eGFR 899 > OR = 60 fO/fpw/8.26f7   BUN/Creatinine Ratio SEE NOTE: 6 - 22 (calc)   Sodium 136 135 - 146 mmol/L   Potassium 5.0 3.5 - 5.3 mmol/L   Chloride 100 98 - 110 mmol/L   CO2 27 20 - 32 mmol/L   Calcium  9.6 8.6 - 10.3 mg/dL   Total Protein 7.0 6.1 - 8.1 g/dL   Albumin 4.6 3.6 - 5.1 g/dL   Globulin 2.4 1.9 - 3.7 g/dL (calc)   AG Ratio 1.9 1.0 - 2.5 (calc)   Total Bilirubin 0.5 0.2 - 1.2 mg/dL   Alkaline phosphatase (APISO) 52 35 - 144 U/L   AST 19 10 - 35 U/L   ALT 16 9 - 46 U/L  TSH   Collection Time: 05/14/24  9:03 AM  Result Value Ref Range   TSH 0.70 0.40 - 4.50 mIU/L  Microalbumin / creatinine urine ratio   Collection Time: 05/14/24  9:03 AM  Result Value Ref Range   Creatinine, Urine 72 20 - 320 mg/dL   Microalb, Ur 0.7 mg/dL   Microalb Creat Ratio 10 <30 mg/g creat  PSA   Collection Time: 05/14/24   9:03 AM  Result Value Ref Range   PSA 0.91 < OR = 4.00 ng/mL  CBC with Differential/Platelet   Collection Time: 05/14/24  9:03 AM  Result Value Ref Range   WBC 10.4 3.8 - 10.8 Thousand/uL   RBC 4.82 4.20 - 5.80 Million/uL  Hemoglobin 15.0 13.2 - 17.1 g/dL   HCT 55.5 61.4 - 49.9 %   MCV 92.1 80.0 - 100.0 fL   MCH 31.1 27.0 - 33.0 pg   MCHC 33.8 32.0 - 36.0 g/dL   RDW 87.6 88.9 - 84.9 %   Platelets 343 140 - 400 Thousand/uL   MPV 9.2 7.5 - 12.5 fL   Neutro Abs 6,542 1,500 - 7,800 cells/uL   Absolute Lymphocytes 2,912 850 - 3,900 cells/uL   Absolute Monocytes 572 200 - 950 cells/uL   Eosinophils Absolute 270 15 - 500 cells/uL   Basophils Absolute 104 0 - 200 cells/uL   Neutrophils Relative % 62.9 %   Total Lymphocyte 28.0 %   Monocytes Relative 5.5 %   Eosinophils Relative 2.6 %   Basophils Relative 1.0 %  Hemoglobin A1c   Collection Time: 05/14/24  9:03 AM  Result Value Ref Range   Hgb A1c MFr Bld 7.4 (H) <5.7 %   Mean Plasma Glucose 166 mg/dL   eAG (mmol/L) 9.2 mmol/L  Lipid panel   Collection Time: 05/14/24  9:03 AM  Result Value Ref Range   Cholesterol 113 <200 mg/dL   HDL 42 > OR = 40 mg/dL   Triglycerides 843 (H) <150 mg/dL   LDL Cholesterol (Calc) 48 mg/dL (calc)   Total CHOL/HDL Ratio 2.7 <5.0 (calc)   Non-HDL Cholesterol (Calc) 71 <869 mg/dL (calc)      Assessment & Plan:   Problem List Items Addressed This Visit     Anxiety   Background diabetic retinopathy associated with type 2 diabetes mellitus (HCC)   Hyperlipidemia associated with type 2 diabetes mellitus (HCC)   Major depressive disorder, recurrent, moderate (HCC)   Stage 1 mild COPD by GOLD classification (HCC)   Type 2 diabetes mellitus with vascular disease (HCC)   Other Visit Diagnoses       Annual physical exam    -  Primary     Erectile dysfunction, unspecified erectile dysfunction type         Essential hypertension         Coronary artery disease involving native coronary artery of  native heart without angina pectoris         Long term current use of oral hypoglycemic drug         Other diabetic neurological complication associated with type 2 diabetes mellitus (HCC)            Updated Health Maintenance information Reviewed recent lab results with patient Encouraged improvement to lifestyle with diet and exercise Goal of weight loss   Type 2 diabetes mellitus Complications = vascular disease / coronary artery disease / Non proliferative Diabetic Retinopathy / Neuropathy  A1c at 7.4, good control but not optimal. Discussed increasing metformin  to improve A1c. Target A1c is 6-7. - Increase metformin  XR 750mg  to two tablets daily if tolerated. - Continue Jardiance  as prescribed. DM Foot exam Request new DM Eye exam  Coronary artery disease Followed by Cardiology Ranexa  prescribed for angina. Discussed its mechanism and potential side effects. - continue Ranexa  as prescribed for angina.  Hyperlipidemia At goal < 55 On Rosuvastatin  40mg  and fenofibrate  Controlled  Hypertension Controlled On Carvedilol  25mg  TWICE A DAY, Lisinopril -hydrochlorothiazide  10-12.5mg  daily,   Emphysema / Chronic obstructive pulmonary disease Improved management on Trelegy triple therapy inhaler Trelegy inhaler effective. 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 No recent  urology consultation. Discussed treatment options. - Will plan to refer to urologist in [facility] for evaluation and management.  Major depressive disorder, recurrent, moderate Counseling at [facility] not productive. Mood unchanged. Discussed alternative counseling or psychiatric referral. - Consider finding a different counseling service or psychiatric referral if needed.  General health maintenance Pneumonia vaccine completed. Shingles  vaccine discussed. Cologuard negative, next due in three years. Eye exam needed. - Will schedule next Cologuard test in three years. - Find a new eye doctor and schedule an eye exam. - Consider shingles vaccine at pharmacy if interested.       No orders of the defined types were placed in this encounter.   No orders of the defined types were placed in this encounter.    Follow up plan: Return for 6 month DM A1c.  Marsa Officer, DO [facility] 06/11/2024, 2:53 PM

## 2024-06-11 NOTE — Progress Notes (Signed)
 Office Visit    Patient Name: REDMOND WHITTLEY Date of Encounter: 06/11/2024  Primary Care Provider:  Edman Marsa PARAS, DO Primary Cardiologist:  Redell Cave, MD  Cardiology APP:  Vivienne Lonni Ingle, NP   Chief Complaint    63 y.o. male with a history of CAD, aortic atherosclerosis,  HTN, hyperlipidemia, diabetes, tobacco abuse, GERD, hepatic steatosis, and arthritis, who presents for CAD follow-up.  Past Medical History   Subjective   Past Medical History:  Diagnosis Date   Allergy    Aortic atherosclerosis    Arthritis    Asthma    CAD (coronary artery disease)    a. 03/2013 MI/PCI: DES->mLAD; b. 05/2020 MV: mid ant/apical defect/partially reversible; c. 2020/07/04 PCI: LM nl, LAD 30p, 42m ISR, 77m/d (2.75x18 Resolute Onyx DES), 80d (small), Lat D2 99 (small), LCX 30diffuse, RCA 30p/m. EF 55-65%; d. 10/2021 MV: partially rev apical ant defect (scar/peri-infarct isch) - sl less pronounced compared to prior study. Low risk.   Diabetes mellitus, type 2 (HCC)    GERD (gastroesophageal reflux disease)    Hepatic steatosis    a. 07/2023 noted on CT.   History of echocardiogram    a. 05/2020 Echo: EF 60-65%, no rwma, nl RV fxn.   Hyperlipidemia    Tobacco abuse    Vertigo    with sinus issues   Past Surgical History:  Procedure Laterality Date   CARDIAC CATHETERIZATION  2014   stent   CORONARY STENT INTERVENTION  Jul 04, 2013   CORONARY STENT INTERVENTION N/A 06/19/2020   Procedure: CORONARY STENT INTERVENTION;  Surgeon: Darron Deatrice LABOR, MD;  Location: [facility] INVASIVE CV LAB;  Service: Cardiovascular;  Laterality: N/A;   ESOPHAGOGASTRODUODENOSCOPY     HERNIA REPAIR     LEFT HEART CATH AND CORONARY ANGIOGRAPHY Left 06/19/2020   Procedure: LEFT HEART CATH AND CORONARY ANGIOGRAPHY;  Surgeon: Darron Deatrice LABOR, MD;  Location: [facility] INVASIVE CV LAB;  Service: Cardiovascular;  Laterality: Left;    Allergies  Allergies[1]     History of Present Illness      63  y.o. y/o male with a history of CAD, aortic atherosclerosis, HTN, hyperlipidemia, diabetes, tobacco abuse, GERD, hepatic steatosis, and arthritis.  He previously suffered a myocardial infarction in September 2014 underwent drug-eluting stent placement to the mid LAD.  He subsequently transition care to our practice in 07-04-20.  Stress testing in November 2021 was abnormal with mid anterior and apical partially reversible defects.  Diagnostic catheterization revealed 30% stenosis within previously placed stent with new 80% mid to distal LAD stenosis as well as 80% distal/apical LAD disease and 99% lateral D2 disease (small vessel).  The RCA had a 30% proximal to mid stenosis.  The mid to distal LAD was successfully treated with a drug-eluting stent in the apical LAD was managed medically.  Echocardiogram in November 2021 showed an EF of 60 to 65% with normal RV function and no significant valvular disease.   In the setting of exertional chest tightness may have 07-04-22, he underwent repeat stress testing which showed a mild apical anterior partially reversible defect, which was less pronounced compared to 07/04/2020 study.  Overall, study was felt to be low risk and he was medically managed.   Mr. Larose was last seen in cardiology clinic in 04-Jan-2025at which time he was doing well.  Over the past year, notes chronic, relatively stable dyspnea on exertion as well as exertional angina at higher levels of activity.  He says that ever  since his initial event, when he does something such as mowing the lawn, that he will have mild chest tightness.  This symptom has not changed in the past 2-1/2 years but does limit his activity.  With normal activity, he has no significant limitations.  He continues to smoke 2 packs a day and recognizes that this is likely contributing to some of his dyspnea.  He says he is not quite ready to quit.  He has also been experiencing bilateral hip and buttock discomfort after walking about 300  yards, which causes him to slow down or stop prior to resolving in a few minutes.  He denies any changes to lower extremity sensation or hair distribution.  He denies palpitations, PND, orthopnea, dizziness, syncope, edema, or early satiety.  Objective   Home Medications    Current Outpatient Medications  Medication Sig Dispense Refill   albuterol  (VENTOLIN  HFA) 108 (90 Base) MCG/ACT inhaler Inhale 1-2 puffs into the lungs every 6 (six) hours as needed for wheezing or shortness of breath.     aspirin  81 MG tablet Take 81 mg by mouth daily.     carvedilol  (COREG ) 25 MG tablet TAKE 1 TABLET BY MOUTH TWICE DAILY WITH A MEAL 180 tablet 0   clopidogrel  (PLAVIX ) 75 MG tablet Take 1 tablet by mouth once daily 15 tablet 0   DULoxetine  (CYMBALTA ) 60 MG capsule Take 1 capsule (60 mg total) by mouth daily. 90 capsule 3   fenofibrate  160 MG tablet Take 1 tablet by mouth once daily 60 tablet 0   gabapentin  (NEURONTIN ) 600 MG tablet TAKE 1 TABLET BY MOUTH AT BEDTIME (Patient taking differently: Take 600 mg by mouth 2 (two) times daily.) 90 tablet 1   icosapent  Ethyl (VASCEPA ) 1 g capsule Take 2 capsules (2 g total) by mouth 2 (two) times daily. 120 capsule 2   JARDIANCE  25 MG TABS tablet Take 1 tablet (25 mg total) by mouth daily before breakfast. 90 tablet 3   lisinopril -hydrochlorothiazide  (ZESTORETIC ) 10-12.5 MG tablet Take 1 tablet by mouth once daily 90 tablet 0   metFORMIN  (GLUCOPHAGE -XR) 750 MG 24 hr tablet TAKE 1 TABLET BY MOUTH TWICE DAILY WITH A MEAL 180 tablet 0   pantoprazole  (PROTONIX ) 40 MG tablet TAKE 1 TABLET BY MOUTH ONCE DAILY BEFORE BREAKFAST 30 tablet 11   pseudoephedrine -acetaminophen  (TYLENOL  SINUS) 30-500 MG TABS tablet Take 2 tablets by mouth 2 (two) times daily as needed (congestion).     ranolazine  (RANEXA ) 500 MG 12 hr tablet Take 1 tablet (500 mg total) by mouth 2 (two) times daily. 180 tablet 3   rosuvastatin  (CRESTOR ) 40 MG tablet Take 1 tablet (40 mg total) by mouth daily. 90  tablet 3   tadalafil  (CIALIS ) 20 MG tablet Take 1 tablet (20 mg total) by mouth every other day as needed for erectile dysfunction. 90 tablet 3   tiZANidine  (ZANAFLEX ) 4 MG tablet Take 1 tablet (4 mg total) by mouth every 8 (eight) hours as needed for muscle spasms. 90 tablet 5   traZODone  (DESYREL ) 100 MG tablet Take 1 tablet (100 mg total) by mouth at bedtime. 90 tablet 3   TRELEGY ELLIPTA  100-62.5-25 MCG/ACT AEPB INHALE 1 PUFF ONCE DAILY 60 each 0   No current facility-administered medications for this visit.     Physical Exam    VS:  BP 122/62 (BP Location: Left Arm, Patient Position: Sitting, Cuff Size: Normal)   Pulse 68   Ht 5' 9 (1.753 m)   Wt 170 lb 12.8  oz (77.5 kg)   SpO2 99%   BMI 25.22 kg/m  , BMI Body mass index is 25.22 kg/m.          GEN: Well nourished, well developed, in no acute distress. HEENT: normal. Neck: Supple, no JVD, carotid bruits, or masses. Cardiac: RRR, distant, no murmurs, rubs, or gallops. No clubbing, cyanosis, edema.  Radials 2+/PT 1+ and equal bilaterally.  Respiratory:  Respirations regular and unlabored, scattered rhonchi. GI: Soft, nontender, nondistended, BS + x 4. MS: no deformity or atrophy. Skin: warm and dry, no rash. Neuro:  Strength and sensation are intact. Psych: Normal affect.  Accessory Clinical Findings    ECG personally reviewed by me today - EKG Interpretation Date/Time:  Friday June 11 2024 10:03:02 EST Ventricular Rate:  68 PR Interval:  166 QRS Duration:  90 QT Interval:  400 QTC Calculation: 425 R Axis:   -45  Text Interpretation: Normal sinus rhythm Left axis deviation Confirmed by Vivienne Bruckner [phone]) on 06/11/2024 10:08:31 AM  - no acute changes.  Lab Results  Component Value Date   WBC 10.4 05/14/2024   HGB 15.0 05/14/2024   HCT 44.4 05/14/2024   MCV 92.1 05/14/2024   PLT 343 05/14/2024   Lab Results  Component Value Date   CREATININE 0.78 05/14/2024   BUN 13 05/14/2024   NA 136  05/14/2024   K 5.0 05/14/2024   CL 100 05/14/2024   CO2 27 05/14/2024   Lab Results  Component Value Date   ALT 16 05/14/2024   AST 19 05/14/2024   ALKPHOS 53 10/08/2021   BILITOT 0.5 05/14/2024   Lab Results  Component Value Date   CHOL 113 05/14/2024   HDL 42 05/14/2024   LDLCALC 48 05/14/2024   TRIG 156 (H) 05/14/2024   CHOLHDL 2.7 05/14/2024    Lab Results  Component Value Date   HGBA1C 7.4 (H) 05/14/2024   Lab Results  Component Value Date   TSH 0.70 05/14/2024       Assessment & Plan    1.  Coronary artery disease/stable angina: Status post MI and drug-eluting stent placement to the proximal/mid LAD in 2014 with subsequent PCI and drug-eluting stent placement to the mid/distal LAD in 2021.  Low risk stress test in September 2023.  He has chronic relatively stable dyspnea and angina dating back to at least 2021.  Angina usually occurs at higher levels of activity such as mowing the lawn.  Overall, he does not think symptoms have changed much but he would be interested in trying Ranexa  if he can improve his activity tolerance.  Therefore, I have provided prescription for Ranexa  500 mg twice daily.  He otherwise remains on aspirin , beta-blocker, clopidogrel , fibrate, and rosuvastatin  therapy.  He says at this time, he has not taken Vascepa  in about a month but will get back on it as he has an active prescription.  2.  Hyperlipidemia: LDL of 48 with triglycerides of 156 in November 2025.  LFTs normal at that time.  He remains on statin and fibrate.  As above, he ran out of Vascepa  about a month ago and has put off refilling it due to finances but says he will get back on it.  3.  Tobacco abuse: Still smoking 2 packs of cigarettes a day.  Complete cessation advised.  He says that he he is not quite ready to quit.  We discussed the use of nicotine patches versus Chantix.  His wife is supportive of him using nicotine patches.  She tried Chantix before and did not tolerate.   Strongly encouraged him to pick a quit date and let us  know if he would like to try Chantix in the future.  4.  Type 2 diabetes mellitus: A1c 7.4 in November of this year.  Followed by primary care and remains on Jardiance , metformin , ACE inhibitor, and lipid therapy.  5.  Bilateral lower extremity claudication: Over the past year, he has developed bilateral hip and buttock discomfort after walking about 300 yards.  Symptoms resolved with rest.  No changes to lower extremity sensation, hair distribution, or evidence of skin breakdown.  He has weak bilateral distal pulses.  Follow-up ABIs.  Continue lipid and antiplatelet therapy.  6.  Preoperative cardiovascular examination: Patient pending 16 tooth extractions to be performed with local anesthesia and conscious sedation.  Advised that he is low risk for pending procedure and may proceed without additional testing.  He may hold clopidogrel  for 5 days prior to the procedure and I recommend continuation of aspirin  throughout the perioperative period.  7.  Disposition: Follow-up ABIs.  Follow-up in 3 months or sooner if necessary.  Lonni Meager, NP 06/11/2024, 10:45 AM     [1]  Allergies Allergen Reactions   Crestor  [Rosuvastatin ]     Muscle pain

## 2024-06-11 NOTE — Patient Instructions (Addendum)
 Thank you for coming to the office today.  Recommend Diabetic Eye Exam Contact if you need assistance.  Future Shingles vaccine  Referral to Urologist  [facility] -1st floor [address] [place],  [place]  [address] Phone: [phone]  Recent Labs    09/26/23 1438 02/06/24 1438 05/14/24 0903  HGBA1C 7.8* 7.4* 7.4*   If you want to pursue other mental health referral I can send to Psychiatry or other therapist.  Please schedule a Follow-up Appointment to: Return for 6 month DM A1c.  If you have any other questions or concerns, please feel free to call the office or send a message through MyChart. You may also schedule an earlier appointment if necessary.  Additionally, you may be receiving a survey about your experience at our office within a few days to 1 week by e-mail or mail. We value your feedback.  Marsa Officer, DO [facility], [place]

## 2024-06-11 NOTE — Patient Instructions (Addendum)
 Medication Instructions:  Your physician recommends the following medication changes.  START TAKING: Ranexa  500 mg twice daily  *If you need a refill on your cardiac medications before your next appointment, please call your pharmacy*  Testing/Procedures: Your physician has requested that you have an ankle brachial index (ABI). During this test an ultrasound and blood pressure cuff are used to evaluate the arteries that supply the arms and legs with blood.  Allow thirty minutes for this exam.  There are no restrictions or special instructions.  This will take place at [address] [facility]) [address], [place] [address]  Please note: We ask at that you not bring children with you during ultrasound (echo/ vascular) testing. Due to room size and safety concerns, children are not allowed in the ultrasound rooms during exams. Our front office staff cannot provide observation of children in our lobby area while testing is being conducted. An adult accompanying a patient to their appointment will only be allowed in the ultrasound room at the discretion of the ultrasound technician under special circumstances. We apologize for any inconvenience.   Follow-Up: At [facility], you and your health needs are our priority.  As part of our continuing mission to provide you with exceptional heart care, our providers are all part of one team.  This team includes your primary Cardiologist (physician) and Advanced Practice Providers or APPs (Physician Assistants and Nurse Practitioners) who all work together to provide you with the care you need, when you need it.  Your next appointment:   3 month(s)  Provider:   You may see Redell Cave, MD or Lonni Meager, NP   We recommend signing up for the patient portal called MyChart.  Sign up information is provided on this After Visit Summary.  MyChart is used to connect with patients for Virtual Visits (Telemedicine).  Patients  are able to view lab/test results, encounter notes, upcoming appointments, etc.  Non-urgent messages can be sent to your provider as well.   To learn more about what you can do with MyChart, go to [email or web address].

## 2024-06-11 NOTE — Telephone Encounter (Signed)
° °  Pharmacy Patient Advocate Encounter   Received notification from CoverMyMeds that prior authorization for ranolazine  is required/requested.   Insurance verification completed.   The patient is insured through [facility].   Per test claim: PA required; PA submitted to above mentioned insurance via Latent Key/confirmation #/EOC [id number] Status is pending

## 2024-06-14 NOTE — Telephone Encounter (Signed)
 Pharmacy Patient Advocate Encounter  Received notification from [facility] that Prior Authorization for ranolazine  has been APPROVED from 06/11/24 to 06/11/25   PA #/Case ID/Reference #: [id number]

## 2024-06-16 ENCOUNTER — Telehealth: Payer: Self-pay | Admitting: Cardiology

## 2024-06-16 NOTE — Telephone Encounter (Signed)
 Called patient. Told him that this form was already filled by a NP in which he told me that the dentist is requesting the paperwork be filled out my an MD. Dr Darliss is the MD of the patient and he will be returning on Monday. Patient aware. Will get patient to request a new form to be faxed over to our office so that Dr Darliss can fill out the needed paperwork. Patient verbalized understanding and will call the dentist to get them to fax over new paperwork.

## 2024-06-16 NOTE — Telephone Encounter (Signed)
 Pt came in need more in depth instructions such as when to stop and start Epinephrine and DR. Agbor needs to sign   If the papers could be faxed and he would like a call as well

## 2024-06-22 LAB — GENECONNECT MOLECULAR SCREEN: Genetic Analysis Overall Interpretation: NEGATIVE

## 2024-06-25 ENCOUNTER — Other Ambulatory Visit: Payer: Self-pay | Admitting: Cardiology

## 2024-06-26 ENCOUNTER — Other Ambulatory Visit: Payer: Self-pay | Admitting: Family Medicine

## 2024-06-26 DIAGNOSIS — K219 Gastro-esophageal reflux disease without esophagitis: Secondary | ICD-10-CM

## 2024-06-28 NOTE — Telephone Encounter (Signed)
 Requested Prescriptions  Pending Prescriptions Disp Refills   pantoprazole  (PROTONIX ) 40 MG tablet [Pharmacy Med Name: Pantoprazole  Sodium 40 MG Oral Tablet Delayed Release] 90 tablet 3    Sig: TAKE 1 TABLET BY MOUTH ONCE DAILY BEFORE BREAKFAST     Gastroenterology: Proton Pump Inhibitors Passed - 06/28/2024  4:28 PM      Passed - Valid encounter within last 12 months    Recent Outpatient Visits           2 weeks ago Annual physical exam   [facility], Marsa PARAS, DO   4 months ago Type 2 diabetes mellitus with other specified complication, without long-term current use of insulin [facility])   [place] [facility], Marsa PARAS, DO   9 months ago Type 2 diabetes mellitus with other specified complication, without long-term current use of insulin [facility])   [facility] [place] Marsa PARAS, DO       Future Appointments             In 2 months Agbor-Etang, Redell, MD [facility]

## 2024-07-07 ENCOUNTER — Telehealth: Payer: Self-pay

## 2024-07-07 DIAGNOSIS — F419 Anxiety disorder, unspecified: Secondary | ICD-10-CM

## 2024-07-07 MED ORDER — DIAZEPAM 5 MG PO TABS: ORAL_TABLET | ORAL | 0 refills | Status: AC

## 2024-07-07 NOTE — Telephone Encounter (Signed)
 Copied from CRM #[id number]. Topic: Clinical - Medication Question >> Jul 07, 2024  9:04 AM Berneda FALCON wrote: Reason for CRM: Patient is having all his teeth pulled on 07/26/24 and requested dentist prescribe valium  for anxiety and they refused and said to contact PCP for this. Patient is NOT currently having anxiety, he just wants the medication for the upcoming procedure if permitted.    If so, the preferred pharmacy is: [facility] [address]), [place] - [address] ([address]) [place] [address] Phone: [phone] Fax: [phone] Hours: Not open 24 hours

## 2024-07-07 NOTE — Addendum Note (Signed)
 Addended by: EDMAN MARSA PARAS on: 07/07/2024 05:43 PM   Modules accepted: Orders

## 2024-07-07 NOTE — Telephone Encounter (Signed)
 Rx Diazepam  (generic Valium ) sent to his pharmacy with instructions. Please remind him that he should not drive while taking the Valium  due to sedation risks.  Marsa Officer, DO [facility] 07/07/2024, 5:43 PM

## 2024-07-08 NOTE — Telephone Encounter (Signed)
 Spoke to patient, notified prescription sent to pharmacy. And he should not drive while taking and to follow the instructions.

## 2024-07-09 ENCOUNTER — Ambulatory Visit: Payer: Self-pay | Attending: Nurse Practitioner

## 2024-07-09 DIAGNOSIS — I739 Peripheral vascular disease, unspecified: Secondary | ICD-10-CM

## 2024-07-10 LAB — VAS US ABI WITH/WO TBI
Left ABI: 1.24
Right ABI: 1.18

## 2024-07-12 ENCOUNTER — Ambulatory Visit: Payer: Self-pay | Admitting: Nurse Practitioner

## 2024-07-12 DIAGNOSIS — I739 Peripheral vascular disease, unspecified: Secondary | ICD-10-CM

## 2024-07-13 ENCOUNTER — Other Ambulatory Visit (HOSPITAL_COMMUNITY): Payer: Self-pay

## 2024-07-16 ENCOUNTER — Other Ambulatory Visit (HOSPITAL_COMMUNITY): Payer: Self-pay

## 2024-07-16 ENCOUNTER — Telehealth: Payer: Self-pay

## 2024-07-16 NOTE — Telephone Encounter (Signed)
 Pharmacy Patient Advocate Encounter   Received notification from [facility] KEY that prior authorization for Vascepa  1 gm is required/requested.   Insurance verification completed.   The patient is insured through [employer].   Per test claim: PA required; PA submitted to above mentioned insurance via Latent Key/confirmation #/EOC [facility] Status is pending

## 2024-07-21 NOTE — Progress Notes (Unsigned)
 "  07/21/24 7:37 AM   Clifford Morse 11/27/1960 [id number]   HPI: 64 y.o. male here for initial evaluation of ED  History of CAD s/p stent (2014, 202), DM2, HTN, tobacco use, peripheral vascular disease with claudication   PMH: Past Medical History:  Diagnosis Date   Allergy    Aortic atherosclerosis    Arthritis    Asthma    CAD (coronary artery disease)    a. 03/2013 MI/PCI: DES->mLAD; b. 05/2020 MV: mid ant/apical defect/partially reversible; c. 05/2020 PCI: LM nl, LAD 30p, 42m ISR, 39m/d (2.75x18 Resolute Onyx DES), 80d (small), Lat D2 99 (small), LCX 30diffuse, RCA 30p/m. EF 55-65%; d. 10/2021 MV: partially rev apical ant defect (scar/peri-infarct isch) - sl less pronounced compared to prior study. Low risk.   Diabetes mellitus, type 2 (HCC)    GERD (gastroesophageal reflux disease)    Hepatic steatosis    a. 07/2023 noted on CT.   History of echocardiogram    a. 05/2020 Echo: EF 60-65%, no rwma, nl RV fxn.   Hyperlipidemia    Intermittent claudication    Primary hypertension    Tobacco abuse    Vertigo    with sinus issues    Surgical History: Past Surgical History:  Procedure Laterality Date   CARDIAC CATHETERIZATION  2014   stent   CORONARY STENT INTERVENTION  2014   CORONARY STENT INTERVENTION N/A 06/19/2020   Procedure: CORONARY STENT INTERVENTION;  Surgeon: Darron Deatrice LABOR, MD;  Location: [facility] INVASIVE CV LAB;  Service: Cardiovascular;  Laterality: N/A;   ESOPHAGOGASTRODUODENOSCOPY     HERNIA REPAIR     LEFT HEART CATH AND CORONARY ANGIOGRAPHY Left 06/19/2020   Procedure: LEFT HEART CATH AND CORONARY ANGIOGRAPHY;  Surgeon: Darron Deatrice LABOR, MD;  Location: [facility] INVASIVE CV LAB;  Service: Cardiovascular;  Laterality: Left;    Family History: Family History  Problem Relation Age of Onset   Emphysema Father    Diabetes Mother     Social History:  reports that he has been smoking cigarettes. He has a 80 pack-year smoking history. He has quit using  smokeless tobacco. He reports current alcohol use. He reports that he does not use drugs.      Physical Exam: There were no vitals taken for this visit.   Constitutional:  Alert and oriented, No acute distress. Cardiovascular: No clubbing, cyanosis, or edema. Respiratory: Normal respiratory effort, no increased work of breathing. GI: Nondistended GU: *** Skin: No rashes, bruises or suspicious lesions. Neurologic: Grossly intact, no focal deficits, moving all 4 extremities. Psychiatric: Normal mood and affect.  Laboratory Data: Component Ref Range & Units (hover) 2 mo ago (05/14/24) 5 mo ago (02/06/24) 9 mo ago (09/26/23) 1 yr ago (05/23/23) 1 yr ago (10/07/22) 2 yr ago (04/26/22) 3 yr ago (04/06/21)  Hgb A1c MFr Bld 7.4 High  7.4 Abnormal  R 7.8 Abnormal  R 10.7 High  R, CM 8.2 Abnormal  R 10.7 High  R, CM 6.8 High  R           Component Ref Range & Units (hover) 2 mo ago 1 yr ago 2 yr ago 3 yr ago 4 yr ago 5 yr ago  PSA 0.91 0.67 CM 0.70 CM 0.51 CM 0.46 R, CM 0.6 R, CM  Comment: The total PSA value from this assay system is     Pertinent Imaging: N/A    Assessment & Plan:    There are no diagnoses linked to this encounter.  Penne Skye, MD 07/21/2024  [facility] [address], [address] [place], [place] [address] [phone] "

## 2024-07-26 ENCOUNTER — Ambulatory Visit: Payer: Self-pay | Admitting: Urology

## 2024-08-06 ENCOUNTER — Ambulatory Visit: Payer: Self-pay | Admitting: Urology

## 2024-08-20 ENCOUNTER — Ambulatory Visit

## 2024-09-17 ENCOUNTER — Ambulatory Visit: Payer: Self-pay | Admitting: Cardiology

## 2024-12-10 ENCOUNTER — Ambulatory Visit: Admitting: Family Medicine
